# Patient Record
Sex: Female | Born: 1980 | Race: White | Hispanic: No | Marital: Single | Smoking: Never smoker
Health system: Southern US, Community
[De-identification: ages and names within clinical notes are randomized; demographics above are authoritative.]

## PROBLEM LIST (undated history)

## (undated) DIAGNOSIS — R600 Localized edema: Secondary | ICD-10-CM

## (undated) DIAGNOSIS — E039 Hypothyroidism, unspecified: Secondary | ICD-10-CM

## (undated) DIAGNOSIS — D649 Anemia, unspecified: Secondary | ICD-10-CM

## (undated) DIAGNOSIS — M549 Dorsalgia, unspecified: Secondary | ICD-10-CM

## (undated) DIAGNOSIS — E538 Deficiency of other specified B group vitamins: Secondary | ICD-10-CM

## (undated) DIAGNOSIS — E559 Vitamin D deficiency, unspecified: Secondary | ICD-10-CM

## (undated) HISTORY — DX: Dorsalgia, unspecified: M54.9

## (undated) HISTORY — PX: OTHER SURGICAL HISTORY: SHX169

## (undated) HISTORY — DX: Deficiency of other specified B group vitamins: E53.8

## (undated) HISTORY — DX: Vitamin D deficiency, unspecified: E55.9

## (undated) HISTORY — DX: Anemia, unspecified: D64.9

## (undated) HISTORY — DX: Hypothyroidism, unspecified: E03.9

## (undated) HISTORY — DX: Localized edema: R60.0

---

## 2002-08-18 ENCOUNTER — Emergency Department (HOSPITAL_COMMUNITY): Admission: EM | Admit: 2002-08-18 | Discharge: 2002-08-18 | Payer: Self-pay

## 2002-08-30 ENCOUNTER — Encounter: Payer: Self-pay | Admitting: Family Medicine

## 2002-08-30 ENCOUNTER — Ambulatory Visit (HOSPITAL_COMMUNITY): Admission: RE | Admit: 2002-08-30 | Discharge: 2002-08-30 | Payer: Self-pay | Admitting: Family Medicine

## 2005-06-23 ENCOUNTER — Encounter (INDEPENDENT_AMBULATORY_CARE_PROVIDER_SITE_OTHER): Payer: Self-pay | Admitting: Specialist

## 2005-06-23 ENCOUNTER — Other Ambulatory Visit: Admission: RE | Admit: 2005-06-23 | Discharge: 2005-06-23 | Payer: Self-pay | Admitting: Interventional Radiology

## 2005-06-23 ENCOUNTER — Encounter: Admission: RE | Admit: 2005-06-23 | Discharge: 2005-06-23 | Payer: Self-pay | Admitting: General Surgery

## 2006-03-18 ENCOUNTER — Ambulatory Visit (HOSPITAL_COMMUNITY): Admission: RE | Admit: 2006-03-18 | Discharge: 2006-03-19 | Payer: Self-pay | Admitting: General Surgery

## 2006-03-18 ENCOUNTER — Encounter (INDEPENDENT_AMBULATORY_CARE_PROVIDER_SITE_OTHER): Payer: Self-pay | Admitting: *Deleted

## 2007-10-29 IMAGING — CR DG CHEST 2V
2 series · 2 of 2 positions shown · non-contrast
Comparison: None.

CLINICAL DATA: Pre-op for thyroid surgery. 
 CHEST - 2 VIEW:

[view not recorded (1 of 2)]
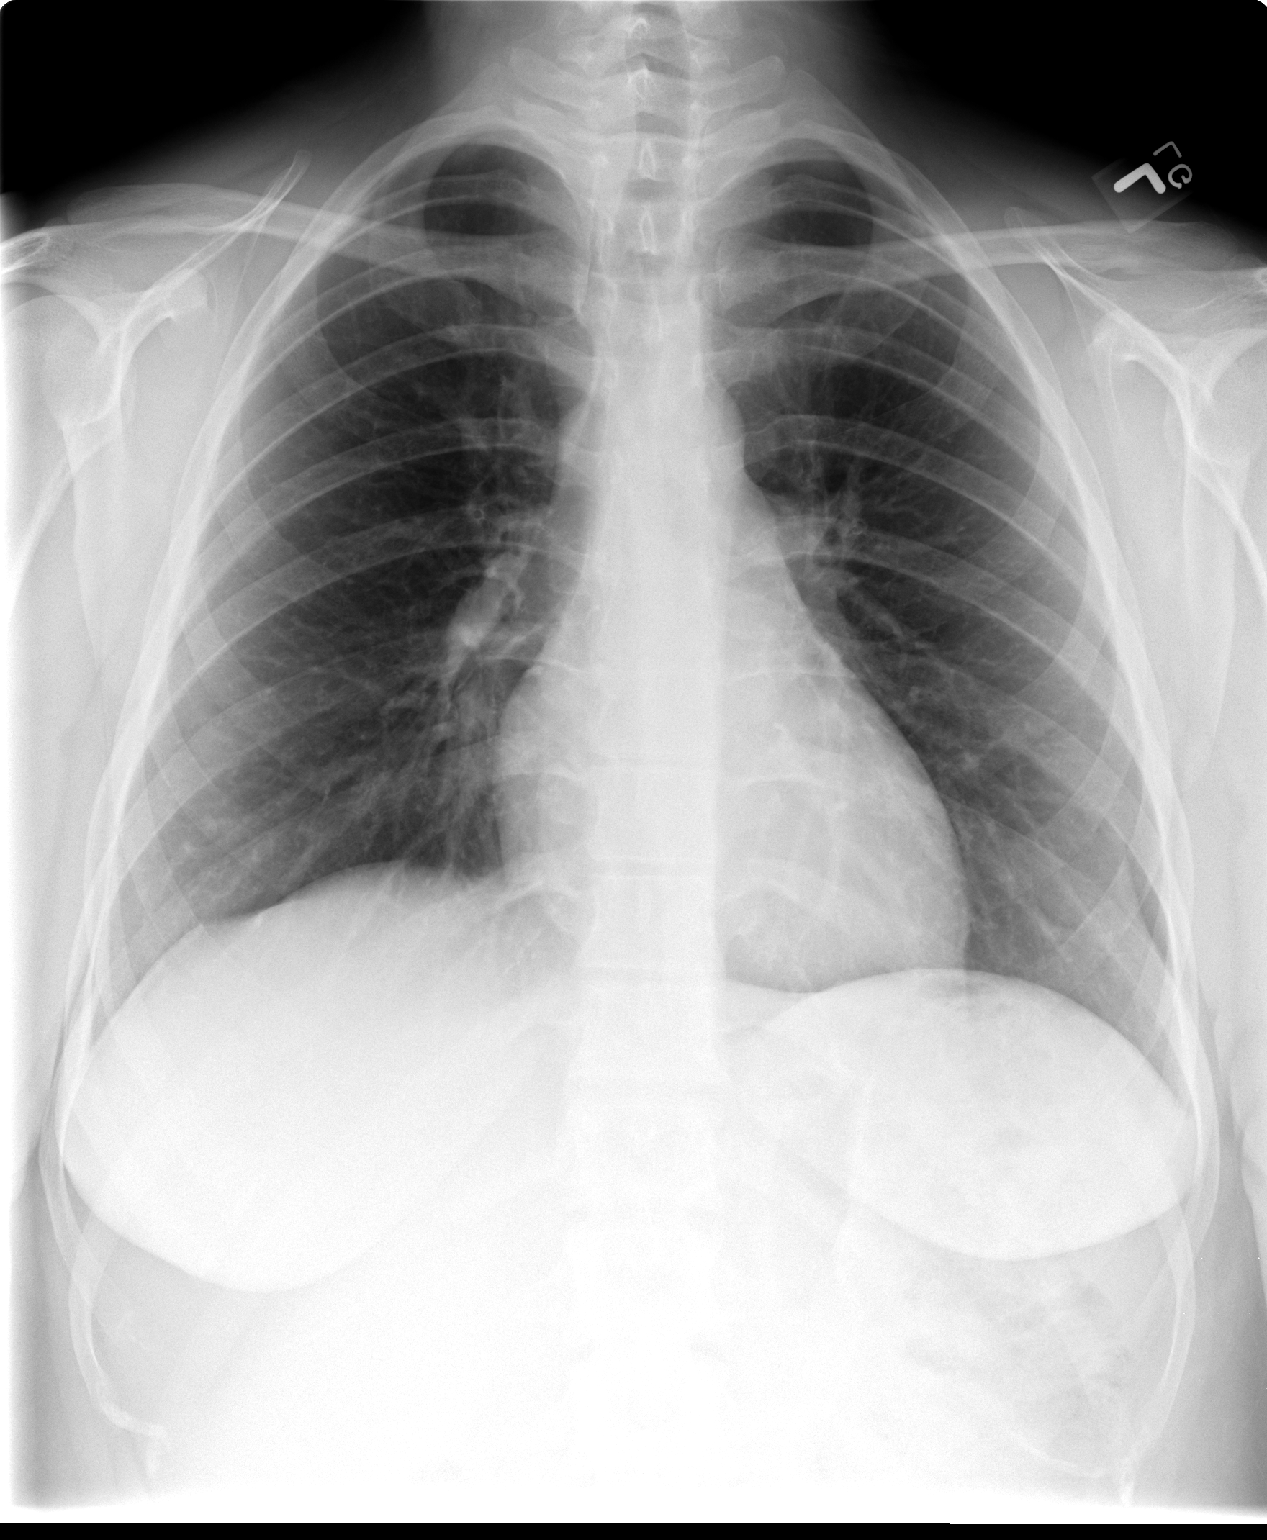

[view not recorded (2 of 2)]
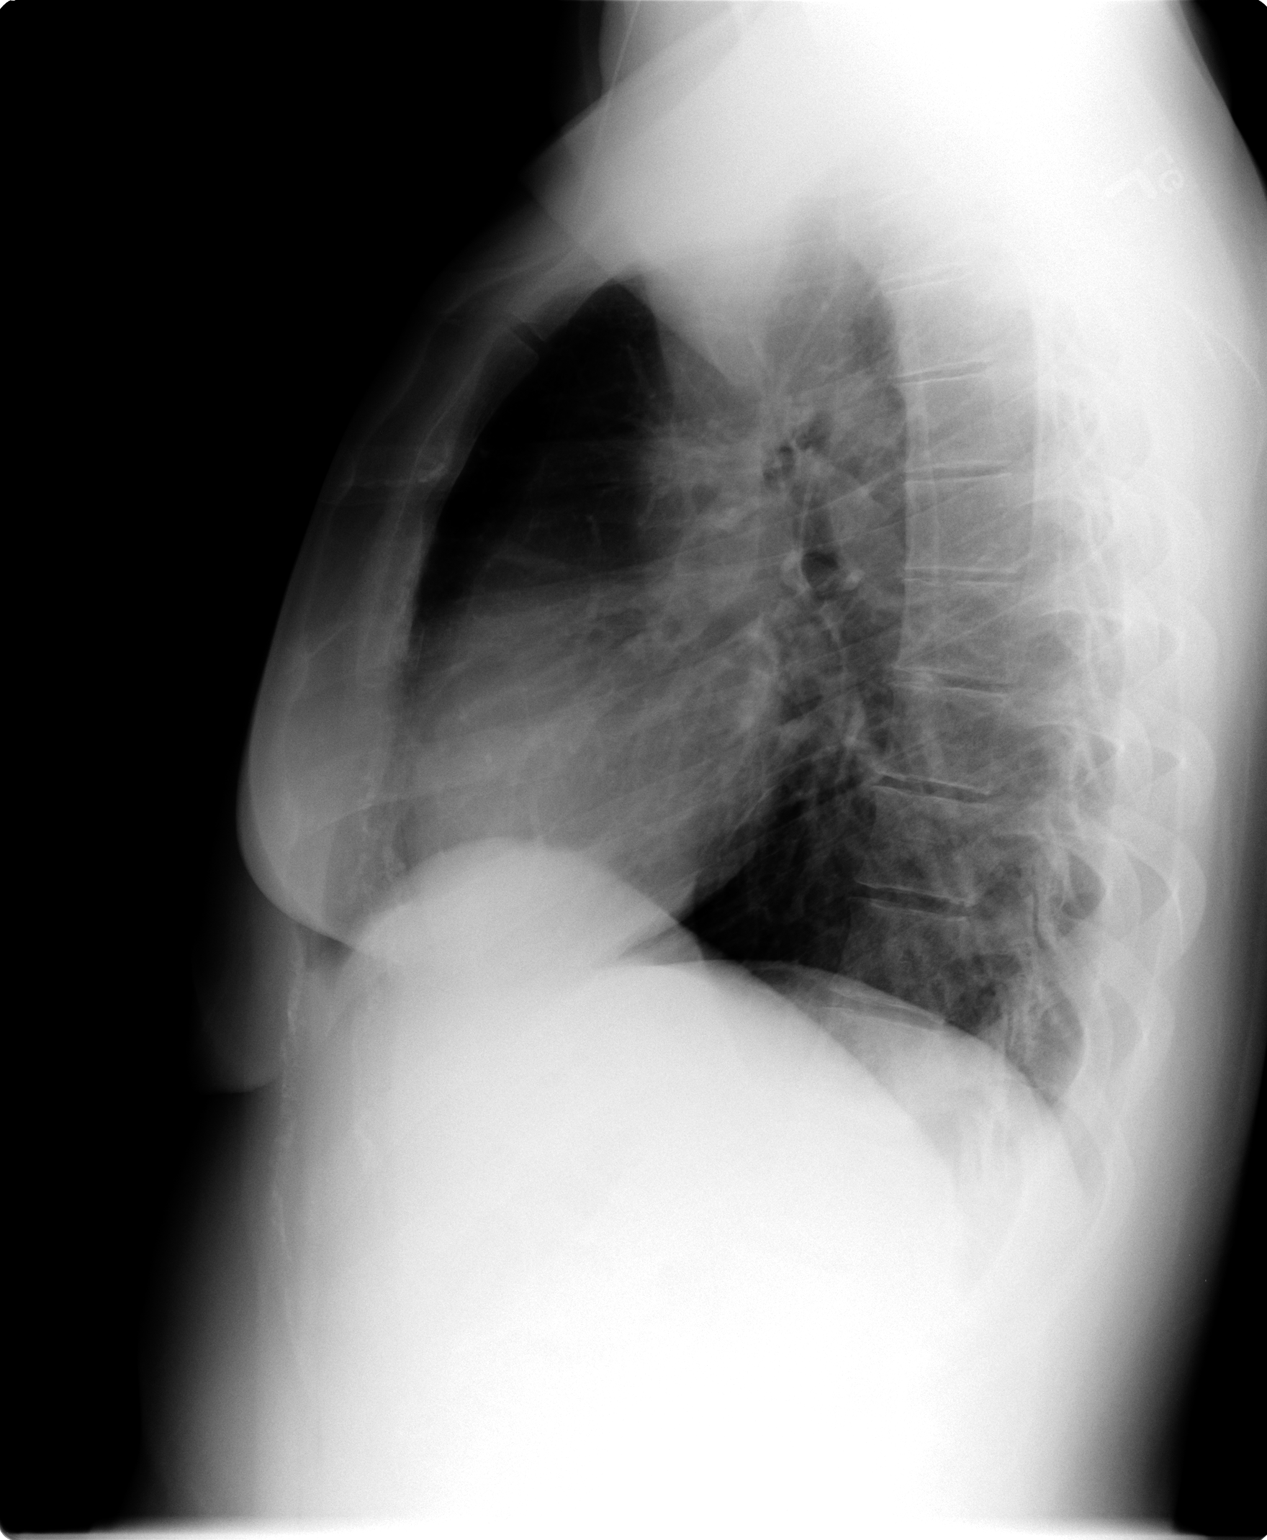

[2 of 2 positions shown; findings below may reference images not displayed]

FINDINGS: The heart size and mediastinal contours are within normal limits.  Both lungs are clear.  The visualized skeletal structures are unremarkable.
IMPRESSION: No active cardiopulmonary disease.

## 2008-03-21 ENCOUNTER — Encounter (HOSPITAL_COMMUNITY): Admission: RE | Admit: 2008-03-21 | Discharge: 2008-05-14 | Payer: Self-pay | Admitting: Family Medicine

## 2008-03-25 ENCOUNTER — Emergency Department (HOSPITAL_COMMUNITY): Admission: EM | Admit: 2008-03-25 | Discharge: 2008-03-25 | Payer: Self-pay | Admitting: Emergency Medicine

## 2010-12-31 ENCOUNTER — Encounter (HOSPITAL_COMMUNITY): Payer: BC Managed Care – PPO | Attending: Family Medicine

## 2010-12-31 DIAGNOSIS — D509 Iron deficiency anemia, unspecified: Secondary | ICD-10-CM | POA: Insufficient documentation

## 2011-02-07 NOTE — Op Note (Signed)
NAME:  Isabella Larson, KNISLEY NO.:  [id number]   MEDICAL RECORD NO.:  [id number]          PATIENT TYPE:  OIB   LOCATION:  1302                         FACILITY:  [facility]   PHYSICIAN:  Ollen Gross. Vernell Morgans, M.D. DATE OF BIRTH:  1981/05/03   DATE OF PROCEDURE:  03/18/2006  DATE OF DISCHARGE:  03/19/2006                                 OPERATIVE REPORT   PREOPERATIVE DIAGNOSIS:  Bilateral thyroid nodules.   POSTOPERATIVE DIAGNOSIS:  Bilateral thyroid nodules.   PROCEDURE:  Total thyroidectomy.   SURGEON:  Dr. Chevis Pretty.   ASSISTANT:  Dr. Harriette Bouillon.   ANESTHESIA:  General endotracheal.   PROCEDURE:  After informed consent was obtained, the patient was brought to  the operating room, placed in the supine position on the operating room  table.  After adequate induction of general anesthesia, a roll was placed  behind the patient's shoulders to extend the neck slightly, the patient's  neck and chest area were then prepped with Betadine and draped in the usual  sterile manner.  A low transverse incision was then made approximately two  fingerbreadth's above the sternal notch.  This incision was carried down  through the skin, subcutaneous tissue and platysma sharply with  electrocautery.  The platysma was then grasped with Allis clamps, both  superiorly and inferiorly and subplatysmal flaps were created.  Once this  was accomplished, the Mahorner retractor was deployed, dissection was then  carried down along the midline in between the strap muscles until the  trachea and thyroid gland were identified.  The patient had a very large  nodule on the right thyroid gland that was displacing the trachea to the  left. The strap muscles were then retracted laterally and attention was  first turned to the right side. As a strap muscles were retracted laterally,  blunt dissection on the anterior surface of the thyroid gland was done with  a Kitner. The edge of the thyroid gland was  then gradually rolled anteriorly  and medially. As this was done, the small vessels on the lateral aspect of  thyroid gland were controlled with small vascular clips and divided.  This  allowed the lateral edge of the thyroid gland be rolled up and over. Next  attention was turned to the superior pole of the thyroid gland. The superior  pole was dissected out bluntly with a small right-angle clamp. The superior  pole of the thyroid gland was controlled with 2-0 silk ties and a medium  clip on the stay side.  This was done and in approximately 2or 3 different  bites. Once this was accomplished, the superior pole was able to be divided  and brought down inferiorly. As the lateral edge of the thyroid gland was  then rolled up out over medially, at least one of the parathyroid glands on  the right side was identified and dissection was carried out to spare this  parathyroid gland. The recurrent laryngeal nerve on the right was also  identified and care was taken to keep this away from the dissection. Once  the section had rolled the thyroid  gland away from the recurrent laryngeal  nerve, the thyroid gland was then dissected off the trachea sharply with  electrocautery. At this point, the operative bed was packed with gauze and  attention was then turned to the left thyroid gland. Again the strap muscles  were retracted laterally with a [employer] retractor.  Blunt dissection was  carried out on the anterior surface of the thyroid gland.  At the edge of  the thyroid gland, several small vessels were identified and controlled with  small vascular clips.  This allowed the edge of thyroid gland to be rolled  anteriorly and medially. The dissection was carried out on the capsule of  the thyroid gland. The superior pole the left lobe was also dissected out  bluntly with a right-angle clamp and controlled with several passes of 2-0  silk ties and medium vascular clips on the stay side.  Once this was   accomplished, the superior pole was able to be divided and brought  inferiorly. As the gland was also rolled more medially, the recurrent  laryngeal nerve on the left was identified and care was taken to keep the  dissection away from this.  Once the thyroid gland was rolled medially off  and away from the recurrent laryngeal nerve, the thyroid gland was dissected  off the trachea sharply with electrocautery and this freed up the thyroid  gland and the thyroid gland was able to be removed. It was marked with a  stitch on the right superior pole and sent to pathology. The operative beds  were examined again and found to be hemostatic and the wound was irrigated  with copious amounts of saline. The area of the parathyroid glands was  identified but no parathyroid glands were seen since the dissection was  carried out on the capsule of the thyroid gland.  It was felt that these  parathyroid glands were spared. Two small pieces of Surgicel were placed on  each side in the operative bed. The strap muscles were then reapproximated  along the midline with interrupted 3-0 Vicryl stitches.  This platysma was  also reapproximated with interrupted 3-0 Vicryl stitches and the skin was  closed with a running 4-0 Monocryl subcuticular stitch.  Benzoin, Steri-  Strips and sterile dressings were applied.  The patient tolerated the  procedure well. At the end of the case, all needle, sponge and instrument  counts were correct.  The patient was then awakened and taken to recovery in  stable condition.      Ollen Gross. Vernell Morgans, M.D.  Electronically Signed     PST/MEDQ  D:  03/20/2006  T:  03/21/2006  Job:  [id number]

## 2013-07-22 ENCOUNTER — Other Ambulatory Visit: Payer: Self-pay | Admitting: Family Medicine

## 2013-07-28 ENCOUNTER — Ambulatory Visit (HOSPITAL_COMMUNITY)
Admission: RE | Admit: 2013-07-28 | Discharge: 2013-07-28 | Disposition: A | Discharge status: Home / Self Care | Payer: BC Managed Care – PPO | Source: Ambulatory Visit | Attending: Family Medicine | Admitting: Family Medicine

## 2013-07-28 DIAGNOSIS — D649 Anemia, unspecified: Secondary | ICD-10-CM | POA: Insufficient documentation

## 2013-07-28 MED ORDER — SODIUM CHLORIDE 0.9 % IV SOLN
25.0000 mg | Freq: Once | INTRAVENOUS | Status: AC
  Administered 2013-07-28: 25 mg via INTRAVENOUS
  Filled 2013-07-28: qty 0.5

## 2013-07-28 MED ORDER — SODIUM CHLORIDE 0.9 % IV SOLN
1000.0000 mg | Freq: Once | INTRAVENOUS | Status: AC
  Administered 2013-07-28: 1000 mg via INTRAVENOUS
  Filled 2013-07-28: qty 20

## 2013-08-31 ENCOUNTER — Telehealth: Payer: Self-pay | Admitting: Internal Medicine

## 2013-08-31 NOTE — Telephone Encounter (Signed)
LVOM FOR PT TO RETURN CALL IN RE TO REFERRAL.  °

## 2013-09-05 ENCOUNTER — Telehealth: Payer: Self-pay | Admitting: Oncology

## 2013-09-05 NOTE — Telephone Encounter (Signed)
S/W PT AND GVE NP APPT 12/23 @ 10:30 W/DR. LIVESAY REFERRING DR. Sharlot Gowda HARRIS DX- IDA WELCOME PACKET MAILED.

## 2013-09-05 NOTE — Telephone Encounter (Signed)
C/D 09/05/13 for appt. 09/13/13

## 2013-09-12 ENCOUNTER — Other Ambulatory Visit: Payer: Self-pay | Admitting: Oncology

## 2013-09-12 DIAGNOSIS — D509 Iron deficiency anemia, unspecified: Secondary | ICD-10-CM

## 2013-09-12 DIAGNOSIS — D649 Anemia, unspecified: Secondary | ICD-10-CM

## 2013-09-13 ENCOUNTER — Telehealth: Payer: Self-pay | Admitting: Oncology

## 2013-09-13 ENCOUNTER — Other Ambulatory Visit (HOSPITAL_BASED_OUTPATIENT_CLINIC_OR_DEPARTMENT_OTHER): Payer: BC Managed Care – PPO

## 2013-09-13 ENCOUNTER — Ambulatory Visit (HOSPITAL_BASED_OUTPATIENT_CLINIC_OR_DEPARTMENT_OTHER): Payer: BC Managed Care – PPO | Admitting: Oncology

## 2013-09-13 ENCOUNTER — Ambulatory Visit: Payer: BC Managed Care – PPO

## 2013-09-13 VITALS — BP 118/81 | HR 66 | Temp 97.9°F | Resp 19 | Ht 69.0 in | Wt 246.0 lb

## 2013-09-13 DIAGNOSIS — Z803 Family history of malignant neoplasm of breast: Secondary | ICD-10-CM

## 2013-09-13 DIAGNOSIS — Z9884 Bariatric surgery status: Secondary | ICD-10-CM

## 2013-09-13 DIAGNOSIS — D509 Iron deficiency anemia, unspecified: Secondary | ICD-10-CM

## 2013-09-13 DIAGNOSIS — D508 Other iron deficiency anemias: Secondary | ICD-10-CM | POA: Insufficient documentation

## 2013-09-13 DIAGNOSIS — D649 Anemia, unspecified: Secondary | ICD-10-CM

## 2013-09-13 DIAGNOSIS — E538 Deficiency of other specified B group vitamins: Secondary | ICD-10-CM | POA: Insufficient documentation

## 2013-09-13 LAB — CBC & DIFF AND RETIC
BASO%: 0.4 % (ref 0.0–2.0)
Basophils Absolute: 0 10*3/uL (ref 0.0–0.1)
EOS%: 1.2 % (ref 0.0–7.0)
HCT: 35.3 % (ref 34.8–46.6)
HGB: 11 g/dL — ABNORMAL LOW (ref 11.6–15.9)
LYMPH%: 33.2 % (ref 14.0–49.7)
MCH: 25.9 pg (ref 25.1–34.0)
MCHC: 31.2 g/dL — ABNORMAL LOW (ref 31.5–36.0)
MCV: 83.1 fL (ref 79.5–101.0)
NEUT%: 49 % (ref 38.4–76.8)
Platelets: 246 10*3/uL (ref 145–400)
RDW: 20.1 % — ABNORMAL HIGH (ref 11.2–14.5)
Retic Ct Abs: 36.13 10*3/uL (ref 33.70–90.70)
lymph#: 0.8 10*3/uL — ABNORMAL LOW (ref 0.9–3.3)

## 2013-09-13 LAB — MORPHOLOGY

## 2013-09-13 LAB — FOLATE: Folate: >20 ng/mL

## 2013-09-13 NOTE — Patient Instructions (Signed)
Call if questions or problems prior to next visit, including worse fatigue or fever.  [phone]   Hemocyte (ferrous fumarate) prescription will go to [facility]. Take one hour before or 2 hrs after eating, on empty stomach with OJ best

## 2013-09-13 NOTE — Progress Notes (Signed)
[facility] NEW PATIENT EVALUATION   Name: Isabella Larson Date: 09/13/2013 MRN: [id number] DOB: Oct 21, 1980  REFERRING PHYSICIAN: Johny Blamer, MD Cc Huel Cote PA (nutritionist at [facility])   REASON FOR REFERRAL: iron deficiency anemia   HISTORY OF PRESENT ILLNESS:Nicholette JAHLIYAH Larson is a 32 y.o. female who is seen in consultation, alone for visit today, at the request of  PCP Dr Holley Bouche, for recurrent iron deficiency anemia. History is from Dr Tiburcio Pea' office information, which I have reviewed and which will be scanned into this EMR, and from patient now.   She was first found to be iron deficient several years after gastric bypass, with IV iron given ~ 4-5 years ago, again ~ 2-3 years ago and most recently IV iron dextran (Infed) 1000 mg on 07-28-2013. Only known bleeding is regular menses, which do not seem excessively heavy per patient. PMH is significant for gastric bypass surgery for obesity in 2005; she has been on monthly B12 injections for past 5-6 years, which she understands that she will need to continue indefinitely. She has been unable to tolerate oral iron, probably as ferrous sulfate, in past due to GI upset.  Patient previously felt extremely fatigued when she had significant iron deficiency anemia, which improved after IV iron administrations. 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 She describes the fatigue as if she could fall asleep while standing and teaching; she does not notice SOB/ DOE or chest pain or extremity symptoms.  She saw Dr Tiburcio Pea in Oct 2014, with hemoglobin 9.5, MCV 72, ferritin 4 and %sat 12. She has felt minimally less fatigued since the IV iron last month, with repeat CBC by Dr Tiburcio Pea 08-24-13 showing Hgb up to 11.6, WBC 8.2, plt 220k, MCV 79.6 and ferritin 45.  Other labs from Dr Tiburcio Pea' records include  normal CMET 07-05-13 including electrolytes, LFTs, creatinine 0.56, Tprot 6.6, alb 4.1; TSH 1.12 on 07-05-13. In 12-2010 Hgb was 6.6 with WBC 4.7, plt 289 and MCV 62; in 02-2012 Hgb 12, WBC 6.9, MCV 89.9 and normal automated differential. No urinalysis or hemoccults found in referring information.  She denies any recent infectious illness; last severe illness "strep throat + pink eye" 2 years ago. She usually gets 6-7 hours sleep at night, tho prefers 8 hours.She does not exercise regularly now tho she had walked regularly earlier this year, has been working with dietician and has intentionally lost 48 lbs since Jan 2014.    REVIEW OF SYSTEMS as above, also: No HA or other neurologic symptoms. Good visual acuity with glasses. No recent severe sore throat or flu symptoms. No difficulty hearing, no environmental allergies, no active dental problems. No respiratory symptoms, no changes in breasts, no cardiac symptoms. Occasional GERD since gastric bypass. Bowels moving normally. After bypass surgery she initially lost from 350 lbs to 190 lbs, then had been gaining again until working with nutritionist now Isabella Larson [place]). Mild psoriasis on hands intermittently x years. Some arthritis symptoms in knees. No bladder symptoms or hematuria. No LE swelling. No pain.  Remainder of full 10 point review of systems negative.   ALLERGIES: Review of patient's allergies indicates no known allergies.  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 B12 deficiency related to gastric bypass, on monthly B12 injecions Psoriasis on hands intermittently Never transfused Never mammograms Never colonoscopy G0 Overdue gyn exam, is to see Dr Huel Cote upcoming (that MD sees family members, may not  have seen [name] previously) Never transfused Declined flu vaccine Has not been blood donor  CURRENT MEDICATIONS: reviewed as listed now in EMR. We have discussed alternative oral iron preparations (ferrous fumarate and ferrous gluconate), which often are easier to tolerate than usual ferrous sulfate.   Pharmacy  [facility]  SOCIAL HISTORY:  Single, from [place], parents live locally. 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 No tobacco, no ETOH Her winter school vacation began today, back to school Jan 5. Her insurance does not cover the IV iron, and she had just finished paying off the previous infusion when she had to have this done again in Nov 2014.  FAMILY HISTORY:  Mother with breast cancer ~ age 23, treated at [facility], doing well Father healthy No siblings (?or a brother?) No children No known hematologic problems in family         PHYSICAL EXAM:  height is 5\' 9"  (1.753 m) and weight is 246 lb (111.585 kg). Her oral temperature is 97.9 F (36.6 C). Her blood pressure is 118/81 and her pulse is 66. Her respiration is 19.  Alert, fully oriented and appropriate, very pleasant lady looks stated age, neatly groomed, obese. She does not appear in any discomfort.  HEENT: PERRL, not icteric. Oral mucosa moist and clear, mucous membranes not obviously pale, no lesions. Neck supple. Well healed scar from thyroidectomy.  RESPIRATORY: respirations not labored RA, clear to A and P  CARDIAC/ VASCULAR: RRR, no murmur or gallop, clear heart sounds.  ABDOMEN: obese, soft, not tender, no appreciable mass or HSM. Normal bowel sounds. Not tender in epigastrium.  LYMPH NODES: no cervical, supraclavicular, axillary or inguinal adenopathy  NEUROLOGIC: CN, motor, sensory, cerebellar nonfocal  SKIN: no rash, ecchymoses, petechiae  MUSCULOSKELETAL: LE without pitting edema, cords, tenderness. Back nontender    LABORATORY DATA:  Results for orders placed in visit  on 09/13/13 (from the past 48 hour(s))  CBC & DIFF AND RETIC     Status: Abnormal   Collection Time    09/13/13 10:33 AM      Result Value Range   WBC 2.5 (*) 3.9 - 10.3 10e3/uL   NEUT# 1.2 (*) 1.5 - 6.5 10e3/uL   HGB 11.0 (*) 11.6 - 15.9 g/dL   HCT 82.9  56.2 - 13.0 %   Platelets 246  145 - 400 10e3/uL   MCV 83.1  79.5 - 101.0 fL   MCH 25.9  25.1 - 34.0 pg   MCHC 31.2 (*) 31.5 - 36.0 g/dL   RBC 8.65  7.84 - 6.96 10e6/uL   RDW 20.1 (*) 11.2 - 14.5 %   lymph# 0.8 (*) 0.9 - 3.3 10e3/uL   MONO# 0.4  0.1 - 0.9 10e3/uL   Eosinophils Absolute 0.0  0.0 - 0.5 10e3/uL   Basophils Absolute 0.0  0.0 - 0.1 10e3/uL   NEUT% 49.0  38.4 - 76.8 %   LYMPH% 33.2  14.0 - 49.7 %   MONO% 16.2 (*) 0.0 - 14.0 %   EOS% 1.2  0.0 - 7.0 %   BASO% 0.4  0.0 - 2.0 %   nRBC 0  0 - 0 %   Retic % 0.85  0.70 - 2.10 %   Retic Ct Abs 36.13  33.70 - 90.70 10e3/uL   Immature Retic Fract 3.80  1.60 - 10.00 %  MORPHOLOGY     Status: None   Collection Time    09/13/13 10:33 AM      Result Value Range   Ovalocytes Moderate  Negative   White Cell Comments Variant Lymphs     PLT EST Adequate  Adequate   Platelet Morphology Large Platelets  Within Normal Limits      Peripheral smear reviewed by MD particularly due to WBC 2.5, ANC 1.2 and manual morphology as noted. Monocytes seen, variant lymphs seen, no immature cells, some platelets may be generous size but not giant.  B12 available after visit 1161 and folate 20  PATHOLOGY: none   RADIOGRAPHY: No results found.     DISCUSSION: we have discussed documented iron deficiency and mechanisms of this, in her case likely related to difficulty with absorption from the gastric bypass and chronic blood loss from regular menstrual periods. I do not know if she will have benefit from oral iron, but have suggested that she could try ferrous fumarate (as Hemocyte if insurance will cover that), with optimal absorption generally taking this an hour before or 2 hours after  meals, with OJ or vitamin C. I will check UA at next visit and would consider hemoccults especially if any suggestion of ulcers, as marginal ulcers can be complication of gastric bypass surgery. She should have CBC followed every 6-12 months (by PCP or gyn fine) so as not to get markedly iron deficient before this is noticed in future.  Other concern on CBC today is leukopenia with some variant lymphocytes and increased monocytes, tho she is not quite neutropenic. This is obviously different from recent labs by Dr Tiburcio Pea and would not be related to iron deficiency or the IV iron dextran, or her other medications. I wonder if she might be starting some viral illness, and have instructed her to call here or PCP if fever or other symptoms. I will see her back in ~ 10 days with repeat blood counts, timing of that visit to be just prior to her return to school.  We have also discussed fact that 6-7 hours sleep nightly may not be sufficient, and I have encouraged her to take advantage of present vacation to try extra sleep.     IMPRESSION / PLAN:  1. Iron deficiency anemia: likely related to inadequate absorption post gastric bypass and chronic blood loss with menstrual bleeding; may also need to check urine and hemoccults. Post IV iron dextran most recently in Nov 2014, with some improvement in hemoglobin since then and reasonable reticulocyte response noted today. Try oral ferrous fumarate if able to tolerate, as this may give some benefit/ extent time to next IV iron needed. 2.Leukopenia with changes on differential as noted: etiology not clear and will repeat counts/ see her in follow up 09-23-12 or sooner if needed. 3.post gastric bypass 2005 4.B12 deficiency related to gastric bypass, on regular IM supplement 5.morbid obesity: intentional weight loss now with assistance of nutritionist.  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 They can contact this office for questions or concerns at  any time prior to next scheduled visit.  Time spent  40 min , including >50% counseling and coordination of care.    Randall Colden P, MD 09/13/2013 12:50 PM

## 2013-09-18 ENCOUNTER — Encounter: Payer: Self-pay | Admitting: Oncology

## 2013-09-23 ENCOUNTER — Encounter: Payer: Self-pay | Admitting: Oncology

## 2013-09-23 ENCOUNTER — Ambulatory Visit (HOSPITAL_BASED_OUTPATIENT_CLINIC_OR_DEPARTMENT_OTHER): Payer: BC Managed Care – PPO | Admitting: Oncology

## 2013-09-23 ENCOUNTER — Other Ambulatory Visit (HOSPITAL_BASED_OUTPATIENT_CLINIC_OR_DEPARTMENT_OTHER): Payer: BC Managed Care – PPO

## 2013-09-23 VITALS — BP 138/84 | HR 76 | Temp 97.7°F | Resp 18 | Ht 69.0 in | Wt 245.3 lb

## 2013-09-23 DIAGNOSIS — D509 Iron deficiency anemia, unspecified: Secondary | ICD-10-CM

## 2013-09-23 DIAGNOSIS — Z9884 Bariatric surgery status: Secondary | ICD-10-CM

## 2013-09-23 LAB — COMPREHENSIVE METABOLIC PANEL (CC13)
ALBUMIN: 3.6 g/dL (ref 3.5–5.0)
ALT: 20 U/L (ref 0–55)
AST: 18 U/L (ref 5–34)
Alkaline Phosphatase: 87 U/L (ref 40–150)
Anion Gap: 11 mEq/L (ref 3–11)
BUN: 11.6 mg/dL (ref 7.0–26.0)
CALCIUM: 8.8 mg/dL (ref 8.4–10.4)
CHLORIDE: 108 meq/L (ref 98–109)
CO2: 23 meq/L (ref 22–29)
Creatinine: 0.7 mg/dL (ref 0.6–1.1)
GLUCOSE: 97 mg/dL (ref 70–140)
POTASSIUM: 3.7 meq/L (ref 3.5–5.1)
SODIUM: 141 meq/L (ref 136–145)
TOTAL PROTEIN: 6.8 g/dL (ref 6.4–8.3)
Total Bilirubin: 0.38 mg/dL (ref 0.20–1.20)

## 2013-09-23 LAB — MORPHOLOGY: PLT EST: ADEQUATE

## 2013-09-23 LAB — CBC & DIFF AND RETIC
BASO%: 0.2 % (ref 0.0–2.0)
Basophils Absolute: 0 10*3/uL (ref 0.0–0.1)
EOS ABS: 0.1 10*3/uL (ref 0.0–0.5)
EOS%: 0.9 % (ref 0.0–7.0)
HCT: 36.3 % (ref 34.8–46.6)
HGB: 11.5 g/dL — ABNORMAL LOW (ref 11.6–15.9)
IMMATURE RETIC FRACT: 6 % (ref 1.60–10.00)
LYMPH%: 39.6 % (ref 14.0–49.7)
MCH: 26 pg (ref 25.1–34.0)
MCHC: 31.7 g/dL (ref 31.5–36.0)
MCV: 82.1 fL (ref 79.5–101.0)
MONO#: 0.4 10*3/uL (ref 0.1–0.9)
MONO%: 7.3 % (ref 0.0–14.0)
NEUT%: 52 % (ref 38.4–76.8)
NEUTROS ABS: 2.8 10*3/uL (ref 1.5–6.5)
NRBC: 0 % (ref 0–0)
Platelets: 214 10*3/uL (ref 145–400)
RBC: 4.42 10*6/uL (ref 3.70–5.45)
RDW: 19 % — AB (ref 11.2–14.5)
RETIC %: 0.78 % (ref 0.70–2.10)
RETIC CT ABS: 34.48 10*3/uL (ref 33.70–90.70)
WBC: 5.3 10*3/uL (ref 3.9–10.3)
lymph#: 2.1 10*3/uL (ref 0.9–3.3)

## 2013-09-23 MED ORDER — FERROUS FUMARATE 325 (106 FE) MG PO TABS: 1.0000 | ORAL_TABLET | Freq: Every day | ORAL | Status: DC

## 2013-09-23 NOTE — Patient Instructions (Signed)
Continue Hemocyte (ferrous fumarate) one tablet on empty stomach with OJ daily or at least several times weekly if you tolerate that.  Have hemoglobin checked ~ every 6 months to be sure you are not drifting down -- you could do this at gyn visit in summer and with Dr Tiburcio PeaHarris in Dec, or possibly the nutritionist can [facility]  Dr Darrold SpanLivesay is glad to see you at any time if you or other health care providers feel that she can help

## 2013-09-23 NOTE — Progress Notes (Signed)
OFFICE PROGRESS NOTE   09/23/2013   Physicians: Deatra James  INTERVAL HISTORY:  Patient is seen, alone for visit, in fokllow up iron deficiency anemia, also with new leukopenia when seen in consultation on 09-13-13. At time of the consult, there was concern that she might be developing a viral illness to account for the new leukopenia, however she has not subsequently had any symptoms of infection. She has slept more than her usual, which seems to have improved the fatigue. She has been able to tolerate oral iron as Hemocyte (ferrous fumarate, which her insurance covers with just copay, which is less out of pocket than OTC ferrous fumarate), which she is taking on empty stomach with OJ daily; she had slight nausea with first dose but since then no problems with the oral iron. She understands that she may absorb very little if any of the oral iron, however if she does get some benefit, this may delay time to next IV iron needed.    HEMATOLOGY HISTORY She was first found to be iron deficient several years after gastric bypass, with IV iron given ~ 4-5 years ago, again ~ 2-3 years ago and most recently IV iron dextran (Infed) 1000 mg on 07-28-2013. Only known bleeding is regular menses, which do not seem excessively heavy per patient. She is overdue gyn exam, plans to do that on summer break. PMH is significant for gastric bypass surgery for obesity in 2005; she has been on monthly B12 injections for past 5-6 years, which she understands that she will need to continue indefinitely. She has been unable to tolerate oral iron, probably as ferrous sulfate, in past due to GI upset.  Patient previously felt extremely fatigued when she had significant iron deficiency anemia, which improved after IV iron administrations. 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 She describes  the fatigue as if she could fall asleep while standing and teaching; she does not notice SOB/ DOE or chest pain or extremity symptoms. She saw Dr Tiburcio Pea in Oct 2014, with hemoglobin 9.5, MCV 72, ferritin 4 and %sat 12. She has felt minimally less fatigued since the IV iron last month, with repeat CBC by Dr Tiburcio Pea 08-24-13 showing Hgb up to 11.6, WBC 8.2, plt 220k, MCV 79.6 and ferritin 45.  Other labs from Dr Tiburcio Pea' records include normal CMET 07-05-13 including electrolytes, LFTs, creatinine 0.56, Tprot 6.6, alb 4.1; TSH 1.12 on 07-05-13. In 12-2010 Hgb was 6.6 with WBC 4.7, plt 289 and MCV 62; in 02-2012 Hgb 12, WBC 6.9, MCV 89.9 and normal automated differential. No urinalysis or hemoccults found in referring information.     Review of systems as above, also: No fever, sweats, pain, bleeding Remainder of 10 point Review of Systems negative.  Objective:  Vital signs in last 24 hours:  BP 138/84  Pulse 76  Temp(Src) 97.7 F (36.5 C) (Oral)  Resp 18  Ht 5\' 9"  (1.753 m)  Wt 245 lb 5 oz (111.273 kg)  BMI 36.21 kg/m2 Weight is down 1 lb Alert, oriented and appropriate. Ambulatory without difficulty.    HEENT:PERRL, sclerae not icteric. Oral mucosa moist without lesions, posterior pharynx clear.  Neck supple. No JVD.  Lymphatics:no cervical,suraclavicular adenopathy Resp: clear to auscultation bilaterally and normal percussion bilaterally Cardio: regular rate and rhythm. No gallop. GI: obese, soft, nontender. Normally active bowel sounds.  Musculoskeletal/ Extremities:  without pitting edema, cords, tenderness Neuro: no peripheral neuropathy. Otherwise nonfocal Skin without rash, ecchymosis, petechiae   Lab Results:  Results for orders placed in visit on 09/23/13  CBC & DIFF AND RETIC      Result Value Range   WBC 5.3  3.9 - 10.3 10e3/uL   NEUT# 2.8  1.5 - 6.5 10e3/uL   HGB 11.5 (*) 11.6 - 15.9 g/dL   HCT 16.136.3  09.634.8 - 04.546.6 %   Platelets 214  145 - 400 10e3/uL   MCV 82.1  79.5  - 101.0 fL   MCH 26.0  25.1 - 34.0 pg   MCHC 31.7  31.5 - 36.0 g/dL   RBC 4.094.42  8.113.70 - 9.145.45 10e6/uL   RDW 19.0 (*) 11.2 - 14.5 %   lymph# 2.1  0.9 - 3.3 10e3/uL   MONO# 0.4  0.1 - 0.9 10e3/uL   Eosinophils Absolute 0.1  0.0 - 0.5 10e3/uL   Basophils Absolute 0.0  0.0 - 0.1 10e3/uL   NEUT% 52.0  38.4 - 76.8 %   LYMPH% 39.6  14.0 - 49.7 %   MONO% 7.3  0.0 - 14.0 %   EOS% 0.9  0.0 - 7.0 %   BASO% 0.2  0.0 - 2.0 %   nRBC 0  0 - 0 %   Retic % 0.78  0.70 - 2.10 %   Retic Ct Abs 34.48  33.70 - 90.70 10e3/uL   Immature Retic Fract 6.00  1.60 - 10.00 %  MORPHOLOGY      Result Value Range   Polychromasia Slight  Slight   Ovalocytes Few  Negative   Target Cells Few  Negative   RBC Comments Rare NRBCs  Within Normal Limits   White Cell Comments Variant Lymphs     PLT EST Adequate  Adequate   Platelet Morphology Large Platelets  Within Normal Limits  COMPREHENSIVE METABOLIC PANEL (CC13)      Result Value Range   Sodium 141  136 - 145 mEq/L   Potassium 3.7  3.5 - 5.1 mEq/L   Chloride 108  98 - 109 mEq/L   CO2 23  22 - 29 mEq/L   Glucose 97  70 - 140 mg/dl   BUN 78.211.6  7.0 - 95.626.0 mg/dL   Creatinine 0.7  0.6 - 1.1 mg/dL   Total Bilirubin 2.130.38  0.20 - 1.20 mg/dL   Alkaline Phosphatase 87  40 - 150 U/L   AST 18  5 - 34 U/L   ALT 20  0 - 55 U/L   Total Protein 6.8  6.4 - 8.3 g/dL   Albumin 3.6  3.5 - 5.0 g/dL   Calcium 8.8  8.4 - 08.610.4 mg/dL   Anion Gap 11  3 - 11 mEq/L    Peripheral smear reviewed by MD. No giant platelets and platelet morphology not obviously abnormal. Note WBC improved and hgb up 0.5 gm in past week.  Studies/Results:  No results found.  Medications: I have reviewed the patient's current medications. She will continue hemocyte daily or at least several times weekly as tolerated  DISCUSSION: Hemoglobin continues to increase as expected since recent IV iron; slight leukopenia last week resolved, etiology not clear but still may have been slight viral infection.  Tolerating oral ferrous fumarate as Hemocyte. I have recommended that she have hemoglobin checked by PCP or gyn or other every 6 months, as she still will likely need IV iron on some occasional basis due to the gastric bypass,  even with oral iron. I am glad to see her back at this office at any time if she or other physicians request.  Assessment/Plan:  1.iron deficiency anemia: due to poor absorption since gastric bypass and physiologic blood loss with menses. Plan as above.  2.mild leukopenia resolved 3.morbid obesity post gastric bypass 2005: working with nutritionist out of [facility] 4.on long term monthly B12 post gastric bypass 5.mild psoriasis stable   Patient had questions answered to her satisfaction and is in agreement with plan   Reece Packer, MD   09/23/2013, 1:26 PM

## 2013-09-24 ENCOUNTER — Encounter: Payer: Self-pay | Admitting: Oncology

## 2013-09-24 NOTE — Progress Notes (Unsigned)
Medical Oncology  [facility]    ElwinGreta O'Buch, [place].  My notes 09-13-13 and 09-23-13 routed to her. Ila McgillL.Janeal Abadi, MD

## 2016-12-18 ENCOUNTER — Encounter (INDEPENDENT_AMBULATORY_CARE_PROVIDER_SITE_OTHER): Payer: Self-pay

## 2016-12-18 ENCOUNTER — Ambulatory Visit (HOSPITAL_COMMUNITY)
Admission: RE | Admit: 2016-12-18 | Discharge: 2016-12-18 | Disposition: A | Discharge status: Home / Self Care | Payer: BLUE CROSS/BLUE SHIELD | Source: Ambulatory Visit | Attending: Family Medicine | Admitting: Family Medicine

## 2016-12-18 DIAGNOSIS — D508 Other iron deficiency anemias: Secondary | ICD-10-CM | POA: Insufficient documentation

## 2016-12-18 MED ORDER — SODIUM CHLORIDE 0.9 % IV SOLN
25.0000 mg | Freq: Once | INTRAVENOUS | Status: AC
  Administered 2016-12-18: 25 mg via INTRAVENOUS
  Filled 2016-12-18: qty 0.5

## 2016-12-18 MED ORDER — SODIUM CHLORIDE 0.9 % IV SOLN
2000.0000 mg | Freq: Once | INTRAVENOUS | Status: AC
  Administered 2016-12-18: 2000 mg via INTRAVENOUS
  Filled 2016-12-18: qty 40

## 2016-12-18 NOTE — Discharge Instructions (Signed)

## 2016-12-18 NOTE — Progress Notes (Signed)
Provider: Dossie ArbourHarris, W  Diagnosis: Iron Defiecny Anemia D50.8  Treatment: IV Iron Dextran via IVPB  Patient received IV Iron Dextran via IVPB over four hours Patienttolerated procedure well with no reaction. Patient given discharge instructions and states an understanding. Patient alert, oriented and ambulatory at time of discharge.

## 2017-09-28 ENCOUNTER — Encounter (INDEPENDENT_AMBULATORY_CARE_PROVIDER_SITE_OTHER): Payer: Self-pay | Admitting: Physician Assistant

## 2017-09-28 ENCOUNTER — Ambulatory Visit (INDEPENDENT_AMBULATORY_CARE_PROVIDER_SITE_OTHER): Payer: Self-pay

## 2017-09-28 ENCOUNTER — Ambulatory Visit (INDEPENDENT_AMBULATORY_CARE_PROVIDER_SITE_OTHER): Payer: BLUE CROSS/BLUE SHIELD | Admitting: Physician Assistant

## 2017-09-28 DIAGNOSIS — M7061 Trochanteric bursitis, right hip: Secondary | ICD-10-CM | POA: Diagnosis not present

## 2017-09-28 DIAGNOSIS — M7062 Trochanteric bursitis, left hip: Secondary | ICD-10-CM | POA: Diagnosis not present

## 2017-09-28 DIAGNOSIS — M545 Low back pain, unspecified: Secondary | ICD-10-CM

## 2017-09-28 MED ORDER — METHYLPREDNISOLONE 4 MG PO TABS: ORAL_TABLET | ORAL | 0 refills | Status: DC

## 2017-09-28 MED ORDER — CYCLOBENZAPRINE HCL 10 MG PO TABS: 10.0000 mg | ORAL_TABLET | Freq: Three times a day (TID) | ORAL | 0 refills | Status: DC | PRN

## 2017-09-28 NOTE — Progress Notes (Signed)
Office Visit Note   Patient: Isabella Larson           Date of Birth: 11/20/1980           MRN: [id number] Visit Date: 09/28/2017              Requested by: Johny Blamer, MD [phone] [address] [place], [place] [address] PCP: Johny Blamer, MD   Assessment & Plan: Visit Diagnoses:  1. Acute bilateral low back pain without sciatica   2. Trochanteric bursitis of both hips     Plan: We will send her to physical therapy for low back exercises, IT band stretching, hamstring stretching, modalities, and home exercise program.  She is given a prescription for Medrol Dosepak and Flexeril.  No NSAIDs while on the Medrol Dosepak.  We will see her back in a month check progress lack of  Follow-Up Instructions: Return in about 4 weeks (around 10/26/2017).   Orders:  Orders Placed This Encounter  Procedures  . XR Lumbar Spine 2-3 Views   Meds ordered this encounter  Medications  . cyclobenzaprine (FLEXERIL) 10 MG tablet    Sig: Take 1 tablet (10 mg total) by mouth 3 (three) times daily as needed for muscle spasms.    Dispense:  50 tablet    Refill:  0  . methylPREDNISolone (MEDROL) 4 MG tablet    Sig: Take as directed    Dispense:  21 tablet    Refill:  0      Procedures: No procedures performed   Clinical Data: No additional findings.   Subjective: Low back pain bilateral hip pain  HPI Isabella Larson is a 37 year old female comes in today with low back pain Ongoing for some time she states she has had adjustments of her back every 6 months for several years and regularly for the last 2-3 years.  However the adjustments are no longer helping with her low back pain.  She states her back pain is became progressively worse since October 2018.  She is having no real radicular symptoms down either leg.  She does have some pain lateral aspect of both hips.  She has had some occasions of awakening pain due to the back pain.  No bowel bladder dysfunction.  She does note an  increase in urinary frequency over the last year.  She is tried ibuprofen and Aleve with some relief.  No known injury to her lower back.  She has pain in both hips laterally when lying on them mostly in the right is greater than left. Review of Systems Please see HPI otherwise negative  Objective: Vital Signs: There were no vitals taken for this visit.  Physical Exam  Constitutional: She is oriented to person, place, and time. She appears well-developed and well-nourished. No distress.  Cardiovascular: Intact distal pulses.  Pulmonary/Chest: Effort normal.  Neurological: She is alert and oriented to person, place, and time.  Skin: She is not diaphoretic.  Psychiatric: She has a normal mood and affect.    Ortho Exam Lower extremity strength testing reveals 5 out of 5 strength throughout the lower extremities against resistance.  Negative straight leg raise bilaterally.  Tight hamstrings bilaterally.  She is able to touch her toes.  Has good extension of lumbar spine.  Minimal tenderness over the lower lumbar spinal column.  Tenderness over the trochanteric region bilateral hips good range of motion of both hips without significant pain.  Able to walk on her tiptoes and heels. Specialty Comments:  No specialty comments available.  Imaging: Xr Lumbar Spine 2-3 Views  Result Date: 09/28/2017 Lumbar spine 2 views: Slight scoliosis.  Disc spaces are well maintained except for L5-S1 which has significant disc space narrowing.  No spondylolisthesis.  No acute fractures.  No bony abnormalities.  On the AP view bilateral hips are pure well located and well preserved.    PMFS History: Patient Active Problem List   Diagnosis Date Noted  . Iron deficiency anemia, unspecified 09/13/2013  . B12 nutritional deficiency 09/13/2013  . H/O gastric bypass 09/13/2013   History reviewed. No pertinent past medical history.  History reviewed. No pertinent family history.  History reviewed. No pertinent  surgical history. Social History   Occupational History  . Not on file  Tobacco Use  . Smoking status: Never Smoker  Substance and Sexual Activity  . Alcohol use: Not on file  . Drug use: Not on file  . Sexual activity: Not on file

## 2017-10-28 ENCOUNTER — Ambulatory Visit (INDEPENDENT_AMBULATORY_CARE_PROVIDER_SITE_OTHER): Payer: BLUE CROSS/BLUE SHIELD | Admitting: Physician Assistant

## 2017-11-24 ENCOUNTER — Encounter (INDEPENDENT_AMBULATORY_CARE_PROVIDER_SITE_OTHER): Payer: Self-pay | Admitting: Orthopaedic Surgery

## 2017-11-24 ENCOUNTER — Ambulatory Visit (INDEPENDENT_AMBULATORY_CARE_PROVIDER_SITE_OTHER): Payer: BLUE CROSS/BLUE SHIELD | Admitting: Orthopaedic Surgery

## 2017-11-24 ENCOUNTER — Ambulatory Visit (INDEPENDENT_AMBULATORY_CARE_PROVIDER_SITE_OTHER): Payer: Self-pay

## 2017-11-24 DIAGNOSIS — M79672 Pain in left foot: Secondary | ICD-10-CM | POA: Diagnosis not present

## 2017-11-24 MED ORDER — DICLOFENAC SODIUM 75 MG PO TBEC: 75.0000 mg | DELAYED_RELEASE_TABLET | Freq: Two times a day (BID) | ORAL | 2 refills | Status: DC

## 2017-11-24 MED ORDER — DICLOFENAC SODIUM 1 % TD GEL: 2.0000 g | Freq: Four times a day (QID) | TRANSDERMAL | 5 refills | Status: DC

## 2017-11-24 NOTE — Progress Notes (Signed)
Office Visit Note   Patient: Isabella Larson           Date of Birth: November 02, 1980           MRN: [id number] Visit Date: 11/24/2017              Requested by: Johny BlamerHarris, William, MD [phone] [place], [place] [id number] PCP: Johny BlamerHarris, William, MD   Assessment & Plan: Visit Diagnoses:  1. Pain in left foot     Plan: Impression is lateral: Left foot pain.  X-rays obtained today are also negative for stress fracture.  She did have  x-rays initially 3 weeks ago which were also negative.  I think she is overloading her lateral column versus a stress reaction.  I recommend rest and immobilization with a Cam walker for 2-4 weeks as symptoms allow.  Prescription for oral and topical diclofenac.  Questions encouraged and answered.  Follow-up as needed.  Follow-Up Instructions: Return if symptoms worsen or fail to improve.   Orders:  Orders Placed This Encounter  Procedures  . XR Foot Complete Left   Meds ordered this encounter  Medications  . diclofenac (VOLTAREN) 75 MG EC tablet    Sig: Take 1 tablet (75 mg total) by mouth 2 (two) times daily.    Dispense:  30 tablet    Refill:  2  . diclofenac sodium (VOLTAREN) 1 % GEL    Sig: Apply 2 g topically 4 (four) times daily.    Dispense:  1 Tube    Refill:  5      Procedures: No procedures performed   Clinical Data: No additional findings.   Subjective: Chief Complaint  Patient presents with  . Left Foot - Pain     Patient is a healthy 37 year old female comes in with lateral foot pain for 3 weeks.  She denies any injuries.  [place] [employer] [place] the foot pain worse.  Denies any swelling or redness or constitutional symptoms.  Denies any numbness and tingling.    Review of Systems  Constitutional: Negative.   HENT: Negative.   Eyes: Negative.   Respiratory: Negative.   Cardiovascular: Negative.   Endocrine: Negative.   Musculoskeletal: Negative.   Neurological: Negative.     Hematological: Negative.   Psychiatric/Behavioral: Negative.   All other systems reviewed and are negative.    Objective: Vital Signs: There were no vitals taken for this visit.  Physical Exam  Constitutional: She is oriented to person, place, and time. She appears well-developed and well-nourished.  Pulmonary/Chest: Effort normal.  Neurological: She is alert and oriented to person, place, and time.  Skin: Skin is warm. Capillary refill takes less than 2 seconds.  Psychiatric: She has a normal mood and affect. Her behavior is normal. Judgment and thought content normal.  Nursing note and vitals reviewed.   Ortho Exam Left foot exam shows no significant swelling or skin changes.  She has tenderness along the fourth and fifth metatarsals. Specialty Comments:  No specialty comments available.  Imaging: Xr Foot Complete Left  Result Date: 11/24/2017 No acute or structural abnormalities    PMFS History: Patient Active Problem List   Diagnosis Date Noted  . Iron deficiency anemia, unspecified 09/13/2013  . B12 nutritional deficiency 09/13/2013  . H/O gastric bypass 09/13/2013   History reviewed. No pertinent past medical history.  History reviewed. No pertinent family history.  History reviewed. No pertinent surgical history. Social History   Occupational History  .  Not on file  Tobacco Use  . Smoking status: Never Smoker  . Smokeless tobacco: Never Used  Substance and Sexual Activity  . Alcohol use: Not on file  . Drug use: Not on file  . Sexual activity: Not on file

## 2017-12-03 ENCOUNTER — Telehealth (INDEPENDENT_AMBULATORY_CARE_PROVIDER_SITE_OTHER): Payer: Self-pay

## 2017-12-03 NOTE — Telephone Encounter (Signed)
General Authorization form [facility]- it was filled out and [facility]

## 2018-04-16 ENCOUNTER — Ambulatory Visit (INDEPENDENT_AMBULATORY_CARE_PROVIDER_SITE_OTHER): Payer: Self-pay

## 2018-04-16 ENCOUNTER — Encounter (INDEPENDENT_AMBULATORY_CARE_PROVIDER_SITE_OTHER): Payer: Self-pay | Admitting: Orthopaedic Surgery

## 2018-04-16 ENCOUNTER — Ambulatory Visit (INDEPENDENT_AMBULATORY_CARE_PROVIDER_SITE_OTHER): Payer: BLUE CROSS/BLUE SHIELD | Admitting: Orthopaedic Surgery

## 2018-04-16 VITALS — Ht 69.0 in

## 2018-04-16 DIAGNOSIS — M25561 Pain in right knee: Secondary | ICD-10-CM

## 2018-04-16 DIAGNOSIS — M1712 Unilateral primary osteoarthritis, left knee: Secondary | ICD-10-CM

## 2018-04-16 DIAGNOSIS — M25562 Pain in left knee: Secondary | ICD-10-CM

## 2018-04-16 MED ORDER — BUPIVACAINE HCL 0.5 % IJ SOLN
2.0000 mL | INTRAMUSCULAR | Status: AC | PRN
  Administered 2018-04-16: 2 mL via INTRA_ARTICULAR

## 2018-04-16 MED ORDER — LIDOCAINE HCL 1 % IJ SOLN
2.0000 mL | INTRAMUSCULAR | Status: AC | PRN
  Administered 2018-04-16: 2 mL

## 2018-04-16 MED ORDER — METHYLPREDNISOLONE ACETATE 40 MG/ML IJ SUSP
40.0000 mg | INTRAMUSCULAR | Status: AC | PRN
  Administered 2018-04-16: 40 mg via INTRA_ARTICULAR

## 2018-04-16 NOTE — Progress Notes (Signed)
Office Visit Note   Patient: Isabella Larson           Date of Birth: [birth year]           MRN: [id number] Visit Date: 04/16/2018              Requested by: Johny Blamer, MD [phone] [address] [place], [place] [address] PCP: Johny Blamer, MD   Assessment & Plan: Visit Diagnoses:  1. Unilateral primary osteoarthritis, left knee   2. Acute bilateral knee pain     Plan: Impression is 37 year old female with slightly age advanced arthritis and effusion of the left knee.  Aspiration injection performed today.  I counseled the patient on the importance of weight loss.  Questions encouraged and answered.  Follow-up as needed.  Follow-Up Instructions: Return if symptoms worsen or fail to improve.   Orders:  Orders Placed This Encounter  Procedures  . XR Knee Complete 4 Views Left  . XR Knee Complete 4 Views Right   No orders of the defined types were placed in this encounter.     Procedures: Large Joint Inj: L knee on 04/16/2018 11:05 AM Details: 22 G needle Medications: 2 mL bupivacaine 0.5 %; 2 mL lidocaine 1 %; 40 mg methylPREDNISolone acetate 40 MG/ML Aspirate: 20 mL clear Outcome: tolerated well, no immediate complications Patient was prepped and draped in the usual sterile fashion.       Clinical Data: No additional findings.   Subjective: Chief Complaint  Patient presents with  . Left Knee - Pain  . Right Knee - Pain    Patient is a female who I have previously seen for an unrelated issue presents with left greater than right knee pain.  She has had left knee pain since June.  She has been traveling a lot and doing a lot of walking.  She does endorse swelling and discomfort with flexion.  She endorses constant pain with locking symptoms.  The pain is worse with prolonged sitting and standing.  NSAIDs do ease the pain.   Review of Systems  Constitutional: Negative.   HENT: Negative.   Eyes: Negative.   Respiratory: Negative.     Cardiovascular: Negative.   Endocrine: Negative.   Musculoskeletal: Negative.   Neurological: Negative.   Hematological: Negative.   Psychiatric/Behavioral: Negative.   All other systems reviewed and are negative.    Objective: Vital Signs: Ht 5\' 9"  (1.753 m)   BMI 36.23 kg/m   Physical Exam  Constitutional: She is oriented to person, place, and time. She appears well-developed and well-nourished.  Pulmonary/Chest: Effort normal.  Neurological: She is alert and oriented to person, place, and time.  Skin: Skin is warm. Capillary refill takes less than 2 seconds.  Psychiatric: She has a normal mood and affect. Her behavior is normal. Judgment and thought content normal.  Nursing note and vitals reviewed.   Ortho Exam Left knee exam shows a moderate joint effusion.  Collaterals and cruciates are stable.  Normal range of motion.  Right knee exam is fairly benign. Specialty Comments:  No specialty comments available.  Imaging: Xr Knee Complete 4 Views Left  Result Date: 04/16/2018 Periarticular spurring.  No significant joint space narrowing.  Xr Knee Complete 4 Views Right  Result Date: 04/16/2018 Periarticular spurring.  No significant joint space narrowing.    PMFS History: Patient Active Problem List   Diagnosis Date Noted  . Iron deficiency anemia, unspecified 09/13/2013  . B12 nutritional deficiency 09/13/2013  . H/O  gastric bypass 09/13/2013   No past medical history on file.  No family history on file.  No past surgical history on file. Social History   Occupational History  . Not on file  Tobacco Use  . Smoking status: Never Smoker  . Smokeless tobacco: Never Used  Substance and Sexual Activity  . Alcohol use: Not on file  . Drug use: Not on file  . Sexual activity: Not on file

## 2019-04-08 ENCOUNTER — Other Ambulatory Visit: Payer: Self-pay

## 2019-04-08 ENCOUNTER — Ambulatory Visit (HOSPITAL_COMMUNITY)
Admission: RE | Admit: 2019-04-08 | Discharge: 2019-04-08 | Disposition: A | Discharge status: Home / Self Care | Payer: BC Managed Care – PPO | Source: Ambulatory Visit | Attending: Family Medicine | Admitting: Family Medicine

## 2019-04-08 DIAGNOSIS — D509 Iron deficiency anemia, unspecified: Secondary | ICD-10-CM | POA: Diagnosis present

## 2019-04-08 MED ORDER — SODIUM CHLORIDE 0.9 % IV SOLN
510.0000 mg | Freq: Once | INTRAVENOUS | Status: AC
  Administered 2019-04-08: 510 mg via INTRAVENOUS
  Filled 2019-04-08: qty 17

## 2019-04-08 MED ORDER — SODIUM CHLORIDE 0.9 % IV SOLN
INTRAVENOUS | Status: DC | PRN
Start: 2019-04-08 — End: 2019-04-09
  Administered 2019-04-08: 250 mL via INTRAVENOUS

## 2019-04-08 NOTE — Discharge Instructions (Signed)

## 2019-04-08 NOTE — Progress Notes (Signed)
Patient received Feraheme via PIV. Observed for at least 30 minutes post infusion.Tolerated well, vitals stable, discharge instructions given, verbalized understanding. Patient alert, oriented and ambulatory at the time of discharge.  

## 2019-04-13 ENCOUNTER — Telehealth: Payer: Self-pay | Admitting: Oncology

## 2019-04-13 NOTE — Telephone Encounter (Signed)
Confirmed with patient new patient visit with Dr. Alen Blew 8/11 @ 2 pm.

## 2019-04-15 ENCOUNTER — Other Ambulatory Visit: Payer: Self-pay

## 2019-04-15 ENCOUNTER — Ambulatory Visit (HOSPITAL_COMMUNITY)
Admission: RE | Admit: 2019-04-15 | Discharge: 2019-04-15 | Disposition: A | Discharge status: Home / Self Care | Payer: BC Managed Care – PPO | Source: Ambulatory Visit | Attending: Family Medicine | Admitting: Family Medicine

## 2019-04-15 DIAGNOSIS — D509 Iron deficiency anemia, unspecified: Secondary | ICD-10-CM | POA: Diagnosis not present

## 2019-04-15 MED ORDER — SODIUM CHLORIDE 0.9 % IV SOLN
510.0000 mg | Freq: Once | INTRAVENOUS | Status: AC
  Administered 2019-04-15: 510 mg via INTRAVENOUS
  Filled 2019-04-15: qty 17

## 2019-04-15 MED ORDER — SODIUM CHLORIDE 0.9 % IV SOLN
INTRAVENOUS | Status: DC | PRN
  Administered 2019-04-15: 250 mL via INTRAVENOUS

## 2019-04-15 NOTE — Progress Notes (Signed)
PATIENT CARE CENTER NOTE  Diagnosis: Iron Deficiency Anemia    Provider: Marilynne Drivers, PA   Procedure: IV Feraheme   Note: Patient received second Feraheme infusion via PIV. Tolerated well with no adverse reaction. Vital signs stable. Observed patient for 30 minutes post-infusion. Discharge instructions given. Patient alert, oriented and ambulatory at discharge.

## 2019-04-15 NOTE — Discharge Instructions (Signed)

## 2019-04-18 ENCOUNTER — Other Ambulatory Visit: Payer: Self-pay

## 2019-04-18 ENCOUNTER — Ambulatory Visit (INDEPENDENT_AMBULATORY_CARE_PROVIDER_SITE_OTHER): Payer: BC Managed Care – PPO | Admitting: Bariatrics

## 2019-04-18 ENCOUNTER — Encounter (INDEPENDENT_AMBULATORY_CARE_PROVIDER_SITE_OTHER): Payer: Self-pay | Admitting: Bariatrics

## 2019-04-18 VITALS — BP 117/78 | HR 71 | Temp 98.7°F | Ht 68.0 in | Wt 305.0 lb

## 2019-04-18 DIAGNOSIS — R5383 Other fatigue: Secondary | ICD-10-CM | POA: Diagnosis not present

## 2019-04-18 DIAGNOSIS — F3289 Other specified depressive episodes: Secondary | ICD-10-CM | POA: Diagnosis not present

## 2019-04-18 DIAGNOSIS — Z9189 Other specified personal risk factors, not elsewhere classified: Secondary | ICD-10-CM

## 2019-04-18 DIAGNOSIS — Z0289 Encounter for other administrative examinations: Secondary | ICD-10-CM

## 2019-04-18 DIAGNOSIS — E538 Deficiency of other specified B group vitamins: Secondary | ICD-10-CM

## 2019-04-18 DIAGNOSIS — E038 Other specified hypothyroidism: Secondary | ICD-10-CM

## 2019-04-18 DIAGNOSIS — Z862 Personal history of diseases of the blood and blood-forming organs and certain disorders involving the immune mechanism: Secondary | ICD-10-CM | POA: Diagnosis not present

## 2019-04-18 DIAGNOSIS — R0602 Shortness of breath: Secondary | ICD-10-CM

## 2019-04-18 DIAGNOSIS — Z6841 Body Mass Index (BMI) 40.0 and over, adult: Secondary | ICD-10-CM

## 2019-04-18 DIAGNOSIS — E559 Vitamin D deficiency, unspecified: Secondary | ICD-10-CM

## 2019-04-18 NOTE — Progress Notes (Signed)
.  Office: [phone]  /  Fax: [phone]   HPI:   Chief Complaint: OBESITY  Isabella SchneidersCourtney M Larson (MR# [id number]) is a 38 y.o. female who presents on 04/18/2019 for obesity evaluation and treatment. Current BMI is Body mass index is 46.38 kg/m.[name]. Isabella AmendCourtney has struggled with obesity for years and has been unsuccessful in either losing weight or maintaining long term weight loss. Isabella AmendCourtney attended our information session and states she is currently in the action stage of change and ready to dedicate time achieving and maintaining a healthier weight.   Isabella Larson craves salty/carb foods mainly in the afternoon. She does snack at night and states that her problem is snacking.  Isabella AmendCourtney states her family eats meals together at Wednesday family dinner and when eating out her desired weight loss is 115 lbs she has been heavy most of her life she started gaining weight in the 5th or 6th grade her heaviest weight ever was 320 lbs. she is a picky eater and doesn't like to eat healthier foods  she has significant food cravings issues  she snacks frequently in the evenings she skips meals frequently (breakfast) she frequently makes poor food choices she has problems with excessive hunger  she frequently eats larger portions than normal  she has binge eating behaviors she struggles with emotional eating    Fatigue Isabella AmendCourtney feels her energy is lower than it should be. This has worsened with weight gain and has worsened recently. Isabella AmendCourtney admits to daytime somnolence and denies waking up still tired. Patient is at risk for obstructive sleep apnea. Patient generally gets 6.5-7.5 hours of sleep per night, and states they generally have restful sleep. Snoring is not present. Apneic episodes are not present. Epworth Sleepiness Score is 7.  Dyspnea on exertion Isabella Larson notes increasing shortness of breath with certain activities and seems to be worsening over time with weight gain. She notes getting out of  breath sooner with activity than she used to. This has gotten worse recently. Isabella AmendCourtney denies orthopnea.  History of Gastric Bypass Isabella AmendCourtney states she had gastric bypass surgery in August 2005 by Dr. Adolphus Birchwoodasher. She reports her highest weight was 312 lbs and her lower weight was around 200 lbs. She states she still has restrictions.  Vitamin B12 Deficiency Isabella AmendCourtney has a diagnosis of B12 insufficiency and is taking OTC B12. She had recent labs which showed a level of 569. She has a history of weight loss surgery in August 2005.   History of Iron Deficiency Anemia Isabella AmendCourtney is status post R/T surgery and is currently taking iron. She has had iron infusions, last infusion was last week.  Hypothyroidism Isabella AmendCourtney has a diagnosis of hypothyroidism, which is controlled on medications.   Vitamin D deficiency Isabella AmendCourtney has a diagnosis of Vitamin D deficiency. Her last level was reported to be 18.62 on 03/24/2019. She is currently taking prescription Vit D and denies nausea, vomiting or muscle weakness.  At risk for osteopenia and osteoporosis Isabella AmendCourtney is at higher risk of osteopenia and osteoporosis due to Vitamin D deficiency.   Depression with emotional eating behaviors Isabella AmendCourtney is struggling with emotional eating and using food for comfort to the extent that it is negatively impacting her health. She often snacks when she is not hungry. Isabella AmendCourtney sometimes feels she is out of control and then feels guilty that she made poor food choices. She has been working on behavior modification techniques to help reduce her emotional eating and has been somewhat successful. Isabella Larson reports snacking at night and has  some binge eating behaviors. She shows no sign of suicidal or homicidal ideations.  Depression Screen Isabella Larson's Food and Mood (modified PHQ-9) score was 11. Depression screen PHQ 2/9 04/18/2019  Decreased Interest 1  Down, Depressed, Hopeless 2  PHQ - 2 Score 3  Altered sleeping 1  Tired,  decreased energy 3  Change in appetite 2  Feeling bad or failure about yourself  2  Trouble concentrating 0  Moving slowly or fidgety/restless 0  Suicidal thoughts 0  PHQ-9 Score 11  Difficult doing work/chores Not difficult at all   ASSESSMENT AND PLAN:  Other fatigue - Plan: EKG 12-Lead, Hemoglobin A1c, Insulin, random  B12 nutritional deficiency - Plan: Comprehensive metabolic panel  History of iron deficiency anemia  Other specified hypothyroidism  Vitamin D deficiency  Shortness of breath on exertion - Plan: Lipid Panel With LDL/HDL Ratio  Other depression  At risk for osteoporosis  Class 3 severe obesity with serious comorbidity and body mass index (BMI) 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.9 in adult, unspecified obesity type (HCC)  PLAN:  Fatigue Isabella AmendCourtney was informed that her fatigue may be related to obesity, depression or many other causes. Labs will be ordered, and in the meanwhile Isabella AmendCourtney has agreed to work on diet, exercise and weight loss to help with fatigue. Proper sleep hygiene was discussed including the need for 7-8 hours of quality sleep each night. A sleep study was not ordered based on symptoms and Epworth score.  Dyspnea on exertion Isabella Larson's shortness of breath appears to be obesity related and exercise induced. She has agreed to work on weight loss and gradually increase exercise to treat her exercise induced shortness of breath. If Isabella AmendCourtney follows our instructions and loses weight without improvement of her shortness of breath, we will plan to refer to pulmonology. We will monitor this condition regularly. Isabella AmendCourtney agrees to this plan.  History of Gastric Bypass Isabella AmendCourtney will increase protein and decrease healthy snacking.  Vitamin B12 Deficiency Isabella AmendCourtney will work on increasing B12 rich foods in her diet. She will continue taking B12 and will have labs done today.  Iron Deficiency Anemia Isabella AmendCourtney will continue taking iron and will follow-up with us as directed to  monitor her progress.  Hypothyroidism Isabella AmendCourtney was informed of the importance of good thyroid control to help with weight loss efforts. She was also informed that supertheraputic thyroid levels are dangerous and will not improve weight loss results. Isabella AmendCourtney will continue her medications and will have labs checked today.  Vitamin D Deficiency Isabella AmendCourtney was informed that low Vitamin D levels contributes to fatigue and are associated with obesity, breast, and colon cancer. She will have routine testing of Vitamin D today. She was informed of the risk of over-replacement of Vitamin D and agrees to not increase her dose unless she discusses this with us first. Isabella AmendCourtney agrees to follow-up with our clinic in 2 weeks.  At risk for osteopenia and osteoporosis Isabella AmendCourtney was given extended  (15 minutes) osteoporosis prevention counseling today. Isabella AmendCourtney is at risk for osteopenia and osteoporsis due to her Vitamin D deficiency. She was encouraged to take her Vitamin D and follow her higher calcium diet and increase strengthening exercise to help strengthen her bones and decrease her risk of osteopenia and osteoporosis.  Depression with Emotional Eating Behaviors We discussed behavior modification techniques today to help Isabella AmendCourtney deal with her emotional eating and depression. Isabella AmendCourtney will be referred to Dr. Dewaine CongerBarker, our bariatric psychologist.  Depression Screen Isabella AmendCourtney had a moderately positive depression screening. Depression is commonly  associated with obesity and often results in emotional eating behaviors. We will monitor this closely and work on CBT to help improve the non-hunger eating patterns.   Obesity Isabella Larson is currently in the action stage of change and her goal is to continue with weight loss efforts. She has agreed to follow the Category 4 plan. Isabella Larson will work on meal planning and intentional eating. We discussed her goals for breakfast, lunch, and dinner. Isabella Larson has been instructed  to work up to a goal of 150 minutes of combined cardio and strengthening exercise per week for weight loss and overall health benefits. We discussed the following Behavioral Modification Strategies today: increasing lean protein intake, decreasing simple carbohydrates, increasing vegetables, increase H20 intake, decrease eating out, no skipping meals, work on meal planning and easy cooking plans, and keeping healthy foods in the home.  Isabella Larson has agreed to follow-up with our clinic in 2 weeks. She was informed of the importance of frequent follow-up visits to maximize her success with intensive lifestyle modifications for her multiple health conditions. She was informed we would discuss her lab results at her next visit unless there is a critical issue that needs to be addressed sooner. Isabella Larson agreed to keep her next visit at the agreed upon time to discuss these results.  ALLERGIES: No Known Allergies  MEDICATIONS: Current Outpatient Medications on File Prior to Visit  Medication Sig Dispense Refill  . cyanocobalamin (,VITAMIN B-12,) 1000 MCG/ML injection Inject 1,000 mcg into the muscle every 30 (thirty) days.    . cyclobenzaprine (FLEXERIL) 10 MG tablet Take 1 tablet (10 mg total) by mouth 3 (three) times daily as needed for muscle spasms. 50 tablet 0  . ELDERBERRY PO Take by mouth.    . ferrous fumarate (HEMOCYTE - 106 MG FE) 325 (106 FE) MG TABS tablet Take 1 tablet (106 mg of iron total) by mouth daily. Take on an empty stomach with OJ 30 each 5  . levothyroxine (SYNTHROID, LEVOTHROID) 150 MCG tablet Take 300 mcg by mouth daily before breakfast.    . Vitamin D, Ergocalciferol, (DRISDOL) 1.25 MG (50000 UT) CAPS capsule Take 50,000 Units by mouth every 7 (seven) days.    . diclofenac (VOLTAREN) 75 MG EC tablet Take 1 tablet (75 mg total) by mouth 2 (two) times daily. (Patient not taking: Reported on 04/16/2018) 30 tablet 2  . diclofenac sodium (VOLTAREN) 1 % GEL Apply 2 g topically 4 (four)  times daily. (Patient not taking: Reported on 04/16/2018) 1 Tube 5  . methylPREDNISolone (MEDROL) 4 MG tablet Take as directed (Patient not taking: Reported on 04/16/2018) 21 tablet 0   No current facility-administered medications on file prior to visit.     PAST MEDICAL HISTORY: Past Medical History:  Diagnosis Date  . Anemia   . Back pain   . Edema of both feet   . Hypothyroidism   . Vitamin B 12 deficiency   . Vitamin D deficiency     PAST SURGICAL HISTORY: Past Surgical History:  Procedure Laterality Date  . mini gastric bypass      SOCIAL HISTORY: Social History   Tobacco Use  . Smoking status: Never Smoker  . Smokeless tobacco: Never Used  Substance Use Topics  . Alcohol use: Not on file  . Drug use: Not on file    FAMILY HISTORY: Family History  Problem Relation Age of Onset  . High Cholesterol Mother   . Cancer Mother   . Sleep apnea Mother   . Obesity Mother   .  Obesity Father   . Sleep apnea Father   . High Cholesterol Father    ROS: Review of Systems  Constitutional: Positive for malaise/fatigue.  HENT:       Positive for dry mouth.  Eyes:       Positive for wearing glasses or contacts.  Respiratory: Positive for shortness of breath (with activity).   Gastrointestinal: Positive for heartburn. Negative for nausea and vomiting.  Musculoskeletal: Positive for back pain.       Positive for muscle stiffness. Negative for muscle weakness.  Skin:       Positive for skin dryness.  Psychiatric/Behavioral: Positive for depression (emotional eating). Negative for suicidal ideas.       Negative for homicidal ideas.   PHYSICAL EXAM: Blood pressure 117/78, pulse 71, temperature 98.7 F (37.1 C), temperature source Oral, height 5\' 8"  (1.727 m), weight (!) 305 lb (138.3 kg), last menstrual period 04/02/2019, SpO2 98 %. Body mass index is 46.38 kg/m. Physical Exam Vitals signs reviewed.  Constitutional:      Appearance: Normal appearance. She is  well-developed. She is obese.  HENT:     Head: Normocephalic and atraumatic.     Nose: Nose normal.  Eyes:     General: No scleral icterus. Neck:     Musculoskeletal: Normal range of motion.  Cardiovascular:     Rate and Rhythm: Normal rate and regular rhythm.  Pulmonary:     Effort: Pulmonary effort is normal. No respiratory distress.  Abdominal:     Palpations: Abdomen is soft.     Tenderness: There is no abdominal tenderness.  Musculoskeletal: Normal range of motion.     Comments: Range of motion normal in all four extremities.  Skin:    General: Skin is warm and dry.  Neurological:     Mental Status: She is alert and oriented to person, place, and time.     Coordination: Coordination normal.  Psychiatric:        Mood and Affect: Mood and affect normal.        Behavior: Behavior normal.   RECENT LABS AND TESTS: BMET    Component Value Date/Time   NA 141 09/23/2013 0932   K 3.7 09/23/2013 0932   CO2 23 09/23/2013 0932   GLUCOSE 97 09/23/2013 0932   BUN 11.6 09/23/2013 0932   CREATININE 0.7 09/23/2013 0932   CALCIUM 8.8 09/23/2013 0932   No results found for: HGBA1C No results found for: INSULIN CBC    Component Value Date/Time   WBC 5.3 09/23/2013 0932   RBC 4.42 09/23/2013 0932   HGB 11.5 (L) 09/23/2013 0932   HCT 36.3 09/23/2013 0932   PLT 214 09/23/2013 0932   MCV 82.1 09/23/2013 0932   MCH 26.0 09/23/2013 0932   MCHC 31.7 09/23/2013 0932   RDW 19.0 (H) 09/23/2013 0932   LYMPHSABS 2.1 09/23/2013 0932   MONOABS 0.4 09/23/2013 0932   EOSABS 0.1 09/23/2013 0932   BASOSABS 0.0 09/23/2013 0932   Iron/TIBC/Ferritin/ %Sat No results found for: IRON, TIBC, FERRITIN, IRONPCTSAT Lipid Panel  No results found for: CHOL, TRIG, HDL, CHOLHDL, VLDL, LDLCALC, LDLDIRECT Hepatic Function Panel     Component Value Date/Time   PROT 6.8 09/23/2013 0932   ALBUMIN 3.6 09/23/2013 0932   AST 18 09/23/2013 0932   ALT 20 09/23/2013 0932   ALKPHOS 87 09/23/2013 0932    BILITOT 0.38 09/23/2013 0932   No results found for: TSH   No results found for: Vitamin D  ECG shows sinus  rhythm with a rate of 68 BPM, within normal limits.  INDIRECT CALORIMETER done today shows a VO2 of 376 and a REE of 2621.  Her calculated basal metabolic rate is 5409 thus her basal metabolic rate is better than expected.  OBESITY BEHAVIORAL INTERVENTION VISIT  Today's visit was #1   Starting weight: 305 lbs Starting date: 04/18/2019 Today's weight: 305 lbs  Today's date: 04/18/2019 Total lbs lost to date: 0    04/18/2019  Height  (1.727 m)  Weight 305 lb (138.3 kg) (A)  BMI (Calculated) 46.39  BLOOD PRESSURE - SYSTOLIC 117  BLOOD PRESSURE - DIASTOLIC 78  Waist Measurement  53 inches   Body Fat % 53.7 %  Total Body Water (lbs) 109.2 lbs  RMR 2621   ASK: We discussed the diagnosis of obesity with Isabella Larson today and Isabella Larson agreed to give [place] permission to discuss obesity behavioral modification therapy today.  ASSESS: Lenny has the diagnosis of obesity and her BMI today is 46.4. Naiah is in the action stage of change.   ADVISE: Amani was educated on the multiple health risks of obesity as well as the benefit of weight loss to improve her health. She was advised of the need for long term treatment and the importance of lifestyle modifications to improve her current health and to decrease her risk of future health problems.  AGREE: Multiple dietary modification options and treatment options were discussed and  Ashani agreed to follow the recommendations documented in the above note.  ARRANGE: Latisa was educated on the importance of frequent visits to treat obesity as outlined per CMS and USPSTF guidelines and agreed to schedule her next follow up appointment today.  Fernanda Drum, am acting as Energy manager for [employer], DO  I have reviewed the above documentation for accuracy and completeness, and I agree with the above. -Corinna Capra, DO

## 2019-04-19 ENCOUNTER — Encounter (INDEPENDENT_AMBULATORY_CARE_PROVIDER_SITE_OTHER): Payer: Self-pay | Admitting: Bariatrics

## 2019-04-19 DIAGNOSIS — R7303 Prediabetes: Secondary | ICD-10-CM | POA: Insufficient documentation

## 2019-04-19 LAB — LIPID PANEL WITH LDL/HDL RATIO
Cholesterol, Total: 183 mg/dL (ref 100–199)
HDL: 60 mg/dL (ref >39)
LDL Calculated: 102 mg/dL — ABNORMAL HIGH (ref 0–99)
LDl/HDL Ratio: 1.7 ratio (ref 0.0–3.2)
Triglycerides: 103 mg/dL (ref 0–149)
VLDL Cholesterol Cal: 21 mg/dL (ref 5–40)

## 2019-04-19 LAB — COMPREHENSIVE METABOLIC PANEL
ALT: 20 IU/L (ref 0–32)
AST: 20 IU/L (ref 0–40)
Albumin/Globulin Ratio: 1.5 (ref 1.2–2.2)
Albumin: 4 g/dL (ref 3.8–4.8)
Alkaline Phosphatase: 107 IU/L (ref 39–117)
BUN/Creatinine Ratio: 16 (ref 9–23)
BUN: 10 mg/dL (ref 6–20)
Bilirubin Total: 0.2 mg/dL (ref 0.0–1.2)
CO2: 23 mmol/L (ref 20–29)
Calcium: 9 mg/dL (ref 8.7–10.2)
Chloride: 99 mmol/L (ref 96–106)
Creatinine, Ser: 0.63 mg/dL (ref 0.57–1.00)
GFR calc Af Amer: 133 mL/min/{1.73_m2} (ref >59)
GFR calc non Af Amer: 116 mL/min/{1.73_m2} (ref >59)
Globulin, Total: 2.6 g/dL (ref 1.5–4.5)
Glucose: 100 mg/dL — ABNORMAL HIGH (ref 65–99)
Potassium: 4.7 mmol/L (ref 3.5–5.2)
Sodium: 140 mmol/L (ref 134–144)
Total Protein: 6.6 g/dL (ref 6.0–8.5)

## 2019-04-19 LAB — HEMOGLOBIN A1C
Est. average glucose Bld gHb Est-mCnc: 120 mg/dL
Hgb A1c MFr Bld: 5.8 % — ABNORMAL HIGH (ref 4.8–5.6)

## 2019-04-19 LAB — INSULIN, RANDOM: INSULIN: 15.7 u[IU]/mL (ref 2.6–24.9)

## 2019-04-28 NOTE — Progress Notes (Signed)
Office: [phone]  /  Fax: [phone]    Date: May 02, 2019   Appointment Start Time: 11:02am Duration: 32 minutes Provider: Glennie Isle, Psy.D. Type of Session: Intake for Individual Therapy  Location of Patient: Work Location of Provider: [facility] Type of Contact: Telepsychological Visit via [employer]  Informed Consent: Prior to proceeding with today's appointment, two pieces of identifying information were obtained from [place] to verify identity. In addition, Angala's physical location at the time of this appointment was obtained. Kaiyla reported she was at work and provided the address. In the event of technical difficulties, Cornella shared a phone number she could be reached at. Rhiann and this provider participated in today's telepsychological service. Also, Candus denied anyone else being present in the room or on the WebEx appointment.   The provider's role was explained to Gillie Manners. The provider reviewed and discussed issues of confidentiality, privacy, and limits therein (e.g., reporting obligations). In addition to verbal informed consent, written informed consent for psychological services was obtained from Bridger prior to the initial intake interview. Written consent included information concerning the practice, financial arrangements, and confidentiality and patients' rights. Since the clinic is not a 24/7 crisis center, mental health emergency resources were shared, and the provider explained MyChart, e-mail, voicemail, and/or other messaging systems should be utilized only for non-emergency reasons. This provider also explained that information obtained during appointments will be placed in Nessa's medical record in a confidential manner and relevant information will be shared with other providers at [facility] that she meets with for coordination of care. Hydia verbally acknowledged understanding of the  aforementioned, and agreed to use mental health emergency resources discussed if needed. Moreover, Ketzia agreed information may be shared with other [facility] providers as needed for coordination of care. By signing the service agreement document, Syesha provided written consent for coordination of care.   Prior to initiating telepsychological services, Briah was provided with an informed consent document, which included the development of a safety plan (i.e., an emergency contact and emergency resources) in the event of an emergency/crisis. Cinderella expressed understanding of the rationale of the safety plan and provided consent for this provider to reach out to her emergency contact in the event of an emergency/crisis. Emerlyn returned the completed consent form prior to today's appointment. This provider verbally reviewed the consent form during today's appointment prior to proceeding with the appointment. Danniell verbally acknowledged understanding that she is ultimately responsible for understanding her insurance benefits as it relates to reimbursement of telepsychological and in-person services. This provider also reviewed confidentiality, as it relates to telepsychological services, as well as the rationale for telepsychological services. More specifically, this provider's clinic is limiting in-person visits due to COVID-19. Therapeutic services will resume to in-person appointments once deemed appropriate. Avalynne expressed understanding regarding the rationale for telepsychological services. In addition, this provider explained the telepsychological services informed consent document would be considered an addendum to the initial consent document/service agreement. Beata verbally consented to proceed.   Chief Complaint/HPI: Brietta was referred by Dr. Jearld Lesch due to depression with emotional eating behaviors. Per the note for the initial visit with Dr. Jearld Lesch on  April 18, 2019, "Serrena is struggling with emotional eating and using food for comfort to the extent that it is negatively impacting her health. She often snacks when she is not hungry. Johnnetta sometimes feels she is out of control and then feels guilty that she made poor  food choices. She has been working on behavior modification techniques to help reduce her emotional eating and has been somewhat successful. Cornie reports snacking at night and has some binge eating behaviors. She shows no sign of suicidal or homicidal ideations." [place] also reported experiencing the following: significant food cravings issues , snacking frequently in the evenings, frequently making poor food choices, frequently eating larger portions than normal , binge eating behaviors, struggling with emotional eating, having problems with excessive hunger and skipping breakfast frequently . 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 Evorn Gong. She reports her highest weight was 312 lbs and her lower weight was around 200 lbs. She states she still has restrictions."  During today's appointment, Logann was verbally administered a questionnaire assessing various behaviors related to emotional eating. Vasilisa endorsed the following: overeat when you are celebrating, experience food cravings on a regular basis, eat certain foods when you are anxious, stressed, depressed, or your feelings are hurt, use food to help you cope with emotional situations, find food is comforting to you, overeat when you are angry or upset, overeat when you are worried about something, overeat frequently when you are bored or lonely, overeat when you are angry at someone just to show them they cannot control you, overeat when you are alone, but eat much less when you are with other people and eat as a reward. She shared she craves "usually salty things," such as popcorn and Cheez Its.  Migdalia believes the onset of emotional eating was likely in college. Prior to starting with the clinic, she described the frequency of emotional eating as "three or five times a week." In addition, Daniel believes she has engaged in binge eating. This was explored and she shared an example where she sat down with a bag of White Cheddar popcorn and consumed the bag mindlessly. She described the aforementioned typically occurs "2 or 3 times a week." She denied feeling out of control while eating. Mry believes the onset of binge eating was likely in college as she was not surrounded by others while in her dorm. Virgil denied a history of restricting food intake, purging and engagement in other compensatory strategies, and has never been diagnosed with an eating disorder. She also denied a history of treatment for emotional eating. Moreover, Almina indicated boredom and loneliness triggers emotional eating, whereas staying busy and having something else on her mind to do makes emotional eating better. Furthermore, Samyra denied other problems of concern.    Mental Status Examination:  Appearance: neat Behavior: cooperative Mood: euthymic Affect: mood congruent Speech: normal in rate, volume, and tone Eye Contact: appropriate Psychomotor Activity: appropriate Thought Process: linear, logical, and goal directed  Content/Perceptual Disturbances: denies suicidal and homicidal ideation, plan, and intent and no hallucinations, delusions, bizarre thinking or behavior reported or observed Orientation: time, person, place and purpose of appointment Cognition/Sensorium: memory, attention, language, and fund of knowledge intact  Insight: good Judgment: good  Family & Psychosocial History: Safaa reported she is single and does not have any children. 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 Additionally, Brigida shared her highest level of education  obtained is a master's degree. Currently, Yaneli's social support system consists of her parents, siblings, and friend. Moreover, Wynnie stated she resides alone.   Medical History:  Past Medical History:  Diagnosis Date   Anemia    Back pain    Edema of  both feet    Hypothyroidism    Vitamin B 12 deficiency    Vitamin D deficiency    Past Surgical History:  Procedure Laterality Date   mini gastric bypass     Current Outpatient Medications on File Prior to Visit  Medication Sig Dispense Refill   cyanocobalamin (,VITAMIN B-12,) 1000 MCG/ML injection Inject 1,000 mcg into the muscle every 30 (thirty) days.     cyclobenzaprine (FLEXERIL) 10 MG tablet Take 1 tablet (10 mg total) by mouth 3 (three) times daily as needed for muscle spasms. 50 tablet 0   diclofenac (VOLTAREN) 75 MG EC tablet Take 1 tablet (75 mg total) by mouth 2 (two) times daily. (Patient not taking: Reported on 04/16/2018) 30 tablet 2   diclofenac sodium (VOLTAREN) 1 % GEL Apply 2 g topically 4 (four) times daily. (Patient not taking: Reported on 04/16/2018) 1 Tube 5   ELDERBERRY PO Take by mouth.     ferrous fumarate (HEMOCYTE - 106 MG FE) 325 (106 FE) MG TABS tablet Take 1 tablet (106 mg of iron total) by mouth daily. Take on an empty stomach with OJ 30 each 5   levothyroxine (SYNTHROID, LEVOTHROID) 150 MCG tablet Take 300 mcg by mouth daily before breakfast.     methylPREDNISolone (MEDROL) 4 MG tablet Take as directed (Patient not taking: Reported on 04/16/2018) 21 tablet 0   Vitamin D, Ergocalciferol, (DRISDOL) 1.25 MG (50000 UT) CAPS capsule Take 50,000 Units by mouth every 7 (seven) days.     No current facility-administered medications on file prior to visit.   Cariah denied a history of head injuries and loss of consciousness.    Mental Health History: Gerldine denied a history of therapeutic services. Caroleena denied a history of hospitalizations for psychiatric concerns, and has never met with  a psychiatrist. Lorrena denied ever being prescribed psychotropic medications. Naw denied a family history of mental health related concerns. Elsa denied a trauma history, including psychological, physical  and sexual abuse, as well as neglect.   Delesia described her typical mood as "pretty easy going." Aside from concerns noted above and endorsed on the PHQ-9, Lynden reported experiencing decreased self-esteem due to weight. She indicated her weight impacts her mood (e.g., decreased mood) at times. Tara denied current alcohol use. She denied tobacco use. She denied illicit/recreational substance use. Regarding caffeine intake, Kehaulani reported she consumes a [employer] soda daily. Furthermore, Danahi denied experiencing the following: hopelessness, hallucinations and delusions, paranoia, mania and decreased motivation. She also denied history of and current suicidal ideation, plan, and intent; history of and current homicidal ideation, plan, and intent; and history of and current engagement in self-harm.  The following strengths were reported by [facility]: helpful, useful to others, reader, compassionate, and smart. The following strengths were observed by this provider: ability to express thoughts and feelings during the therapeutic session, ability to establish and benefit from a therapeutic relationship, ability to learn and practice coping skills, willingness to work toward established goal(s) with the clinic and ability to engage in reciprocal conversation.  Legal History: Kyriaki denied a history of legal involvement.   Structured Assessment Results: The Patient Health Questionnaire-9 (PHQ-9) is a self-report measure that assesses symptoms and severity of depression over the course of the last two weeks. Raschelle obtained a score of 2 suggesting minimal depression. Monaca finds the endorsed symptoms to be not difficult at all. Little interest or pleasure in doing things 0    Feeling down, depressed, or hopeless 0  Trouble falling  or staying asleep, or sleeping too much 0  Feeling tired or having little energy 0  Poor appetite or overeating 1  Feeling bad about yourself --- or that you are a failure or have let yourself or your family down 1  Trouble concentrating on things, such as reading the newspaper or watching television 0  Moving or speaking so slowly that other people could have noticed? Or the opposite --- being so fidgety or restless that you have been moving around a lot more than usual 0  Thoughts that you would be better off dead or hurting yourself in some way 0  PHQ-9 Score 2    The Generalized Anxiety Disorder-7 (GAD-7) is a brief self-report measure that assesses symptoms of anxiety over the course of the last two weeks. Pernell obtained a score of 0. Feeling nervous, anxious, on edge 0  Not being able to stop or control worrying 0  Worrying too much about different things 0  Trouble relaxing 0  Being so restless that it's hard to sit still 0  Becoming easily annoyed or irritable 0  Feeling afraid as if something awful might happen 0  GAD-7 Score 0   Interventions: A chart review was conducted prior to the clinical intake interview. The PHQ-9, and GAD-7 were verbally administered as well as a Mood and Food questionnaire to assess various behaviors related to emotional eating. Throughout session, empathic reflections and validation was provided. Continuing treatment with this provider was discussed and a treatment goal was established. Psychoeducation regarding emotional versus physical hunger was provided. Kemoni was sent a handout via e-mail to utilize between now and the next appointment to increase awareness of hunger patterns and subsequent eating. Arcola provided verbal consent during today's appointment for this provider to send the handout via e-mail.   Provisional DSM-5 Diagnosis: 311 (F32.8) Other Specified Depressive Disorder,  Emotional Eating Behaviors  Plan: Jacquel appears able and willing to participate as evidenced by collaboration on a treatment goal, engagement in reciprocal conversation, and asking questions as needed for clarification. The next appointment will be scheduled in two weeks, which will be via [employer]. The following treatment goal was established: decrease emotional eating. Once this provider's office resumes in-person appointments and it is deemed appropriate, Shaleigh will be notified. For the aforementioned goal, Nabeeha can benefit from biweekly individual therapy sessions that are brief in duration for approximately four to six sessions. The treatment modality will be individual therapeutic services, including an eclectic therapeutic approach utilizing techniques from Cognitive Behavioral Therapy, Patient Centered Therapy, Dialectical Behavior Therapy, Acceptance and Commitment Therapy, Interpersonal Therapy, and Cognitive Restructuring. Therapeutic approach will include various interventions as appropriate, such as validation, support, mindfulness, thought defusion, reframing, psychoeducation, values assessment, and role playing. This provider will regularly review the treatment plan and medical chart to keep informed of status changes. Kaziah expressed understanding and agreement with the initial treatment plan of care.

## 2019-05-02 ENCOUNTER — Ambulatory Visit (INDEPENDENT_AMBULATORY_CARE_PROVIDER_SITE_OTHER): Payer: BC Managed Care – PPO | Admitting: Psychology

## 2019-05-02 ENCOUNTER — Encounter (INDEPENDENT_AMBULATORY_CARE_PROVIDER_SITE_OTHER): Payer: Self-pay | Admitting: Bariatrics

## 2019-05-02 ENCOUNTER — Ambulatory Visit (INDEPENDENT_AMBULATORY_CARE_PROVIDER_SITE_OTHER): Payer: BC Managed Care – PPO | Admitting: Bariatrics

## 2019-05-02 ENCOUNTER — Other Ambulatory Visit: Payer: Self-pay

## 2019-05-02 VITALS — BP 111/73 | HR 65 | Temp 98.5°F | Ht 68.0 in | Wt 299.0 lb

## 2019-05-02 DIAGNOSIS — R7303 Prediabetes: Secondary | ICD-10-CM | POA: Diagnosis not present

## 2019-05-02 DIAGNOSIS — F3289 Other specified depressive episodes: Secondary | ICD-10-CM

## 2019-05-02 DIAGNOSIS — Z9884 Bariatric surgery status: Secondary | ICD-10-CM

## 2019-05-02 DIAGNOSIS — E559 Vitamin D deficiency, unspecified: Secondary | ICD-10-CM | POA: Diagnosis not present

## 2019-05-02 DIAGNOSIS — Z6841 Body Mass Index (BMI) 40.0 and over, adult: Secondary | ICD-10-CM

## 2019-05-02 DIAGNOSIS — E661 Drug-induced obesity: Secondary | ICD-10-CM

## 2019-05-03 ENCOUNTER — Inpatient Hospital Stay: Payer: BC Managed Care – PPO | Attending: Oncology | Admitting: Oncology

## 2019-05-03 ENCOUNTER — Other Ambulatory Visit: Payer: Self-pay

## 2019-05-03 VITALS — BP 132/70 | HR 75 | Temp 98.2°F | Resp 17 | Ht 68.0 in | Wt 302.4 lb

## 2019-05-03 DIAGNOSIS — D508 Other iron deficiency anemias: Secondary | ICD-10-CM

## 2019-05-03 DIAGNOSIS — Z9884 Bariatric surgery status: Secondary | ICD-10-CM

## 2019-05-03 NOTE — Progress Notes (Signed)
Office: [phone]  /  Fax: [phone]   HPI:   Chief Complaint: OBESITY Isabella Larson is here to discuss her progress with her obesity treatment plan. She is on the Category 4 (modified) plan and is following her eating plan approximately 100% of the time. She states she is exercising 0 minutes 0 times per week. Isabella Larson is down 6 lbs from her last visit and is doing well. She liked that she did not feel hungry. She reports struggling for vegetables and struggles with snacking somewhat. Her weight is 299 lb (135.6 kg) today and has had a weight loss of 6 pounds over a period of 2 weeks since her last visit. She has lost 6 lbs since starting treatment with us.  Vitamin D deficiency Isabella Larson has a diagnosis of Vitamin D deficiency. Her last Vitamin D level was reported to be 18.62. She is currently taking high dose Vit D and denies nausea, vomiting or muscle weakness.  Pre-Diabetes (New) Isabella Larson has a diagnosis of prediabetes based on her elevated Hgb A1c and was informed this puts her at greater risk of developing diabetes. Last A1c was 5.8 on 04/18/2019 and insulin was 15.7. She is not taking metformin currently and continues to work on diet and exercise to decrease risk of diabetes. She reports appetite is normal and she has increased energy.  History of Gastric Bypass Isabella Larson reports no abdominal pain or complications.  Depression with emotional eating behaviors Isabella Larson is struggling with emotional eating and using food for comfort to the extent that it is negatively impacting her health. She often snacks when she is not hungry. Isabella Larson sometimes feels she is out of control and then feels guilty that she made poor food choices. She has been working on behavior modification techniques to help reduce her emotional eating and has been somewhat successful. Isabella Larson reports "head hunger." She shows no sign of suicidal or homicidal ideations.  Depression screen PHQ 2/9 04/18/2019  Decreased  Interest 1  Down, Depressed, Hopeless 2  PHQ - 2 Score 3  Altered sleeping 1  Tired, decreased energy 3  Change in appetite 2  Feeling bad or failure about yourself  2  Trouble concentrating 0  Moving slowly or fidgety/restless 0  Suicidal thoughts 0  PHQ-9 Score 11  Difficult doing work/chores Not difficult at all   ASSESSMENT AND PLAN:  Vitamin D deficiency  Prediabetes  Other depression - with emotional eating  History of gastric bypass  Class 3 severe obesity with serious comorbidity and body mass index (BMI) 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.9 in adult, unspecified obesity type (HCC)  PLAN:  Vitamin D Deficiency Isabella Larson was informed that low Vitamin D levels contributes to fatigue and are associated with obesity, breast, and colon cancer. She agrees to continue to take prescription Vit D @ 50,000 IU every week and will follow-up for routine testing of Vitamin D, at least 2-3 times per year. She was informed of the risk of over-replacement of Vitamin D and agrees to not increase her dose unless she discusses this with us first. Isabella Larson agrees to follow-up with our clinic in 2 weeks.  Pre-Diabetes (New) Isabella Larson will continue to work on weight loss, exercise, and decreasing simple carbohydrates in her diet to help decrease the risk of diabetes. We dicussed metformin including benefits and risks. She was informed that eating too many simple carbohydrates or too many calories at one sitting increases the likelihood of GI side effects. Isabella Larson was instructed to decrease carbohydrates and increase protein. She will  follow-up with us as directed to monitor her progress.  History of Gastric Bypass Isabella Larson was instructed to increase her protein intake and to eat smaller, frequent meals.  Depression with Emotional Eating Behaviors We discussed behavior modification techniques today to help Isabella Larson deal with her emotional eating and depression. Isabella Larson is working with Dr. Dewaine CongerBarker. We discussed  medications for stress/emotional eating and she is not interested at this time.  I spent > than 50% of the 15 minute visit on counseling as documented in the note.  Obesity Isabella Larson is currently in the action stage of change. As such, her goal is to continue with weight loss efforts. She has agreed to follow the Category 4 plan. Isabella Larson is saving her snack calories and fruit for the evening. Handout given on Store Bought Seasonings and Homemade Seasonings. Isabella Larson has been instructed to work up to a goal of 150 minutes of combined cardio and strengthening exercise per week for weight loss and overall health benefits. We discussed the following Behavioral Modification Strategies today: increasing lean protein intake, decreasing simple carbohydrates, increasing vegetables, increase H20 intake, decrease eating out, no skipping meals, work on meal planning and easy cooking plans, and keeping healthy foods in the home.  Isabella Larson has agreed to follow-up with our clinic in 2 weeks. She was informed of the importance of frequent follow-up visits to maximize her success with intensive lifestyle modifications for her multiple health conditions.  ALLERGIES: No Known Allergies  MEDICATIONS: Current Outpatient Medications on File Prior to Visit  Medication Sig Dispense Refill  . cyanocobalamin (,VITAMIN B-12,) 1000 MCG/ML injection Inject 1,000 mcg into the muscle every 30 (thirty) days.    . cyclobenzaprine (FLEXERIL) 10 MG tablet Take 1 tablet (10 mg total) by mouth 3 (three) times daily as needed for muscle spasms. 50 tablet 0  . diclofenac (VOLTAREN) 75 MG EC tablet Take 1 tablet (75 mg total) by mouth 2 (two) times daily. 30 tablet 2  . diclofenac sodium (VOLTAREN) 1 % GEL Apply 2 g topically 4 (four) times daily. 1 Tube 5  . ELDERBERRY PO Take by mouth.    . ferrous fumarate (HEMOCYTE - 106 MG FE) 325 (106 FE) MG TABS tablet Take 1 tablet (106 mg of iron total) by mouth daily. Take on an empty  stomach with OJ 30 each 5  . levothyroxine (SYNTHROID, LEVOTHROID) 150 MCG tablet Take 300 mcg by mouth daily before breakfast.    . methylPREDNISolone (MEDROL) 4 MG tablet Take as directed 21 tablet 0  . Vitamin D, Ergocalciferol, (DRISDOL) 1.25 MG (50000 UT) CAPS capsule Take 50,000 Units by mouth every 7 (seven) days.     No current facility-administered medications on file prior to visit.     PAST MEDICAL HISTORY: Past Medical History:  Diagnosis Date  . Anemia   . Back pain   . Edema of both feet   . Hypothyroidism   . Vitamin B 12 deficiency   . Vitamin D deficiency     PAST SURGICAL HISTORY: Past Surgical History:  Procedure Laterality Date  . mini gastric bypass      SOCIAL HISTORY: Social History   Tobacco Use  . Smoking status: Never Smoker  . Smokeless tobacco: Never Used  Substance Use Topics  . Alcohol use: Not on file  . Drug use: Not on file    FAMILY HISTORY: Family History  Problem Relation Age of Onset  . High Cholesterol Mother   . Cancer Mother   . Sleep apnea Mother   .  Obesity Mother   . Obesity Father   . Sleep apnea Father   . High Cholesterol Father    ROS: Review of Systems  Gastrointestinal: Negative for nausea and vomiting.  Musculoskeletal:       Negative for muscle weakness.  Psychiatric/Behavioral: Positive for depression (emotional eating). Negative for suicidal ideas.       Negative for homicidal ideas.   PHYSICAL EXAM: Blood pressure 111/73, pulse 65, temperature 98.5 F (36.9 C), temperature source Oral, height 5\' 8"  (1.727 m), weight 299 lb (135.6 kg), last menstrual period 04/02/2019, SpO2 98 %. Body mass index is 45.46 kg/m. Physical Exam Vitals signs reviewed.  Constitutional:      Appearance: Normal appearance. She is obese.  Cardiovascular:     Rate and Rhythm: Normal rate.     Pulses: Normal pulses.  Pulmonary:     Effort: Pulmonary effort is normal.     Breath sounds: Normal breath sounds.   Musculoskeletal: Normal range of motion.  Skin:    General: Skin is warm and dry.  Neurological:     Mental Status: She is alert and oriented to person, place, and time.  Psychiatric:        Behavior: Behavior normal.   RECENT LABS AND TESTS: BMET    Component Value Date/Time   NA 140 04/18/2019 1220   NA 141 09/23/2013 0932   K 4.7 04/18/2019 1220   K 3.7 09/23/2013 0932   CL 99 04/18/2019 1220   CO2 23 04/18/2019 1220   CO2 23 09/23/2013 0932   GLUCOSE 100 (H) 04/18/2019 1220   GLUCOSE 97 09/23/2013 0932   BUN 10 04/18/2019 1220   BUN 11.6 09/23/2013 0932   CREATININE 0.63 04/18/2019 1220   CREATININE 0.7 09/23/2013 0932   CALCIUM 9.0 04/18/2019 1220   CALCIUM 8.8 09/23/2013 0932   GFRNONAA 116 04/18/2019 1220   GFRAA 133 04/18/2019 1220   Lab Results  Component Value Date   HGBA1C 5.8 (H) 04/18/2019   Lab Results  Component Value Date   INSULIN 15.7 04/18/2019   CBC    Component Value Date/Time   WBC 5.3 09/23/2013 0932   RBC 4.42 09/23/2013 0932   HGB 11.5 (L) 09/23/2013 0932   HCT 36.3 09/23/2013 0932   PLT 214 09/23/2013 0932   MCV 82.1 09/23/2013 0932   MCH 26.0 09/23/2013 0932   MCHC 31.7 09/23/2013 0932   RDW 19.0 (H) 09/23/2013 0932   LYMPHSABS 2.1 09/23/2013 0932   MONOABS 0.4 09/23/2013 0932   EOSABS 0.1 09/23/2013 0932   BASOSABS 0.0 09/23/2013 0932   Iron/TIBC/Ferritin/ %Sat No results found for: IRON, TIBC, FERRITIN, IRONPCTSAT Lipid Panel     Component Value Date/Time   CHOL 183 04/18/2019 1220   TRIG 103 04/18/2019 1220   HDL 60 04/18/2019 1220   LDLCALC 102 (H) 04/18/2019 1220   Hepatic Function Panel     Component Value Date/Time   PROT 6.6 04/18/2019 1220   PROT 6.8 09/23/2013 0932   ALBUMIN 4.0 04/18/2019 1220   ALBUMIN 3.6 09/23/2013 0932   AST 20 04/18/2019 1220   AST 18 09/23/2013 0932   ALT 20 04/18/2019 1220   ALT 20 09/23/2013 0932   ALKPHOS 107 04/18/2019 1220   ALKPHOS 87 09/23/2013 0932   BILITOT 0.2  04/18/2019 1220   BILITOT 0.38 09/23/2013 0932   No results found for: TSH   No results found for: Vitamin D, 25-Hydroxy  OBESITY BEHAVIORAL INTERVENTION VISIT  Today's visit was #2  Starting weight: 305 lbs Starting date: 04/18/2019 Today's weight: 299 lbs  Today's date: 05/02/2019 Total lbs lost to date: 6   05/02/2019  Height 5\' 8"  (1.727 m)  Weight 299 lb (135.6 kg)  BMI (Calculated) 45.47  BLOOD PRESSURE - SYSTOLIC 854  BLOOD PRESSURE - DIASTOLIC 73   Body Fat % 62.7 %   ASK: We discussed the diagnosis of obesity with Gillie Manners today and Loma Sousa agreed to give [place] permission to discuss obesity behavioral modification therapy today.  ASSESS: Emeli has the diagnosis of obesity and her BMI today is 45.5. Haniah is in the action stage of change.   ADVISE: Ranee was educated on the multiple health risks of obesity as well as the benefit of weight loss to improve her health. She was advised of the need for long term treatment and the importance of lifestyle modifications to improve her current health and to decrease her risk of future health problems.  AGREE: Multiple dietary modification options and treatment options were discussed and  Annalysa agreed to follow the recommendations documented in the above note.  ARRANGE: Bethanny was educated on the importance of frequent visits to treat obesity as outlined per CMS and USPSTF guidelines and agreed to schedule her next follow up appointment today.  Migdalia Dk, am acting as Location manager for [employer], DO  I have reviewed the above documentation for accuracy and completeness, and I agree with the above. -Jearld Lesch, DO

## 2019-05-03 NOTE — Progress Notes (Signed)
Reason for the request:    Anemia  HPI: I was asked by Dr. Tiburcio PeaHarris   to evaluate Isabella Larson for iron deficiency anemia.  She is a 38 year old woman with history of gastric bypass surgery with chronic recurrent iron deficiency anemia dating back to at least 2014.  At that time she was evaluated by Dr. Darrold SpanLivesay and received iron and dextran for a total of 1000 mg back in 2014.  She has required intermittent intravenous iron every 2 to 3 years since that time.  She had a laboratory testing done in July 2020 by her primary care provider which showed hemoglobin of 9.1 with an MCV 71.9.  RDW was elevated at 16.0 with normal white cell count and platelets.  Iron level was low at 14 with saturation of 3%.  Iron binding capacity was elevated at 514.  Vitamin B12 level was normal.  Based on these findings she received intravenous iron in the form of Feraheme at a total of 1020 mg total dose and to split infusions given on July 17 and April 15, 2019.  She had been on oral iron with escalating doses at different formulation without any benefits.  She also had poor tolerance to it with dyspepsia, abdominal pain and constipation.  Clinically, she reports feeling well after the IV iron infusion.  She had reported some fatigue and tiredness and skin changes which also all improved after intravenous iron.  She denies any heavy menstrual cycles or menorrhagia.  She does not report any headaches, blurry vision, syncope or seizures. Does not report any fevers, chills or sweats.  Does not report any cough, wheezing or hemoptysis.  Does not report any chest pain, palpitation, orthopnea or leg edema.  Does not report any nausea, vomiting or abdominal pain.  Does not report any constipation or diarrhea.  Does not report any skeletal complaints.    Does not report frequency, urgency or hematuria.  Does not report any skin rashes or lesions. Does not report any heat or cold intolerance.  Does not report any lymphadenopathy or petechiae.   Does not report any anxiety or depression.  Remaining review of systems is negative.    Past Medical History:  Diagnosis Date  . Anemia   . Back pain   . Edema of both feet   . Hypothyroidism   . Vitamin B 12 deficiency   . Vitamin D deficiency   :  Past Surgical History:  Procedure Laterality Date  . mini gastric bypass    :   Current Outpatient Medications:  .  cyanocobalamin (,VITAMIN B-12,) 1000 MCG/ML injection, Inject 1,000 mcg into the muscle every 30 (thirty) days., Disp: , Rfl:  .  cyclobenzaprine (FLEXERIL) 10 MG tablet, Take 1 tablet (10 mg total) by mouth 3 (three) times daily as needed for muscle spasms., Disp: 50 tablet, Rfl: 0 .  diclofenac (VOLTAREN) 75 MG EC tablet, Take 1 tablet (75 mg total) by mouth 2 (two) times daily., Disp: 30 tablet, Rfl: 2 .  diclofenac sodium (VOLTAREN) 1 % GEL, Apply 2 g topically 4 (four) times daily., Disp: 1 Tube, Rfl: 5 .  ELDERBERRY PO, Take by mouth., Disp: , Rfl:  .  ferrous fumarate (HEMOCYTE - 106 MG FE) 325 (106 FE) MG TABS tablet, Take 1 tablet (106 mg of iron total) by mouth daily. Take on an empty stomach with OJ, Disp: 30 each, Rfl: 5 .  levothyroxine (SYNTHROID, LEVOTHROID) 150 MCG tablet, Take 300 mcg by mouth daily before breakfast., Disp: ,  Rfl:  .  methylPREDNISolone (MEDROL) 4 MG tablet, Take as directed, Disp: 21 tablet, Rfl: 0 .  Vitamin D, Ergocalciferol, (DRISDOL) 1.25 MG (50000 UT) CAPS capsule, Take 50,000 Units by mouth every 7 (seven) days., Disp: , Rfl: :  No Known Allergies:  Family History  Problem Relation Age of Onset  . High Cholesterol Mother   . Cancer Mother   . Sleep apnea Mother   . Obesity Mother   . Obesity Father   . Sleep apnea Father   . High Cholesterol Father   :  Social History   Socioeconomic History  . Marital status: Single    Spouse name: Not on file  . Number of children: Not on file  . Years of education: Not on file  . Highest education level: Not on file  Occupational  History  . Occupation: Sports coach  Social Needs  . Financial resource strain: Not on file  . Food insecurity    Worry: Not on file    Inability: Not on file  . Transportation needs    Medical: Not on file    Non-medical: Not on file  Tobacco Use  . Smoking status: Never Smoker  . Smokeless tobacco: Never Used  Substance and Sexual Activity  . Alcohol use: Not on file  . Drug use: Not on file  . Sexual activity: Not on file  Lifestyle  . Physical activity    Days per week: Not on file    Minutes per session: Not on file  . Stress: Not on file  Relationships  . Social Herbalist on phone: Not on file    Gets together: Not on file    Attends religious service: Not on file    Active member of club or organization: Not on file    Attends meetings of clubs or organizations: Not on file    Relationship status: Not on file  . Intimate partner violence    Fear of current or ex partner: Not on file    Emotionally abused: Not on file    Physically abused: Not on file    Forced sexual activity: Not on file  Other Topics Concern  . Not on file  Social History Narrative  . Not on file  :  Pertinent items are noted in HPI.  Exam: Blood pressure 132/70, pulse 75, temperature 98.2 F (36.8 C), temperature source Oral, resp. rate 17, height 5\' 8"  (1.727 m), weight (!) 302 lb 6.4 oz (137.2 kg), SpO2 100 %.  ECOG 0  General appearance: alert and cooperative appeared without distress. Head: atraumatic without any abnormalities. Eyes: conjunctivae/corneas clear. PERRL.  Sclera anicteric. Throat: lips, mucosa, and tongue normal; without oral thrush or ulcers. Resp: clear to auscultation bilaterally without rhonchi, wheezes or dullness to percussion. Cardio: regular rate and rhythm, S1, S2 normal, no murmur, click, rub or gallop GI: soft, non-tender; bowel sounds normal; no masses,  no organomegaly Skin: Skin color, texture, turgor normal. No rashes or  lesions Lymph nodes: Cervical, supraclavicular, and axillary nodes normal. Neurologic: Grossly normal without any motor, sensory or deep tendon reflexes. Musculoskeletal: No joint deformity or effusion.  CBC    Component Value Date/Time   WBC 5.3 09/23/2013 0932   RBC 4.42 09/23/2013 0932   HGB 11.5 (L) 09/23/2013 0932   HCT 36.3 09/23/2013 0932   PLT 214 09/23/2013 0932   MCV 82.1 09/23/2013 0932   MCH 26.0 09/23/2013 0932   MCHC 31.7 09/23/2013 0932  RDW 19.0 (H) 09/23/2013 0932   LYMPHSABS 2.1 09/23/2013 0932   MONOABS 0.4 09/23/2013 0932   EOSABS 0.1 09/23/2013 0932   BASOSABS 0.0 09/23/2013 0932     Assessment and Plan:    38 year old woman with:  1.  Iron deficiency anemia dating back to at least 2014 related to chronic menstrual blood losses in addition to poor absorption after gastric bypass operation.  She is status post intravenous iron in the form of INFeD as well as recently Feraheme for a total of 1000 mg.  The natural course of this disease and treatment options were reiterated.  Given the inefficient oral iron supplementation and her as well as her poor tolerance I do not recommend attempting to replace her iron orally in the future.  She developed poor tolerance of poor deficiency related to absorption issues in the past.  I recommended intermittent monitoring of her iron and repeat intravenous iron as needed.  Different formulations were also discussed today including INFeD, Feraheme as well as Venofer.  I have suggested continuing Feraheme if it is possible immune deficiency of infusion and better success.  Risks and benefits of all these approaches as well as complications related to INFeD including anaphylaxis at length of infusion was discussed.  She is currently getting her blood checked periodically by her primary care provider and I am happy to assist in her care as needed in the future.  2.  Follow-up: As needed in the future.  40  minutes was spent  with the patient face-to-face today.  More than 50% of time was spent on reviewing her disease status, reviewing laboratory data, treatment options complications related therapy.    Thank you for the referral.  A copy of this consult has been forwarded to the requesting physician.

## 2019-05-04 DIAGNOSIS — E661 Drug-induced obesity: Secondary | ICD-10-CM | POA: Insufficient documentation

## 2019-05-04 DIAGNOSIS — Z6841 Body Mass Index (BMI) 40.0 and over, adult: Secondary | ICD-10-CM | POA: Insufficient documentation

## 2019-05-11 NOTE — Progress Notes (Signed)
Office: [phone]  /  Fax: [phone]    Date: May 16, 2019    Appointment Start Time: 2:32pm Duration: 28 minutes Provider: Lawerance CruelGaytri Barker, Psy.D. Type of Session: Individual Therapy  Location of Patient: Home Location of Provider: [facility] Type of Contact: Telepsychological Visit via [employer]   Session Content: Isabella Larson is a 38 y.o. female presenting via [employer] Web[employer] for a follow-up appointment to address the previously established treatment goal of decreasing emotional eating. Today's appointment was a telepsychological visit, as this provider's clinic is seeing a limited number of patients for in-person visits due to COVID-19. Therapeutic services will resume to in-person appointments once deemed appropriate. Phylisha expressed understanding regarding the rationale for telepsychological services, and provided verbal consent for today's appointment. Prior to proceeding with today's appointment, [place]'s physical location at the time of this appointment was obtained. Isabella Larson reported she was at home and provided the address. In the event of technical difficulties, Isabella Larson shared a phone number she could be reached at. Isabella Larson and this provider participated in today's telepsychological service. Also, Isabella Larson denied anyone else being present in the room or on the WebEx appointment.  This provider conducted a brief check-in and verbally administered the PHQ-9 and GAD-7. Isabella Larson reported "struggling" due to her menstrual cycle and recalled experiencing cravings. She discussed worry about judgment from her father. It was identified she often judges herself; therefore, this provider explored what she would tell a close friend if they were going through something similar. She described telling them it would be okay. In addition, psychoeducation regarding all or nothing thinking was provided. Regarding emotional eating, Isabella Larson described engaging in it outside of  her menstrual cycle. As such, psychoeducation regarding triggers for emotional eating was provided. Isabella Larson was provided a handout, and encouraged to utilize the handout between now and the next appointment to increase awareness of triggers and frequency. Natasja agreed. This provider also discussed behavioral strategies for specific triggers, such as placing the utensil down when conversing to avoid mindless eating. Makhayla provided verbal consent during today's appointment for this provider to send the handout on triggers via e-mail. Overall, Isabella Larson was receptive to today's session as evidenced by openness to sharing, responsiveness to feedback, and willingness to explore triggers for emotional eating.  Mental Status Examination:  Appearance: neat Behavior: cooperative Mood: euthymic Affect: mood congruent Speech: normal in rate, volume, and tone Eye Contact: appropriate Psychomotor Activity: appropriate Thought Process: linear, logical, and goal directed  Content/Perceptual Disturbances: no hallucinations, delusions, bizarre thinking or behavior reported or observed and no evidence of suicidal and homicidal ideation, plan, and intent Orientation: time, person, place and purpose of appointment Cognition/Sensorium: memory, attention, language, and fund of knowledge intact  Insight: good Judgment: good  Structured Assessment Results: The Patient Health Questionnaire-9 (PHQ-9) is a self-report measure that assesses symptoms and severity of depression over the course of the last two weeks. Angeligue obtained a score of 3 suggesting minimal depression. Isabella Larson finds the endorsed symptoms to be not difficult at all. Little interest or pleasure in doing things 0  Feeling down, depressed, or hopeless 0  Trouble falling or staying asleep, or sleeping too much 0  Feeling tired or having little energy 1  Poor appetite or overeating 1  Feeling bad about yourself --- or that you are a failure or  have let yourself or your family down 1  Trouble concentrating on things, such as reading the newspaper or watching television 0  Moving or speaking so slowly that  other people could have noticed? Or the opposite --- being so fidgety or restless that you have been moving around a lot more than usual 0  Thoughts that you would be better off dead or hurting yourself in some way 0  PHQ-9 Score 3    The Generalized Anxiety Disorder-7 (GAD-7) is a brief self-report measure that assesses symptoms of anxiety over the course of the last two weeks. Kambry obtained a score of 0. Feeling nervous, anxious, on edge 0  Not being able to stop or control worrying 0  Worrying too much about different things 0  Trouble relaxing 0  Being so restless that it's hard to sit still 0  Becoming easily annoyed or irritable 0  Feeling afraid as if something awful might happen 0  GAD-7 Score 0   Interventions:  Conducted a brief chart review Verbal administration of PHQ-9 and GAD-7 for symptom monitoring Provided empathic reflections and validation Reviewed content from the previous session Psychoeducation provided regarding triggers for emotional eating Focused on rapport building Psychoeducation provided regarding all-or-nothing thinking Employed supportive psychotherapy interventions to facilitate reduced distress, and to improve coping skills with identified stressors  DSM-5 Diagnosis: 311 (F32.8) Other Specified Depressive Disorder, Emotional Eating Behaviors  Treatment Goal & Progress: During the initial appointment with this provider, the following treatment goal was established: decrease emotional eating. Progress is limited, as Hagen has just begun treatment with this provider; however, she is receptive to the interaction and interventions and rapport is being established.   Plan: Fredrica continues to appear able and willing to participate as evidenced by engagement in reciprocal conversation, and  asking questions for clarification as appropriate. The next appointment will be scheduled in three weeks, which will be via [employer]. The next session will focus on the introduction of mindfulness.

## 2019-05-16 ENCOUNTER — Other Ambulatory Visit: Payer: Self-pay

## 2019-05-16 ENCOUNTER — Ambulatory Visit (INDEPENDENT_AMBULATORY_CARE_PROVIDER_SITE_OTHER): Payer: BC Managed Care – PPO | Admitting: Psychology

## 2019-05-16 DIAGNOSIS — F3289 Other specified depressive episodes: Secondary | ICD-10-CM

## 2019-05-18 ENCOUNTER — Other Ambulatory Visit: Payer: Self-pay

## 2019-05-18 ENCOUNTER — Ambulatory Visit (INDEPENDENT_AMBULATORY_CARE_PROVIDER_SITE_OTHER): Payer: BC Managed Care – PPO | Admitting: Bariatrics

## 2019-05-18 ENCOUNTER — Encounter (INDEPENDENT_AMBULATORY_CARE_PROVIDER_SITE_OTHER): Payer: Self-pay | Admitting: Bariatrics

## 2019-05-18 VITALS — BP 113/77 | HR 61 | Temp 98.0°F | Ht 68.0 in | Wt 297.0 lb

## 2019-05-18 DIAGNOSIS — Z9189 Other specified personal risk factors, not elsewhere classified: Secondary | ICD-10-CM | POA: Diagnosis not present

## 2019-05-18 DIAGNOSIS — R7303 Prediabetes: Secondary | ICD-10-CM

## 2019-05-18 DIAGNOSIS — Z6841 Body Mass Index (BMI) 40.0 and over, adult: Secondary | ICD-10-CM

## 2019-05-18 DIAGNOSIS — E559 Vitamin D deficiency, unspecified: Secondary | ICD-10-CM | POA: Diagnosis not present

## 2019-05-18 DIAGNOSIS — Z9884 Bariatric surgery status: Secondary | ICD-10-CM | POA: Diagnosis not present

## 2019-05-19 ENCOUNTER — Encounter (INDEPENDENT_AMBULATORY_CARE_PROVIDER_SITE_OTHER): Payer: Self-pay | Admitting: Bariatrics

## 2019-05-19 NOTE — Progress Notes (Signed)
Office: [phone]  /  Fax: [phone]   HPI:   Chief Complaint: OBESITY Isabella Larson is here to discuss her progress with her obesity treatment plan. She is on the Category 4 plan and is following her eating plan approximately 50-60 % of the time. She states she is exercising 0 minutes 0 times per week. Isabella Larson is down 2 lbs, but she is under more stress.  Her weight is 297 lb (134.7 kg) today and has had a weight loss of 2 pounds over a period of 2 weeks since her last visit. She has lost 8 lbs since starting treatment with us.  Vitamin D Deficiency Isabella Larson has a diagnosis of vitamin D deficiency. She is currently taking high dose prescription Vit D. Last Vit D level was 18.62. She denies nausea, vomiting or muscle weakness.  At risk for osteopenia and osteoporosis Isabella Larson is at higher risk of osteopenia and osteoporosis due to vitamin D deficiency.   Pre-Diabetes Isabella Larson has a new diagnosis of pre-diabetes based on her elevated Hgb A1c and was informed this puts her at greater risk of developing diabetes. Last A1c was 5.8 and insulin of 32.5. She is not taking metformin currently and she is taking breakfast with her, but notes she is doing more snacking. She continues to work on diet and exercise to decrease risk of diabetes. She denies polyphagia or hypoglycemia.  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  ASSESSMENT AND PLAN:  Vitamin D deficiency  Prediabetes  History of gastric bypass  At risk for osteoporosis  Class 3 severe obesity with serious comorbidity and body mass index (BMI) 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.9 in adult, unspecified obesity type (HCC)  PLAN:  Vitamin D Deficiency Isabella Larson was informed that low vitamin D levels contributes to fatigue and are associated with obesity, breast, and colon cancer. Isabella Larson agrees to continue taking prescription Vit D 50,000 IU every week and will follow up for routine testing of  vitamin D, at least 2-3 times per year. She was informed of the risk of over-replacement of vitamin D and agrees to not increase her dose unless she discusses this with us first. Isabella Larson agrees to follow up with our clinic in 3 to 4 weeks.  At risk for osteopenia and osteoporosis Isabella Larson was given extended (15 minutes) osteoporosis prevention counseling today. Isabella Larson is at risk for osteopenia and osteoporsis due to her vitamin D deficiency. She was encouraged to take her vitamin D and follow her higher calcium diet and increase strengthening exercise to help strengthen her bones and decrease her risk of osteopenia and osteoporosis.  Pre-Diabetes Isabella Larson will continue to work on weight loss, exercise, and decreasing simple carbohydrates in her diet to help decrease the risk of diabetes. She is to take breakfast foods and protein sources. We dicussed metformin including benefits and risks. She was informed that eating too many simple carbohydrates or too many calories at one sitting increases the likelihood of GI side effects. Alanda declined metformin for now and a prescription was not written today. Isabella Larson agrees to follow up with our clinic in 3 to 4 weeks as directed to monitor her progress.  History of Gastric Bypass Isabella Larson is to increase protein, add more fruit with meals, and increase fiber. She is to add fiber with 8 oz of water.  Obesity Isabella Larson is currently in the action stage of change. As such, her goal is to continue with weight loss efforts She has agreed to  follow the Category 4 plan, and increase protein to 30 grams Isabella Larson has been instructed to work up to a goal of 150 minutes of combined cardio and strengthening exercise per week for weight loss and overall health benefits. We discussed the following Behavioral Modification Strategies today: increasing lean protein intake, decreasing simple carbohydrates , increasing vegetables, decrease eating out and work on meal  planning and easy cooking plans, increase H20 intake, no skipping meals, keeping healthy foods in the home, and planning for success   Isabella Larson has agreed to follow up with our clinic in 3 to 4 weeks. She was informed of the importance of frequent follow up visits to maximize her success with intensive lifestyle modifications for her multiple health conditions.  ALLERGIES: No Known Allergies  MEDICATIONS: Current Outpatient Medications on File Prior to Visit  Medication Sig Dispense Refill  . cyanocobalamin (,VITAMIN B-12,) 1000 MCG/ML injection Inject 1,000 mcg into the muscle every 30 (thirty) days.    . cyclobenzaprine (FLEXERIL) 10 MG tablet Take 1 tablet (10 mg total) by mouth 3 (three) times daily as needed for muscle spasms. 50 tablet 0  . diclofenac (VOLTAREN) 75 MG EC tablet Take 1 tablet (75 mg total) by mouth 2 (two) times daily. 30 tablet 2  . diclofenac sodium (VOLTAREN) 1 % GEL Apply 2 g topically 4 (four) times daily. 1 Tube 5  . ELDERBERRY PO Take by mouth.    . ferrous fumarate (HEMOCYTE - 106 MG FE) 325 (106 FE) MG TABS tablet Take 1 tablet (106 mg of iron total) by mouth daily. Take on an empty stomach with OJ 30 each 5  . levothyroxine (SYNTHROID, LEVOTHROID) 150 MCG tablet Take 300 mcg by mouth daily before breakfast.    . methylPREDNISolone (MEDROL) 4 MG tablet Take as directed 21 tablet 0  . Vitamin D, Ergocalciferol, (DRISDOL) 1.25 MG (50000 UT) CAPS capsule Take 50,000 Units by mouth every 7 (seven) days.     No current facility-administered medications on file prior to visit.     PAST MEDICAL HISTORY: Past Medical History:  Diagnosis Date  . Anemia   . Back pain   . Edema of both feet   . Hypothyroidism   . Vitamin B 12 deficiency   . Vitamin D deficiency     PAST SURGICAL HISTORY: Past Surgical History:  Procedure Laterality Date  . mini gastric bypass      SOCIAL HISTORY: Social History   Tobacco Use  . Smoking status: Never Smoker  .  Smokeless tobacco: Never Used  Substance Use Topics  . Alcohol use: Not on file  . Drug use: Not on file    FAMILY HISTORY: Family History  Problem Relation Age of Onset  . High Cholesterol Mother   . Cancer Mother   . Sleep apnea Mother   . Obesity Mother   . Obesity Father   . Sleep apnea Father   . High Cholesterol Father     ROS: Review of Systems  Constitutional: Positive for weight loss.  Gastrointestinal: Negative for nausea and vomiting.  Musculoskeletal:       Negative muscle weakness  Endo/Heme/Allergies:       Negative hypoglycemia Negative polyphagia    PHYSICAL EXAM: Blood pressure 113/77, pulse 61, temperature 98 F (36.7 C), temperature source Oral, height 5\' 8"  (1.727 m), weight 297 lb (134.7 kg), last menstrual period 05/02/2019, SpO2 97 %. Body mass index is 45.16 kg/m. Physical Exam Vitals signs reviewed.  Constitutional:  Appearance: Normal appearance. She is obese.  Cardiovascular:     Rate and Rhythm: Normal rate.     Pulses: Normal pulses.  Pulmonary:     Effort: Pulmonary effort is normal.     Breath sounds: Normal breath sounds.  Musculoskeletal: Normal range of motion.  Skin:    General: Skin is warm and dry.  Neurological:     Mental Status: She is alert and oriented to person, place, and time.  Psychiatric:        Mood and Affect: Mood normal.        Behavior: Behavior normal.     RECENT LABS AND TESTS: BMET    Component Value Date/Time   NA 140 04/18/2019 1220   NA 141 09/23/2013 0932   K 4.7 04/18/2019 1220   K 3.7 09/23/2013 0932   CL 99 04/18/2019 1220   CO2 23 04/18/2019 1220   CO2 23 09/23/2013 0932   GLUCOSE 100 (H) 04/18/2019 1220   GLUCOSE 97 09/23/2013 0932   BUN 10 04/18/2019 1220   BUN 11.6 09/23/2013 0932   CREATININE 0.63 04/18/2019 1220   CREATININE 0.7 09/23/2013 0932   CALCIUM 9.0 04/18/2019 1220   CALCIUM 8.8 09/23/2013 0932   GFRNONAA 116 04/18/2019 1220   GFRAA 133 04/18/2019 1220   Lab  Results  Component Value Date   HGBA1C 5.8 (H) 04/18/2019   Lab Results  Component Value Date   INSULIN 15.7 04/18/2019   CBC    Component Value Date/Time   WBC 5.3 09/23/2013 0932   RBC 4.42 09/23/2013 0932   HGB 11.5 (L) 09/23/2013 0932   HCT 36.3 09/23/2013 0932   PLT 214 09/23/2013 0932   MCV 82.1 09/23/2013 0932   MCH 26.0 09/23/2013 0932   MCHC 31.7 09/23/2013 0932   RDW 19.0 (H) 09/23/2013 0932   LYMPHSABS 2.1 09/23/2013 0932   MONOABS 0.4 09/23/2013 0932   EOSABS 0.1 09/23/2013 0932   BASOSABS 0.0 09/23/2013 0932   Iron/TIBC/Ferritin/ %Sat No results found for: IRON, TIBC, FERRITIN, IRONPCTSAT Lipid Panel     Component Value Date/Time   CHOL 183 04/18/2019 1220   TRIG 103 04/18/2019 1220   HDL 60 04/18/2019 1220   LDLCALC 102 (H) 04/18/2019 1220   Hepatic Function Panel     Component Value Date/Time   PROT 6.6 04/18/2019 1220   PROT 6.8 09/23/2013 0932   ALBUMIN 4.0 04/18/2019 1220   ALBUMIN 3.6 09/23/2013 0932   AST 20 04/18/2019 1220   AST 18 09/23/2013 0932   ALT 20 04/18/2019 1220   ALT 20 09/23/2013 0932   ALKPHOS 107 04/18/2019 1220   ALKPHOS 87 09/23/2013 0932   BILITOT 0.2 04/18/2019 1220   BILITOT 0.38 09/23/2013 0932   No results found for: TSH    OBESITY BEHAVIORAL INTERVENTION VISIT  Today's visit was # 3   Starting weight: 305 lbs Starting date: 04/18/2019 Today's weight : 297 lbs Today's date: 05/18/2019 Total lbs lost to date: 8    ASK: We discussed the diagnosis of obesity with Gillie Manners today and Loma Sousa agreed to give [place] permission to discuss obesity behavioral modification therapy today.  ASSESS: Kseniya has the diagnosis of obesity and her BMI today is 45.17 Reginia is in the action stage of change   ADVISE: Khali was educated on the multiple health risks of obesity as well as the benefit of weight loss to improve her health. She was advised of the need for long term treatment and the importance  of  lifestyle modifications to improve her current health and to decrease her risk of future health problems.  AGREE: Multiple dietary modification options and treatment options were discussed and  Emmani agreed to follow the recommendations documented in the above note.  ARRANGE: Jedidah was educated on the importance of frequent visits to treat obesity as outlined per CMS and USPSTF guidelines and agreed to schedule her next follow up appointment today.  Trude Mcburney, am acting as transcriptionist for [employer], DO  I have reviewed the above documentation for accuracy and completeness, and I agree with the above. -Corinna Capra, DO

## 2019-05-31 NOTE — Progress Notes (Signed)
Office: [phone]  /  Fax: [phone]    Date: June 08, 2019   Appointment Start Time: 4:31pm Duration: 27 minutes Provider: Glennie Isle, Psy.D. Type of Session: Individual Therapy  Location of Patient: Home Location of Provider: Provider's Home Type of Contact: Telepsychological Visit via [employer] Web[employer]   Session Content: Roslyn is a 38 y.o. female presenting via [place] for a follow-up appointment to address the previously established treatment goal of decreasing emotional eating. Today's appointment was a telepsychological visit, as this provider's clinic is seeing a limited number of patients for in-person visits due to COVID-19. Therapeutic services will resume to in-person appointments once deemed appropriate. Sharnika expressed understanding regarding the rationale for telepsychological services, and provided verbal consent for today's appointment. Prior to proceeding with today's appointment, Yaritzel's physical location at the time of this appointment was obtained. Chamille reported she was at home and provided the address. In the event of technical difficulties, Shareka shared a phone number she could be reached at. Kenzlie and this provider participated in today's telepsychological service. Also, Garnett denied anyone else being present in the room or on the WebEx appointment.  This provider conducted a brief check-in and verbally administered the PHQ-9 and GAD-7. Zellie shared she incorporated walking in her routine and noted, "I feel better." She added she is "working on all or nothing thinking." Positive reinforcement was provided. She shared she continues to struggle with all or nothing thinking, but noted having success. Minha acknowledged emotional eating at the end of the day and explained she chooses foods from her meal plan. Despite episodes of emotional eating, she noted an overall reduction since the onset of treatment with this provider. To further  assist with all or nothing thinking, psychoeducation regarding thought defusion, including its impact on emotional eating and overall well-being was provided. Chellie was led through a thought defusion exercise, and a handout with various exercises was provided. Zeta was encouraged to engage in the thought defusion exercises between now and the next appointment with this provider. Laporchia agreed. The following thought was used for today's exercise: "I am out of control." Her experience after was processed. Raysha noted, "I can tell the difference." She described her stomach clenching up and noted she started experiencing anxiety initially. However, as the exercise progressed, she shared, "Not as daunting." Perry provided verbal consent during today's appointment for this provider to send a handout with thought defusion exercises via e-mail.  Victorious was receptive to today's session as evidenced by openness to sharing, responsiveness to feedback, and willingness to engage in thought defusion exercises.  Mental Status Examination:  Appearance: neat Behavior: cooperative Mood: euthymic Affect: mood congruent Speech: normal in rate, volume, and tone Eye Contact: appropriate Psychomotor Activity: appropriate Thought Process: linear, logical, and goal directed  Content/Perceptual Disturbances: no hallucinations, delusions, bizarre thinking or behavior reported or observed and no evidence of suicidal and homicidal ideation, plan, and intent Orientation: time, person, place and purpose of appointment Cognition/Sensorium: memory, attention, language, and fund of knowledge intact  Insight: good Judgment: good  Structured Assessment Results: The Patient Health Questionnaire-9 (PHQ-9) is a self-report measure that assesses symptoms and severity of depression over the course of the last two weeks. Arieon obtained a score of 1 suggesting minimal depression. Stephanieann finds the endorsed symptoms to be  not difficult at all. Little interest or pleasure in doing things 0  Feeling down, depressed, or hopeless 0  Trouble falling or staying asleep, or sleeping too much 0  Feeling tired or  having little energy 0  Poor appetite or overeating 1  Feeling bad about yourself --- or that you are a failure or have let yourself or your family down 0  Trouble concentrating on things, such as reading the newspaper or watching television 0  Moving or speaking so slowly that other people could have noticed? Or the opposite --- being so fidgety or restless that you have been moving around a lot more than usual 0  Thoughts that you would be better off dead or hurting yourself in some way 0  PHQ-9 Score 1    The Generalized Anxiety Disorder-7 (GAD-7) is a brief self-report measure that assesses symptoms of anxiety over the course of the last two weeks. Zanaria obtained a score of 0. Feeling nervous, anxious, on edge 0  Not being able to stop or control worrying 0  Worrying too much about different things 0  Trouble relaxing 0  Being so restless that it's hard to sit still 0  Becoming easily annoyed or irritable 0  Feeling afraid as if something awful might happen 0  GAD-7 Score 0   Interventions:  Conducted a brief chart review Verbal administration of PHQ-9 and GAD-7 for symptom monitoring Provided empathic reflections and validation Reviewed content from the previous session Psychoeducation provided regarding thought defusion Engaged patient in a thought defusion exercise Provided positive reinforcement Employed supportive psychotherapy interventions to facilitate reduced distress, and to improve coping skills with identified stressors Employed acceptance and commitment interventions to emphasize mindfulness and acceptance without struggle  DSM-5 Diagnosis: 311 (F32.8) Other Specified Depressive Disorder, Emotional Eating Behaviors  Treatment Goal & Progress: During the initial appointment with  this provider, the following treatment goal was established: decrease emotional eating. [name] has demonstrated progress in her goal as evidenced by increased awareness of hunger patterns and triggers for emotional eating. [name] also reported a reduction in emotional eating and demonstrates willingness to engage in thought defusion exercises.  Plan: [name] continues to appear able and willing to participate as evidenced by engagement in reciprocal conversation, and asking questions for clarification as appropriate. The next appointment will be scheduled in two weeks, which will be via [employer] Web[employer]. The next session will focus further on thought defusion.

## 2019-06-08 ENCOUNTER — Ambulatory Visit (INDEPENDENT_AMBULATORY_CARE_PROVIDER_SITE_OTHER): Payer: BC Managed Care – PPO | Admitting: Psychology

## 2019-06-08 ENCOUNTER — Other Ambulatory Visit: Payer: Self-pay

## 2019-06-08 DIAGNOSIS — F3289 Other specified depressive episodes: Secondary | ICD-10-CM | POA: Diagnosis not present

## 2019-06-08 NOTE — Progress Notes (Signed)
Office: [phone]  /  Fax: [phone]    Date: June 22, 2019   Appointment Start Time: 9:00am Duration: 27 minutes Provider: Glennie Isle, Psy.D. Type of Session: Individual Therapy  Location of Patient: Home Location of Provider: Provider's Home Type of Contact: Telepsychological Visit via [employer] Web[employer]   Session Content: Isabella Larson is a 38 y.o. female presenting via [place] for a follow-up appointment to address the previously established treatment goal of decreasing emotional eating. Today's appointment was a telepsychological visit, as it is an option for appointments to reduce exposure to COVID-19. Isabella Larson expressed understanding regarding the rationale for telepsychological services, and provided verbal consent for today's appointment. Prior to proceeding with today's appointment, Isabella Larson's physical location at the time of this appointment was obtained. Isabella Larson reported she was at home and provided the address. In the event of technical difficulties, Isabella Larson shared a phone number she could be reached at. Isabella Larson and this provider participated in today's telepsychological service. Also, Isabella Larson denied anyone else being present in the room or on the WebEx appointment.  This provider conducted a brief check-in and verbally administered the PHQ-9 and GAD-7. Isabella Larson shared she was prescribed Wellbutrin and she had an "allergic reaction" to it. She informed [name], CMA and she was told to discontinue it. Notably, she disclosed increased appetite after discontinuing Wellbutrin and described "feeling out of control." The aforementioned has ceased. 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 She reported an overall reduction in emotional eating. Regarding thought defusion, Isabella Larson discussed engaging in thought defusion to help cope with emotional hunger and conflict at work. She was led through an exercise, "Leaves on a  Stream," during today's appointment. Her experience was processed. Isabella Larson noted, "It was relaxing. It was different." She reported it was difficult to "let thoughts go" and she described being hooked on the thoughts at times. Isabella Larson provided verbal consent during today's appointment for this provider to send the handout for today's exercise via e-mail. She was agreeable to engaging in the exercise once a day between now and the next appointment with this provider to assist with coping. Based on recent progress, frequency of appointments will be reduced; Isabella Larson was agreeable to this plan. Overall, Isabella Larson was receptive to today's session as evidenced by openness to sharing, responsiveness to feedback, and willingness to continue engaging in thought defusion exercises.  Mental Status Examination:  Appearance: neat Behavior: cooperative Mood: euthymic Affect: mood congruent Speech: normal in rate, volume, and tone Eye Contact: appropriate Psychomotor Activity: appropriate Thought Process: linear, logical, and goal directed  Content/Perceptual Disturbances: no hallucinations, delusions, bizarre thinking or behavior reported or observed and no evidence of suicidal and homicidal ideation, plan, and intent Orientation: time, person, place and purpose of appointment Cognition/Sensorium: memory, attention, language, and fund of knowledge intact  Insight: good Judgment: good  Structured Assessment Results: The Patient Health Questionnaire-9 (PHQ-9) is a self-report measure that assesses symptoms and severity of depression over the course of the last two weeks. Tamecia obtained a score of 1 suggesting minimal depression. Larcenia finds the endorsed symptoms to be not difficult at all. Little interest or pleasure in doing things 0  Feeling down, depressed, or hopeless 0  Trouble falling or staying asleep, or sleeping too much 1  Feeling tired or having little energy 0  Poor appetite or overeating 0   Feeling bad about yourself --- or that you are a failure or have let yourself or your family down  0  Trouble concentrating on things, such as reading the newspaper or watching television 0  Moving or speaking so slowly that other people could have noticed? Or the opposite --- being so fidgety or restless that you have been moving around a lot more than usual 0  Thoughts that you would be better off dead or hurting yourself in some way 0  PHQ-9 Score 1    The Generalized Anxiety Disorder-7 (GAD-7) is a brief self-report measure that assesses symptoms of anxiety over the course of the last two weeks. Sion obtained a score of 0. Feeling nervous, anxious, on edge 0  Not being able to stop or control worrying 0  Worrying too much about different things 0  Trouble relaxing 0  Being so restless that it's hard to sit still 0  Becoming easily annoyed or irritable 0  Feeling afraid as if something awful might happen 0  GAD-7 Score 0   Interventions:  Conducted a brief chart review Verbal administration of PHQ-9 and GAD-7 for symptom monitoring Provided empathic reflections and validation Reviewed content from the previous session Engaged patient in a thought defusion exercise Provided positive reinforcement Employed supportive psychotherapy interventions to facilitate reduced distress, and to improve coping skills with identified stressors Employed acceptance and commitment interventions to emphasize mindfulness and acceptance without struggle  DSM-5 Diagnosis: 311 (F32.8) Other Specified Depressive Disorder, Emotional Eating Behaviors  Treatment Goal & Progress: During the initial appointment with this provider, the following treatment goal was established: decrease emotional eating. Isabella Larson has demonstrated progress in her goal as evidenced by increased awareness of hunger patterns and triggers for emotional eating. Isabella Larson also reported a reduction in emotional eating and demonstrates  willingness to engage in thought defusion exercises.  Plan: Isabella Larson continues to appear able and willing to participate as evidenced by engagement in reciprocal conversation, and asking questions for clarification as appropriate. The next appointment will be scheduled in three weeks, which will be via [employer] Web[employer]. The next session will focus on the introduction of mindfulness.

## 2019-06-09 ENCOUNTER — Encounter (INDEPENDENT_AMBULATORY_CARE_PROVIDER_SITE_OTHER): Payer: Self-pay | Admitting: Bariatrics

## 2019-06-09 ENCOUNTER — Ambulatory Visit (INDEPENDENT_AMBULATORY_CARE_PROVIDER_SITE_OTHER): Payer: BC Managed Care – PPO | Admitting: Bariatrics

## 2019-06-09 ENCOUNTER — Other Ambulatory Visit: Payer: Self-pay

## 2019-06-09 VITALS — BP 125/89 | HR 58 | Temp 97.8°F | Ht 68.0 in | Wt 294.0 lb

## 2019-06-09 DIAGNOSIS — F3289 Other specified depressive episodes: Secondary | ICD-10-CM | POA: Diagnosis not present

## 2019-06-09 DIAGNOSIS — R7303 Prediabetes: Secondary | ICD-10-CM

## 2019-06-09 DIAGNOSIS — E559 Vitamin D deficiency, unspecified: Secondary | ICD-10-CM | POA: Diagnosis not present

## 2019-06-09 DIAGNOSIS — Z9189 Other specified personal risk factors, not elsewhere classified: Secondary | ICD-10-CM

## 2019-06-09 DIAGNOSIS — Z6841 Body Mass Index (BMI) 40.0 and over, adult: Secondary | ICD-10-CM

## 2019-06-09 MED ORDER — BUPROPION HCL ER (SR) 150 MG PO TB12: 150.0000 mg | ORAL_TABLET | Freq: Every day | ORAL | 0 refills | Status: DC

## 2019-06-13 NOTE — Progress Notes (Signed)
Office: [phone]  /  Fax: [phone]   HPI:   Chief Complaint: OBESITY Isabella Larson is here to discuss her progress with her obesity treatment plan. She is on the Category 4 plan with an increase of protein to 30 grams and is following her eating plan approximately 75% of the time. She states she is walking 2.5 miles 6-7 times per week. Isabella Larson is down 3 lbs and doing well overall. She is doing well with water and protein intake. She still reports having some hunger in the afternoon.  Her weight is 294 lb (133.4 kg) today and has had a weight loss of 3 pounds over a period of 3 weeks since her last visit. She has lost 11 lbs since starting treatment with us.  Vitamin D deficiency Isabella Larson has a diagnosis of Vitamin D deficiency. She is currently taking Vit D and denies nausea, vomiting or muscle weakness.  Pre-Diabetes Isabella Larson has a diagnosis of prediabetes based on her elevated Hgb A1c and was informed this puts her at greater risk of developing diabetes. Last A1c 5.8 on 04/18/2019 with an insulin of 15.7. We discussed metformin and she declines at this time. She continues to work on diet and exercise to decrease risk of diabetes. She denies nausea or hypoglycemia.  At risk for diabetes Isabella Larson is at higher than averagerisk for developing diabetes due to her obesity. She currently denies polyuria or polydipsia.  Depression with emotional eating behaviors Isabella Larson is struggling with emotional eating and using food for comfort to the extent that it is negatively impacting her health. She often snacks when she is not hungry. Isabella Larson sometimes feels she is out of control and then feels guilty that she made poor food choices. She has been working on behavior modification techniques to help reduce her emotional eating and has been somewhat successful. She shows no sign of suicidal or homicidal ideations.  Depression screen PHQ 2/9 04/18/2019  Decreased Interest 1  Down, Depressed,  Hopeless 2  PHQ - 2 Score 3  Altered sleeping 1  Tired, decreased energy 3  Change in appetite 2  Feeling bad or failure about yourself  2  Trouble concentrating 0  Moving slowly or fidgety/restless 0  Suicidal thoughts 0  PHQ-9 Score 11  Difficult doing work/chores Not difficult at all   ASSESSMENT AND PLAN:  Vitamin D deficiency  Prediabetes  Other depression - With emotional eating  - Plan: buPROPion (WELLBUTRIN SR) 150 MG 12 hr tablet  At risk for diabetes mellitus  Class 3 severe obesity with serious comorbidity and body mass index (BMI) of 40.0 to 44.9 in adult, unspecified obesity type (HCC)  PLAN:  Vitamin D Deficiency Isabella Larson was informed that low Vitamin D levels contributes to fatigue and are associated with obesity, breast, and colon cancer. She agrees to continue taking Vit D and will follow-up for routine testing of Vitamin D, at least 2-3 times per year. She was informed of the risk of over-replacement of Vitamin D and agrees to not increase her dose unless she discusses this with us first. Isabella Larson agrees to follow-up with our clinic in 2 weeks.  Pre-Diabetes Isabella Larson will continue to work on weight loss, exercise, and decreasing simple carbohydrates in her diet to help decrease the risk of diabetes. We dicussed metformin including benefits and risks. She was informed that eating too many simple carbohydrates or too many calories at one sitting increases the likelihood of GI side effects. Isabella Larson was instructed to decrease carbohydrates, increase protein, and continue  exercising. She will follow-up with [place] as directed to monitor her progress.  Diabetes risk counseling Merrillyn was given extended (15 minutes) diabetes prevention counseling today. She is 39 y.o. female and has risk factors for diabetes including obesity. We discussed intensive lifestyle modifications today with an emphasis on weight loss as well as increasing exercise and decreasing simple  carbohydrates in her diet.  Depression with Emotional Eating Behaviors We discussed behavior modification techniques today to help Durenda deal with her emotional eating and depression. Krysia was given a prescription for Wellbutrin 150 mg 1 daily #30 with 0 refills and agrees to follow-up with our clinic in 2 weeks. She will continue seeing Dr. Dewaine Conger, our bariatric psychologist.  Obesity Danielle is currently in the action stage of change. As such, her goal is to continue with weight loss efforts. She has agreed to follow the Category 4 plan with an increase of 30 grams of protein. Natalie will work on meal planning and intentional eating. Vona has been instructed to continue walking for weight loss and overall health benefits. We discussed the following Behavioral Modification Strategies today: increasing lean protein intake, decreasing simple carbohydrates, increasing vegetables, increase H20 intake, decrease eating out, no skipping meals, work on meal planning and easy cooking plans, keeping healthy foods in the home, and planning for success.  Nicoya has agreed to follow-up with our clinic in 2 weeks. She was informed of the importance of frequent follow-up visits to maximize her success with intensive lifestyle modifications for her multiple health conditions.  ALLERGIES: No Known Allergies  MEDICATIONS: Current Outpatient Medications on File Prior to Visit  Medication Sig Dispense Refill   cyanocobalamin (,VITAMIN B-12,) 1000 MCG/ML injection Inject 1,000 mcg into the muscle every 30 (thirty) days.     cyclobenzaprine (FLEXERIL) 10 MG tablet Take 1 tablet (10 mg total) by mouth 3 (three) times daily as needed for muscle spasms. 50 tablet 0   diclofenac (VOLTAREN) 75 MG EC tablet Take 1 tablet (75 mg total) by mouth 2 (two) times daily. 30 tablet 2   diclofenac sodium (VOLTAREN) 1 % GEL Apply 2 g topically 4 (four) times daily. 1 Tube 5   ELDERBERRY PO Take by mouth.       ferrous fumarate (HEMOCYTE - 106 MG FE) 325 (106 FE) MG TABS tablet Take 1 tablet (106 mg of iron total) by mouth daily. Take on an empty stomach with OJ 30 each 5   levothyroxine (SYNTHROID, LEVOTHROID) 150 MCG tablet Take 300 mcg by mouth daily before breakfast.     methylPREDNISolone (MEDROL) 4 MG tablet Take as directed 21 tablet 0   Vitamin D, Ergocalciferol, (DRISDOL) 1.25 MG (50000 UT) CAPS capsule Take 50,000 Units by mouth every 7 (seven) days.     No current facility-administered medications on file prior to visit.     PAST MEDICAL HISTORY: Past Medical History:  Diagnosis Date   Anemia    Back pain    Edema of both feet    Hypothyroidism    Vitamin B 12 deficiency    Vitamin D deficiency     PAST SURGICAL HISTORY: Past Surgical History:  Procedure Laterality Date   mini gastric bypass      SOCIAL HISTORY: Social History   Tobacco Use   Smoking status: Never Smoker   Smokeless tobacco: Never Used  Substance Use Topics   Alcohol use: Not on file   Drug use: Not on file    FAMILY HISTORY: Family History  Problem Relation Age  of Onset   High Cholesterol Mother    Cancer Mother    Sleep apnea Mother    Obesity Mother    Obesity Father    Sleep apnea Father    High Cholesterol Father    ROS: Review of Systems  Gastrointestinal: Negative for nausea and vomiting.  Musculoskeletal:       Negative for muscle weakness.  Endo/Heme/Allergies:       Negative for hypoglycemia.  Psychiatric/Behavioral: Positive for depression (emotional eating). Negative for suicidal ideas.       Negative for homicidal ideas.   PHYSICAL EXAM: Blood pressure 125/89, pulse (!) 58, temperature 97.8 F (36.6 C), temperature source Oral, height 5\' 8"  (1.727 m), weight 294 lb (133.4 kg), last menstrual period 06/06/2019, SpO2 96 %. Body mass index is 44.7 kg/m. Physical Exam Vitals signs reviewed.  Constitutional:      Appearance: Normal appearance. She  is obese.  Cardiovascular:     Rate and Rhythm: Normal rate.     Pulses: Normal pulses.  Pulmonary:     Effort: Pulmonary effort is normal.     Breath sounds: Normal breath sounds.  Musculoskeletal: Normal range of motion.  Skin:    General: Skin is warm and dry.  Neurological:     Mental Status: She is alert and oriented to person, place, and time.  Psychiatric:        Behavior: Behavior normal.   RECENT LABS AND TESTS: BMET    Component Value Date/Time   NA 140 04/18/2019 1220   NA 141 09/23/2013 0932   K 4.7 04/18/2019 1220   K 3.7 09/23/2013 0932   CL 99 04/18/2019 1220   CO2 23 04/18/2019 1220   CO2 23 09/23/2013 0932   GLUCOSE 100 (H) 04/18/2019 1220   GLUCOSE 97 09/23/2013 0932   BUN 10 04/18/2019 1220   BUN 11.6 09/23/2013 0932   CREATININE 0.63 04/18/2019 1220   CREATININE 0.7 09/23/2013 0932   CALCIUM 9.0 04/18/2019 1220   CALCIUM 8.8 09/23/2013 0932   GFRNONAA 116 04/18/2019 1220   GFRAA 133 04/18/2019 1220   Lab Results  Component Value Date   HGBA1C 5.8 (H) 04/18/2019   Lab Results  Component Value Date   INSULIN 15.7 04/18/2019   CBC    Component Value Date/Time   WBC 5.3 09/23/2013 0932   RBC 4.42 09/23/2013 0932   HGB 11.5 (L) 09/23/2013 0932   HCT 36.3 09/23/2013 0932   PLT 214 09/23/2013 0932   MCV 82.1 09/23/2013 0932   MCH 26.0 09/23/2013 0932   MCHC 31.7 09/23/2013 0932   RDW 19.0 (H) 09/23/2013 0932   LYMPHSABS 2.1 09/23/2013 0932   MONOABS 0.4 09/23/2013 0932   EOSABS 0.1 09/23/2013 0932   BASOSABS 0.0 09/23/2013 0932   Iron/TIBC/Ferritin/ %Sat No results found for: IRON, TIBC, FERRITIN, IRONPCTSAT Lipid Panel     Component Value Date/Time   CHOL 183 04/18/2019 1220   TRIG 103 04/18/2019 1220   HDL 60 04/18/2019 1220   LDLCALC 102 (H) 04/18/2019 1220   Hepatic Function Panel     Component Value Date/Time   PROT 6.6 04/18/2019 1220   PROT 6.8 09/23/2013 0932   ALBUMIN 4.0 04/18/2019 1220   ALBUMIN 3.6 09/23/2013 0932    AST 20 04/18/2019 1220   AST 18 09/23/2013 0932   ALT 20 04/18/2019 1220   ALT 20 09/23/2013 0932   ALKPHOS 107 04/18/2019 1220   ALKPHOS 87 09/23/2013 0932   BILITOT 0.2 04/18/2019 1220  BILITOT 0.38 09/23/2013 0932   No results found for: TSH   No results found for: Vitamin D, 25-Hydroxy  OBESITY BEHAVIORAL INTERVENTION VISIT  Today's visit was #4  Starting weight: 305 lbs Starting date: 04/18/2019 Today's weight: 294 lbs  Today's date: 06/09/2019 Total lbs lost to date: 11    06/09/2019  Height 5\' 8"  (1.727 m)  Weight 294 lb (133.4 kg)  BMI (Calculated) 44.71  BLOOD PRESSURE - SYSTOLIC 125  BLOOD PRESSURE - DIASTOLIC 89   Body Fat % 52.4 %  Total Body Water (lbs) 109.2 lbs   ASK: We discussed the diagnosis of obesity with Johnanna Schneiders today and Isabella Amend agreed to give [place] permission to discuss obesity behavioral modification therapy today.  ASSESS: Lauraine has the diagnosis of obesity and her BMI today is 44.7. Markelle is in the action stage of change.   ADVISE: Signe was educated on the multiple health risks of obesity as well as the benefit of weight loss to improve her health. She was advised of the need for long term treatment and the importance of lifestyle modifications to improve her current health and to decrease her risk of future health problems.  AGREE: Multiple dietary modification options and treatment options were discussed and  Denille agreed to follow the recommendations documented in the above note.  ARRANGE: Maddux was educated on the importance of frequent visits to treat obesity as outlined per CMS and USPSTF guidelines and agreed to schedule her next follow up appointment today.  Fernanda Drum, am acting as Energy manager for [employer], DO  I have reviewed the above documentation for accuracy and completeness, and I agree with the above. -Corinna Capra, DO

## 2019-06-14 ENCOUNTER — Encounter (INDEPENDENT_AMBULATORY_CARE_PROVIDER_SITE_OTHER): Payer: Self-pay | Admitting: Bariatrics

## 2019-06-15 ENCOUNTER — Telehealth (INDEPENDENT_AMBULATORY_CARE_PROVIDER_SITE_OTHER): Payer: Self-pay

## 2019-06-15 NOTE — Telephone Encounter (Signed)
Patient called into the office stating since starting on Wellbutrin she has been experiencing small raised bumps on her arm and abdomen that itch. She denies any SOB or chest pain. Patient was instructed per Dr Owens Shark to start Claritin in the morning and benadryl at night until the symptoms resolve. She was also instructed to go to the ED if her symptoms worsen. Patient verbalized understanding. Dr Owens Shark aware.   April, [place]

## 2019-06-15 NOTE — Telephone Encounter (Signed)
Addendum to telephone note dated 9/23. Patient was told to diuscontinue the Wellbutrin and offered an earlier appt but stated she wanted to keep her 10/6 appt at this time.   Isabella Larson, [place]

## 2019-06-22 ENCOUNTER — Other Ambulatory Visit: Payer: Self-pay

## 2019-06-22 ENCOUNTER — Ambulatory Visit (INDEPENDENT_AMBULATORY_CARE_PROVIDER_SITE_OTHER): Payer: BC Managed Care – PPO | Admitting: Psychology

## 2019-06-22 DIAGNOSIS — F3289 Other specified depressive episodes: Secondary | ICD-10-CM | POA: Diagnosis not present

## 2019-06-28 ENCOUNTER — Encounter (INDEPENDENT_AMBULATORY_CARE_PROVIDER_SITE_OTHER): Payer: Self-pay | Admitting: Bariatrics

## 2019-06-28 ENCOUNTER — Ambulatory Visit (INDEPENDENT_AMBULATORY_CARE_PROVIDER_SITE_OTHER): Payer: BC Managed Care – PPO | Admitting: Bariatrics

## 2019-06-28 ENCOUNTER — Other Ambulatory Visit: Payer: Self-pay

## 2019-06-28 VITALS — BP 133/82 | HR 58 | Temp 97.7°F | Ht 68.0 in | Wt 288.0 lb

## 2019-06-28 DIAGNOSIS — Z6841 Body Mass Index (BMI) 40.0 and over, adult: Secondary | ICD-10-CM

## 2019-06-28 DIAGNOSIS — F3289 Other specified depressive episodes: Secondary | ICD-10-CM

## 2019-06-28 DIAGNOSIS — R7303 Prediabetes: Secondary | ICD-10-CM

## 2019-06-28 NOTE — Progress Notes (Signed)
Office: [phone]  /  Fax: [phone]   HPI:   Chief Complaint: OBESITY Isabella AmendCourtney is here to discuss her progress with her obesity treatment plan. She is on the Category 4 plan and greater than 30 grams of protein and is following her eating plan approximately 60% of the time. She states she is walking 2-2.5 miles 5-6 times per week. Isabella AmendCourtney is down 6 lbs and doing well. She is doing fairly well with her water intake. Her weight is 288 lb (130.6 kg) today and has had a weight loss of 6 pounds over a period of 3 weeks since her last visit. She has lost 17 lbs since starting treatment with Isabella Larson.  Depression, Other Isabella AmendCourtney is struggling with emotional eating and using food for comfort to the extent that it is negatively impacting her health. She often snacks when she is not hungry. Isabella AmendCourtney sometimes feels she is out of control and then feels guilty that she made poor food choices. She has been working on behavior modification techniques to help reduce her emotional eating and has been somewhat successful. Isabella AmendCourtney is working with Dr. Dewaine CongerBarker. She had been on Wellbutrin but had hives. She shows no sign of suicidal or homicidal ideations.  Depression screen PHQ 2/9 04/18/2019  Decreased Interest 1  Down, Depressed, Hopeless 2  PHQ - 2 Score 3  Altered sleeping 1  Tired, decreased energy 3  Change in appetite 2  Feeling bad or failure about yourself  2  Trouble concentrating 0  Moving slowly or fidgety/restless 0  Suicidal thoughts 0  PHQ-9 Score 11  Difficult doing work/chores Not difficult at all   Pre-Diabetes Isabella AmendCourtney has a diagnosis of prediabetes based on her elevated Hgb A1c and was informed this puts her at greater risk of developing diabetes. Last A1c 5.8 on 04/18/2019 with an insulin of 15.7. She is not taking metformin currently and continues to work on diet and exercise to decrease risk of diabetes. She denies nausea or hypoglycemia. No polyphagia.  ASSESSMENT AND PLAN:   Prediabetes  Other depression  Class 3 severe obesity with serious comorbidity and body mass index (BMI) of 40.0 to 44.9 in adult, unspecified obesity type (HCC)  PLAN:  Depression, Other We discussed behavior modification techniques today to help Isabella AmendCourtney deal with her emotional eating and depression. Isabella AmendCourtney will stop the Wellbutrin and follow-up as directed.  Pre-Diabetes Isabella AmendCourtney will continue to work on weight loss, exercise, and decreasing simple carbohydrates in her diet to help decrease the risk of diabetes. We dicussed metformin including benefits and risks. She was informed that eating too many simple carbohydrates or too many calories at one sitting increases the likelihood of GI side effects. Isabella AmendCourtney was instructed to decrease carbohydrates and increase protein. She will follow-up with Isabella Larson as directed to monitor her progress.  I spent > than 50% of the 15 minute visit on counseling as documented in the note.  Obesity Isabella AmendCourtney is currently in the action stage of change. As such, her goal is to continue with weight loss efforts. She has agreed to follow the Category 4 plan with greater than 30 grams of protein. Isabella AmendCourtney will work on meal planning, increasing protein, and intentional eating. Recipes and additional breakfast and lunch options were given. Isabella AmendCourtney has been walking more - 2 miles 5-6 times per week for weight loss and overall health benefits. We discussed the following Behavioral Modification Strategies today: increasing lean protein intake, decreasing simple carbohydrates, increasing vegetables, increase H20 intake, decrease eating out, no  skipping meals, work on meal planning and easy cooking plans, keeping healthy foods in the home, and planning for success.  Isabella Larson has agreed to follow-up with our clinic in 2 weeks. She was informed of the importance of frequent follow-up visits to maximize her success with intensive lifestyle modifications for her multiple  health conditions.  ALLERGIES: Allergies  Allergen Reactions  . Wellbutrin [Bupropion]     Rash    MEDICATIONS: Current Outpatient Medications on File Prior to Visit  Medication Sig Dispense Refill  . cyanocobalamin (,VITAMIN B-12,) 1000 MCG/ML injection Inject 1,000 mcg into the muscle every 30 (thirty) days.    . cyclobenzaprine (FLEXERIL) 10 MG tablet Take 1 tablet (10 mg total) by mouth 3 (three) times daily as needed for muscle spasms. 50 tablet 0  . diclofenac (VOLTAREN) 75 MG EC tablet Take 1 tablet (75 mg total) by mouth 2 (two) times daily. 30 tablet 2  . diclofenac sodium (VOLTAREN) 1 % GEL Apply 2 g topically 4 (four) times daily. 1 Tube 5  . ELDERBERRY PO Take by mouth.    . ferrous fumarate (HEMOCYTE - 106 MG FE) 325 (106 FE) MG TABS tablet Take 1 tablet (106 mg of iron total) by mouth daily. Take on an empty stomach with OJ 30 each 5  . levothyroxine (SYNTHROID, LEVOTHROID) 150 MCG tablet Take 300 mcg by mouth daily before breakfast.    . methylPREDNISolone (MEDROL) 4 MG tablet Take as directed 21 tablet 0  . Vitamin D, Ergocalciferol, (DRISDOL) 1.25 MG (50000 UT) CAPS capsule Take 50,000 Units by mouth every 7 (seven) days.     No current facility-administered medications on file prior to visit.     PAST MEDICAL HISTORY: Past Medical History:  Diagnosis Date  . Anemia   . Back pain   . Edema of both feet   . Hypothyroidism   . Vitamin B 12 deficiency   . Vitamin D deficiency     PAST SURGICAL HISTORY: Past Surgical History:  Procedure Laterality Date  . mini gastric bypass      SOCIAL HISTORY: Social History   Tobacco Use  . Smoking status: Never Smoker  . Smokeless tobacco: Never Used  Substance Use Topics  . Alcohol use: Not on file  . Drug use: Not on file    FAMILY HISTORY: Family History  Problem Relation Age of Onset  . High Cholesterol Mother   . Cancer Mother   . Sleep apnea Mother   . Obesity Mother   . Obesity Father   . Sleep  apnea Father   . High Cholesterol Father    ROS: Review of Systems  Gastrointestinal: Negative for nausea.  Endo/Heme/Allergies:       Negative for hypoglycemia. Negative for polyphagia.  Psychiatric/Behavioral: Positive for depression. Negative for suicidal ideas.       Negative for homicidal ideas.   PHYSICAL EXAM: Blood pressure 133/82, pulse (!) 58, temperature 97.7 F (36.5 C), temperature source Oral, height 5\' 8"  (1.727 m), weight 288 lb (130.6 kg), last menstrual period 06/06/2019, SpO2 99 %. Body mass index is 43.79 kg/m. Physical Exam Vitals signs reviewed.  Constitutional:      Appearance: Normal appearance. She is obese.  Cardiovascular:     Rate and Rhythm: Normal rate.     Pulses: Normal pulses.  Pulmonary:     Effort: Pulmonary effort is normal.     Breath sounds: Normal breath sounds.  Musculoskeletal: Normal range of motion.  Skin:    General:  Skin is warm and dry.  Neurological:     Mental Status: She is alert and oriented to person, place, and time.  Psychiatric:        Behavior: Behavior normal.   RECENT LABS AND TESTS: BMET    Component Value Date/Time   NA 140 04/18/2019 1220   NA 141 09/23/2013 0932   K 4.7 04/18/2019 1220   K 3.7 09/23/2013 0932   CL 99 04/18/2019 1220   CO2 23 04/18/2019 1220   CO2 23 09/23/2013 0932   GLUCOSE 100 (H) 04/18/2019 1220   GLUCOSE 97 09/23/2013 0932   BUN 10 04/18/2019 1220   BUN 11.6 09/23/2013 0932   CREATININE 0.63 04/18/2019 1220   CREATININE 0.7 09/23/2013 0932   CALCIUM 9.0 04/18/2019 1220   CALCIUM 8.8 09/23/2013 0932   GFRNONAA 116 04/18/2019 1220   GFRAA 133 04/18/2019 1220   Lab Results  Component Value Date   HGBA1C 5.8 (H) 04/18/2019   Lab Results  Component Value Date   INSULIN 15.7 04/18/2019   CBC    Component Value Date/Time   WBC 5.3 09/23/2013 0932   RBC 4.42 09/23/2013 0932   HGB 11.5 (L) 09/23/2013 0932   HCT 36.3 09/23/2013 0932   PLT 214 09/23/2013 0932   MCV 82.1  09/23/2013 0932   MCH 26.0 09/23/2013 0932   MCHC 31.7 09/23/2013 0932   RDW 19.0 (H) 09/23/2013 0932   LYMPHSABS 2.1 09/23/2013 0932   MONOABS 0.4 09/23/2013 0932   EOSABS 0.1 09/23/2013 0932   BASOSABS 0.0 09/23/2013 0932   Iron/TIBC/Ferritin/ %Sat No results found for: IRON, TIBC, FERRITIN, IRONPCTSAT Lipid Panel     Component Value Date/Time   CHOL 183 04/18/2019 1220   TRIG 103 04/18/2019 1220   HDL 60 04/18/2019 1220   LDLCALC 102 (H) 04/18/2019 1220   Hepatic Function Panel     Component Value Date/Time   PROT 6.6 04/18/2019 1220   PROT 6.8 09/23/2013 0932   ALBUMIN 4.0 04/18/2019 1220   ALBUMIN 3.6 09/23/2013 0932   AST 20 04/18/2019 1220   AST 18 09/23/2013 0932   ALT 20 04/18/2019 1220   ALT 20 09/23/2013 0932   ALKPHOS 107 04/18/2019 1220   ALKPHOS 87 09/23/2013 0932   BILITOT 0.2 04/18/2019 1220   BILITOT 0.38 09/23/2013 0932   No results found for: TSH   No results found for: Vitamin D, 25-Hydroxy  OBESITY BEHAVIORAL INTERVENTION VISIT  Today's visit was #5  Starting weight: 305 lbs Starting date: 04/18/2019 Today's weight: 288 lbs  Today's date: 06/28/2019 Total lbs lost to date: 17    06/28/2019  Height 5\' 8"  (1.727 m)  Weight 288 lb (130.6 kg)  BMI (Calculated) 43.8  BLOOD PRESSURE - SYSTOLIC 133  BLOOD PRESSURE - DIASTOLIC 82   Body Fat % 52.3 %  Total Body Water (lbs) 105.5 lbs   ASK: We discussed the diagnosis of obesity with today and Isabella Larson agreed to give Isabella Larson permission to discuss obesity behavioral modification therapy today.  ASSESS: Isabella Larson has the diagnosis of obesity and her BMI today is 43.8. Marvell is in the action stage of change.   ADVISE: Isabella Larson was educated on the multiple health risks of obesity as well as the benefit of weight loss to improve her health. She was advised of the need for long term treatment and the importance of lifestyle modifications to improve her current health and to decrease  her risk of future health problems.  AGREE:  Multiple dietary modification options and treatment options were discussed and  Isabella Larson agreed to follow the recommendations documented in the above note.  ARRANGE: Isabella Larson was educated on the importance of frequent visits to treat obesity as outlined per CMS and USPSTF guidelines and agreed to schedule her next follow up appointment today.  Isabella Larson, am acting as Energy manager for [employer], DO  I have reviewed the above documentation for accuracy and completeness, and I agree with the above. -Corinna Capra, DO

## 2019-06-29 ENCOUNTER — Encounter (INDEPENDENT_AMBULATORY_CARE_PROVIDER_SITE_OTHER): Payer: Self-pay | Admitting: Bariatrics

## 2019-07-04 NOTE — Progress Notes (Signed)
Office: [phone]  /  Fax: [phone]    Date: July 14, 2019   Appointment Start Time: 2:38pm Duration: 21 minutes Provider: Glennie Isle, Psy.D. Type of Session: Individual Therapy  Location of Patient: Home Location of Provider: [facility] Type of Contact: Telephone Call  Session Content:Of note, this provider called Isabella Larson at 2:32pm as she did not present for the [facility] appointment. A HIPAA compliant voicemail was left requesting a call back. She called the office back and explained to front desk staff she was trying to connect via Webex. This provider called her back and the options to re-schedule today's appointment or continue with a shorter appointment today were provided. Isabella Larson provided verbal consent to proceed with a shorter appointment. Due to technical difficulties, she was agreeable to continuing today's appointment via a regular telephone call. As such, today's appointment was initiated 8 minutes late. Isabella Larson is a 38 y.o. female presenting via a telephone call for a follow-up appointment to address the previously established treatment goal of decreasing emotional eating. Today's appointment was a telepsychological visit, as it is an option for appointments to reduce exposure to COVID-19. Isabella Larson expressed understanding regarding the rationale for telepsychological services, and provided verbal consent for today's appointment. Prior to proceeding with today's appointment, Isabella Larson's physical location at the time of this appointment was obtained. Isabella Larson reported she was at home and provided the address. Isabella Larson and this provider participated in today's telepsychological service. Also, Isabella Larson denied anyone else being present in the room or on the phone call.  This provider conducted a brief check-in. Isabella Larson shared about ongoing work stress, which has impacted her eating habits (e.g., skipping meals) and subsequent weight loss. She added, "I'm  back on track." Isabella Larson further reported a reduction in emotional eating at night. Psychoeducation regarding mindfulness was provided to assist with coping. A handout was provided to Isabella Larson with further information regarding mindfulness, including exercises. This provider also explained the benefit of mindfulness as it relates to emotional eating. Isabella Larson was encouraged to engage in the provided exercises between now and the next appointment with this provider. Isabella Larson agreed. During today's appointment, Isabella Larson was led through a mindfulness exercise involving her senses. Isabella Larson provided verbal consent during today's appointment for this provider to send a handout about mindfulness via e-mail.  Isabella Larson was receptive to today's session as evidenced by openness to sharing, responsiveness to feedback, and willingness to engage in mindfulness exercises.  Mental Status Examination:  Appearance: unable to assess  Behavior: cooperative Mood: euthymic Affect: unable to fully assess Speech: normal in rate, volume, and tone Eye Contact: unable to assess Psychomotor Activity: unable to assess Thought Process: linear, logical, and goal directed  Content/Perceptual Disturbances: no hallucinations, delusions, bizarre thinking or behavior reported or observed and no evidence of suicidal and homicidal ideation, plan, and intent Orientation: time, person, place and purpose of appointment Cognition/Sensorium: memory, attention, language, and fund of knowledge intact  Insight: good Judgment: good  Interventions:  Conducted a brief chart review Provided empathic reflections and validation Psychoeducation provided regarding mindfulness Engaged patient in a mindfulness exercise Employed supportive psychotherapy interventions to facilitate reduced distress, and to improve coping skills with identified stressors Employed acceptance and commitment interventions to emphasize mindfulness and acceptance without  struggle  DSM-5 Diagnosis: 311 (F32.8) Other Specified Depressive Disorder, Emotional Eating Behaviors  Treatment Goal & Progress: During the initial appointment with this provider, the following treatment goal was established: decrease emotional eating. Isabella Larson has demonstrated progress in her goal as evidenced  by increased awareness of hunger patterns and triggers for emotional eating. Isabella Larson also demonstrates willingness to engage in mindfulness exercises.  Plan: Isabella Larson continues to appear able and willing to participate as evidenced by engagement in reciprocal conversation, and asking questions for clarification as appropriate. The next appointment will be scheduled in three weeks, which will be via [employer]. The next session will focus further on mindfulness.

## 2019-07-13 ENCOUNTER — Other Ambulatory Visit: Payer: Self-pay

## 2019-07-13 ENCOUNTER — Ambulatory Visit (INDEPENDENT_AMBULATORY_CARE_PROVIDER_SITE_OTHER): Payer: BC Managed Care – PPO | Admitting: Bariatrics

## 2019-07-13 ENCOUNTER — Encounter (INDEPENDENT_AMBULATORY_CARE_PROVIDER_SITE_OTHER): Payer: Self-pay | Admitting: Bariatrics

## 2019-07-13 VITALS — BP 114/75 | HR 57 | Temp 98.1°F | Ht 68.0 in | Wt 292.0 lb

## 2019-07-13 DIAGNOSIS — Z6841 Body Mass Index (BMI) 40.0 and over, adult: Secondary | ICD-10-CM

## 2019-07-13 DIAGNOSIS — R7303 Prediabetes: Secondary | ICD-10-CM | POA: Diagnosis not present

## 2019-07-13 DIAGNOSIS — Z9884 Bariatric surgery status: Secondary | ICD-10-CM | POA: Diagnosis not present

## 2019-07-14 ENCOUNTER — Encounter (INDEPENDENT_AMBULATORY_CARE_PROVIDER_SITE_OTHER): Payer: Self-pay | Admitting: Bariatrics

## 2019-07-14 ENCOUNTER — Ambulatory Visit (INDEPENDENT_AMBULATORY_CARE_PROVIDER_SITE_OTHER): Payer: BC Managed Care – PPO | Admitting: Psychology

## 2019-07-14 DIAGNOSIS — F3289 Other specified depressive episodes: Secondary | ICD-10-CM | POA: Diagnosis not present

## 2019-07-14 NOTE — Progress Notes (Signed)
Office: [phone]  /  Fax: [phone]   HPI:   Chief Complaint: OBESITY Isabella Larson is here to discuss her progress with her obesity treatment plan. She is on the  follow the Category 4 plan and is following her eating plan approximately 50% of the time. She states she is walking 2 miles 2-3 times per week. Isabella Larson is up 4 lbs but she normally is here in the a.m. and thinks this could make a difference in her weight. She has been disciplined over the last few weeks.  Her weight is 292 lb (132.5 kg) today and has had a weight gain of 4 lbs since her last visit. She has lost 13 lbs since starting treatment with [place].  Pre-Diabetes Isabella Larson has a diagnosis of prediabetes based on her elevated Hgb A1c and was informed this puts her at greater risk of developing diabetes. Last A1c 5.8 on 04/18/2019 with an insulin of 15.7. She is not taking metformin currently and continues to work on diet and exercise to decrease risk of diabetes. She denies nausea or hypoglycemia. No polyphagia.  History of Gastric Bypass Isabella Larson has a history of gastric bypass surgery and reports minimal restriction.  ASSESSMENT AND PLAN:  Prediabetes  History of gastric bypass  Class 3 severe obesity with serious comorbidity and body mass index (BMI) of 40.0 to 44.9 in adult, unspecified obesity type [facility])  PLAN:  Pre-Diabetes Isabella Larson will continue to work on weight loss, exercise, and decreasing simple carbohydrates in her diet to help decrease the risk of diabetes. We dicussed metformin including benefits and risks. She was informed that eating too many simple carbohydrates or too many calories at one sitting increases the likelihood of GI side effects. Ovida was instructed to decrease carbohydrates, increase protein, and increase healthy fats. She will follow-up with [place] as directed to monitor her progress.  History of Gastric Bypass Isabella Larson was instructed to eat small frequent meals.  I spent > than 50%  of the 15 minute visit on counseling as documented in the note.  Obesity Isabella Larson is currently in the action stage of change. As such, her goal is to continue with weight loss efforts. She has agreed to follow the Category 4 plan. Isabella Larson will work on meal planning, intentional eating, will have microwave meals on hand, and will not skip meals. Isabella Larson has been instructed to increase her walking (time and frequency) for weight loss and overall health benefits. We discussed the following Behavioral Modification Strategies today: increasing lean protein intake, decreasing simple carbohydrates, increasing vegetables, increase H20 intake, decrease eating out, no skipping meals, work on meal planning and easy cooking plans, keeping healthy foods in the home, and planning for success.  Isabella Larson has agreed to follow-up with our clinic in 2 weeks. She was informed of the importance of frequent follow-up visits to maximize her success with intensive lifestyle modifications for her multiple health conditions.  ALLERGIES: Allergies  Allergen Reactions  . Wellbutrin [Bupropion]     Rash    MEDICATIONS: Current Outpatient Medications on File Prior to Visit  Medication Sig Dispense Refill  . cyanocobalamin (,VITAMIN B-12,) 1000 MCG/ML injection Inject 1,000 mcg into the muscle every 30 (thirty) days.    . diclofenac sodium (VOLTAREN) 1 % GEL Apply 2 g topically 4 (four) times daily. 1 Tube 5  . ELDERBERRY PO Take by mouth.    . levothyroxine (SYNTHROID, LEVOTHROID) 150 MCG tablet Take 300 mcg by mouth daily before breakfast.    . Vitamin D, Ergocalciferol, (DRISDOL)  1.25 MG (50000 UT) CAPS capsule Take 50,000 Units by mouth every 7 (seven) days.    . ferrous fumarate (HEMOCYTE - 106 MG FE) 325 (106 FE) MG TABS tablet Take 1 tablet (106 mg of iron total) by mouth daily. Take on an empty stomach with OJ (Patient not taking: Reported on 07/13/2019) 30 each 5   No current facility-administered  medications on file prior to visit.     PAST MEDICAL HISTORY: Past Medical History:  Diagnosis Date  . Anemia   . Back pain   . Edema of both feet   . Hypothyroidism   . Vitamin B 12 deficiency   . Vitamin D deficiency     PAST SURGICAL HISTORY: Past Surgical History:  Procedure Laterality Date  . mini gastric bypass      SOCIAL HISTORY: Social History   Tobacco Use  . Smoking status: Never Smoker  . Smokeless tobacco: Never Used  Substance Use Topics  . Alcohol use: Not on file  . Drug use: Not on file    FAMILY HISTORY: Family History  Problem Relation Age of Onset  . High Cholesterol Mother   . Cancer Mother   . Sleep apnea Mother   . Obesity Mother   . Obesity Father   . Sleep apnea Father   . High Cholesterol Father    ROS: Review of Systems  Gastrointestinal: Negative for nausea.  Endo/Heme/Allergies:       Negative for hypoglycemia. Negative for polyphagia.   PHYSICAL EXAM: Blood pressure 114/75, pulse (!) 57, temperature 98.1 F (36.7 C), temperature source Oral, height 5\' 8"  (1.727 m), weight 292 lb (132.5 kg), SpO2 98 %. Body mass index is 44.4 kg/m. Physical Exam Vitals signs reviewed.  Constitutional:      Appearance: Normal appearance. She is obese.  Cardiovascular:     Rate and Rhythm: Normal rate.     Pulses: Normal pulses.  Pulmonary:     Effort: Pulmonary effort is normal.     Breath sounds: Normal breath sounds.  Musculoskeletal: Normal range of motion.  Skin:    General: Skin is warm and dry.  Neurological:     Mental Status: She is alert and oriented to person, place, and time.  Psychiatric:        Behavior: Behavior normal.   RECENT LABS AND TESTS: BMET    Component Value Date/Time   NA 140 04/18/2019 1220   NA 141 09/23/2013 0932   K 4.7 04/18/2019 1220   K 3.7 09/23/2013 0932   CL 99 04/18/2019 1220   CO2 23 04/18/2019 1220   CO2 23 09/23/2013 0932   GLUCOSE 100 (H) 04/18/2019 1220   GLUCOSE 97 09/23/2013 0932    BUN 10 04/18/2019 1220   BUN 11.6 09/23/2013 0932   CREATININE 0.63 04/18/2019 1220   CREATININE 0.7 09/23/2013 0932   CALCIUM 9.0 04/18/2019 1220   CALCIUM 8.8 09/23/2013 0932   GFRNONAA 116 04/18/2019 1220   GFRAA 133 04/18/2019 1220   Lab Results  Component Value Date   HGBA1C 5.8 (H) 04/18/2019   Lab Results  Component Value Date   INSULIN 15.7 04/18/2019   CBC    Component Value Date/Time   WBC 5.3 09/23/2013 0932   RBC 4.42 09/23/2013 0932   HGB 11.5 (L) 09/23/2013 0932   HCT 36.3 09/23/2013 0932   PLT 214 09/23/2013 0932   MCV 82.1 09/23/2013 0932   MCH 26.0 09/23/2013 0932   MCHC 31.7 09/23/2013 0932   RDW  19.0 (H) 09/23/2013 0932   LYMPHSABS 2.1 09/23/2013 0932   MONOABS 0.4 09/23/2013 0932   EOSABS 0.1 09/23/2013 0932   BASOSABS 0.0 09/23/2013 0932   Iron/TIBC/Ferritin/ %Sat No results found for: IRON, TIBC, FERRITIN, IRONPCTSAT Lipid Panel     Component Value Date/Time   CHOL 183 04/18/2019 1220   TRIG 103 04/18/2019 1220   HDL 60 04/18/2019 1220   LDLCALC 102 (H) 04/18/2019 1220   Hepatic Function Panel     Component Value Date/Time   PROT 6.6 04/18/2019 1220   PROT 6.8 09/23/2013 0932   ALBUMIN 4.0 04/18/2019 1220   ALBUMIN 3.6 09/23/2013 0932   AST 20 04/18/2019 1220   AST 18 09/23/2013 0932   ALT 20 04/18/2019 1220   ALT 20 09/23/2013 0932   ALKPHOS 107 04/18/2019 1220   ALKPHOS 87 09/23/2013 0932   BILITOT 0.2 04/18/2019 1220   BILITOT 0.38 09/23/2013 0932   No results found for: TSH   No results found for: Vitamin D, 25-Hydroxy  OBESITY BEHAVIORAL INTERVENTION VISIT  Today's visit was #6  Starting weight: 305 lbs Starting date: 06/28/2019 Today's weight: 292 lbs  Today's date: 07/13/2019 Total lbs lost to date: 13    07/13/2019  Height 5\' 8"  (1.727 m)  Weight 292 lb (132.5 kg)  BMI (Calculated) 44.41  BLOOD PRESSURE - SYSTOLIC 114  BLOOD PRESSURE - DIASTOLIC 75   Body Fat % 53.2 %   ASK: We discussed the diagnosis  of obesity with today and Johnanna Schneiders agreed to give Toni Amend permission to discuss obesity behavioral modification therapy today.  ASSESS: Analissa has the diagnosis of obesity and her BMI today is 44.4. Christiann is in the action stage of change.   ADVISE: Ajanay was educated on the multiple health risks of obesity as well as the benefit of weight loss to improve her health. She was advised of the need for long term treatment and the importance of lifestyle modifications to improve her current health and to decrease her risk of future health problems.  AGREE: Multiple dietary modification options and treatment options were discussed and  Huxley agreed to follow the recommendations documented in the above note.  ARRANGE: Lynniah was educated on the importance of frequent visits to treat obesity as outlined per CMS and USPSTF guidelines and agreed to schedule her next follow up appointment today.  Toni Amend, am acting as Fernanda Drum for Energy manager, DO  I have reviewed the above documentation for accuracy and completeness, and I agree with the above. -[employer], DO

## 2019-07-25 ENCOUNTER — Encounter (INDEPENDENT_AMBULATORY_CARE_PROVIDER_SITE_OTHER): Payer: Self-pay | Admitting: Bariatrics

## 2019-07-25 ENCOUNTER — Other Ambulatory Visit: Payer: Self-pay

## 2019-07-25 ENCOUNTER — Telehealth (INDEPENDENT_AMBULATORY_CARE_PROVIDER_SITE_OTHER): Payer: BC Managed Care – PPO | Admitting: Bariatrics

## 2019-07-25 DIAGNOSIS — R7303 Prediabetes: Secondary | ICD-10-CM | POA: Diagnosis not present

## 2019-07-25 DIAGNOSIS — Z6841 Body Mass Index (BMI) 40.0 and over, adult: Secondary | ICD-10-CM | POA: Diagnosis not present

## 2019-07-25 DIAGNOSIS — E038 Other specified hypothyroidism: Secondary | ICD-10-CM | POA: Diagnosis not present

## 2019-07-26 NOTE — Progress Notes (Signed)
Office: [phone]  /  Fax: [phone] TeleHealth Visit:  Isabella Larson has verbally consented to this TeleHealth visit today. The patient is located at home, the provider is located at the [employer] [facility] office. The participants in this visit include the listed provider and patient. The visit was conducted today via face time.  HPI:   Chief Complaint: OBESITY Isabella Larson is here to discuss her progress with her obesity treatment plan. She is on the Category 4 plan and is following her eating plan approximately 60 % of the time. She states she is exercising 0 minutes 0 times per week. Isabella Larson states that her weight has remained the same. She also states that she is having stomach issues. 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  We were unable to weigh the patient today for this TeleHealth visit. She feels as if she has maintained her weight since her last visit. She has lost 13 lbs since starting treatment with [place].  Hypothyroidism Isabella Larson has a diagnosis of hypothyroidism. She is well controlled on Synthroid. She denies hot or cold intolerance or palpitations.  Pre-Diabetes Isabella Larson has a diagnosis of pre-diabetes based on her elevated Hgb A1c and was informed this puts her at greater risk of developing diabetes. Last A1c was 5.8 and insulin of 15.7. She is not taking metformin currently and continues to work on diet and exercise to decrease risk of diabetes. She denies hypoglycemia.  ASSESSMENT AND PLAN:  Other specified hypothyroidism  Prediabetes  Class 3 severe obesity with serious comorbidity and body mass index (BMI) of 40.0 to 44.9 in adult, unspecified obesity type ([place])  PLAN:  Hypothyroidism Isabella Larson was informed of the importance of good thyroid control to help with weight loss efforts. She was also informed that supertheraputic thyroid levels are dangerous and will not improve weight loss results. Isabella Larson agrees to continue her medications and we discussed  that keeping thyroid levels stable are best for weight loss and maintenance. Isabella Larson agrees to follow up with our clinic in 2 to 3 weeks.  Pre-Diabetes Isabella Larson will continue to work on weight loss, exercise, increase protein, and decreasing simple carbohydrates in her diet to help decrease the risk of diabetes. We dicussed metformin including benefits and risks. She was informed that eating too many simple carbohydrates or too many calories at one sitting increases the likelihood of GI side effects. Isabella Larson agrees to follow up with [place] as directed to monitor her progress.  Obesity Isabella Larson is currently in the action stage of change. As such, her goal is to continue with weight loss efforts She has agreed to follow the Category 4 plan Isabella Larson has been instructed to work up to a goal of 150 minutes of combined cardio and strengthening exercise per week or increase walking for weight loss and overall health benefits. We discussed the following Behavioral Modification Strategies today: increasing lean protein intake, decreasing simple carbohydrates, increasing vegetables, decrease eating out, increase H20 intake, no skipping meals, work on meal planning and easy cooking plans, and keeping healthy foods in the home Isabella Larson will weigh herself at home and will not skip meals.  Isabella Larson has agreed to follow up with our clinic in 2 to 3 weeks. She was informed of the importance of frequent follow up visits to maximize her success with intensive lifestyle modifications for her multiple health conditions.  ALLERGIES: Allergies  Allergen Reactions  . Wellbutrin [Bupropion]     Rash    MEDICATIONS: Current Outpatient Medications on File Prior to  Visit  Medication Sig Dispense Refill  . cyanocobalamin (,VITAMIN B-12,) 1000 MCG/ML injection Inject 1,000 mcg into the muscle every 30 (thirty) days.    . diclofenac sodium (VOLTAREN) 1 % GEL Apply 2 g topically 4 (four) times daily. 1 Tube 5  .  ELDERBERRY PO Take by mouth.    . ferrous fumarate (HEMOCYTE - 106 MG FE) 325 (106 FE) MG TABS tablet Take 1 tablet (106 mg of iron total) by mouth daily. Take on an empty stomach with OJ (Patient not taking: Reported on 07/13/2019) 30 each 5  . levothyroxine (SYNTHROID, LEVOTHROID) 150 MCG tablet Take 300 mcg by mouth daily before breakfast.    . Vitamin D, Ergocalciferol, (DRISDOL) 1.25 MG (50000 UT) CAPS capsule Take 50,000 Units by mouth every 7 (seven) days.     No current facility-administered medications on file prior to visit.     PAST MEDICAL HISTORY: Past Medical History:  Diagnosis Date  . Anemia   . Back pain   . Edema of both feet   . Hypothyroidism   . Vitamin B 12 deficiency   . Vitamin D deficiency     PAST SURGICAL HISTORY: Past Surgical History:  Procedure Laterality Date  . mini gastric bypass      SOCIAL HISTORY: Social History   Tobacco Use  . Smoking status: Never Smoker  . Smokeless tobacco: Never Used  Substance Use Topics  . Alcohol use: Not on file  . Drug use: Not on file    FAMILY HISTORY: Family History  Problem Relation Age of Onset  . High Cholesterol Mother   . Cancer Mother   . Sleep apnea Mother   . Obesity Mother   . Obesity Father   . Sleep apnea Father   . High Cholesterol Father     ROS: Review of Systems  Constitutional: Negative for weight loss.  Cardiovascular: Negative for palpitations.  Endo/Heme/Allergies:       Negative hypoglycemia Negative hot/cold intolerance    PHYSICAL EXAM: Pt in no acute distress  RECENT LABS AND TESTS: BMET    Component Value Date/Time   NA 140 04/18/2019 1220   NA 141 09/23/2013 0932   K 4.7 04/18/2019 1220   K 3.7 09/23/2013 0932   CL 99 04/18/2019 1220   CO2 23 04/18/2019 1220   CO2 23 09/23/2013 0932   GLUCOSE 100 (H) 04/18/2019 1220   GLUCOSE 97 09/23/2013 0932   BUN 10 04/18/2019 1220   BUN 11.6 09/23/2013 0932   CREATININE 0.63 04/18/2019 1220   CREATININE 0.7  09/23/2013 0932   CALCIUM 9.0 04/18/2019 1220   CALCIUM 8.8 09/23/2013 0932   GFRNONAA 116 04/18/2019 1220   GFRAA 133 04/18/2019 1220   Lab Results  Component Value Date   HGBA1C 5.8 (H) 04/18/2019   Lab Results  Component Value Date   INSULIN 15.7 04/18/2019   CBC    Component Value Date/Time   WBC 5.3 09/23/2013 0932   RBC 4.42 09/23/2013 0932   HGB 11.5 (L) 09/23/2013 0932   HCT 36.3 09/23/2013 0932   PLT 214 09/23/2013 0932   MCV 82.1 09/23/2013 0932   MCH 26.0 09/23/2013 0932   MCHC 31.7 09/23/2013 0932   RDW 19.0 (H) 09/23/2013 0932   LYMPHSABS 2.1 09/23/2013 0932   MONOABS 0.4 09/23/2013 0932   EOSABS 0.1 09/23/2013 0932   BASOSABS 0.0 09/23/2013 0932   Iron/TIBC/Ferritin/ %Sat No results found for: IRON, TIBC, FERRITIN, IRONPCTSAT Lipid Panel     Component  Value Date/Time   CHOL 183 04/18/2019 1220   TRIG 103 04/18/2019 1220   HDL 60 04/18/2019 1220   LDLCALC 102 (H) 04/18/2019 1220   Hepatic Function Panel     Component Value Date/Time   PROT 6.6 04/18/2019 1220   PROT 6.8 09/23/2013 0932   ALBUMIN 4.0 04/18/2019 1220   ALBUMIN 3.6 09/23/2013 0932   AST 20 04/18/2019 1220   AST 18 09/23/2013 0932   ALT 20 04/18/2019 1220   ALT 20 09/23/2013 0932   ALKPHOS 107 04/18/2019 1220   ALKPHOS 87 09/23/2013 0932   BILITOT 0.2 04/18/2019 1220   BILITOT 0.38 09/23/2013 0932   No results found for: TSH    I, Burt KnackSharon Martin, am acting as transcriptionist for [employer] Sahiti Joswick, DO  I have reviewed the above documentation for accuracy and completeness, and I agree with the above. Corinna Capra-Lexis Potenza, DO

## 2019-07-27 NOTE — Progress Notes (Signed)
Office: [phone]  /  Fax: [phone]    Date: August 03, 2019   Appointment Start Time: 4:36pm Duration: 25 minutes Provider: Lawerance Cruel, Psy.D. Type of Session: Individual Therapy  Location of Patient: Home Location of Provider: [facility] Type of Contact: Telepsychological Visit via [employer] Web[employer]   Session Content:Of note, this provider called Dazha at 4:32pm as she joined the [employer] appointment on time, but there were audio/visual issues. She attempted to re-join the appointment via her phone using the [employer]. As such, today's appointment was initiated 6 minutes late. Rosy is a 38 y.o. female presenting via [employer] WebEx for a follow-up appointment to address the previously established treatment goal of decreasing emotional eating. Today's appointment was a telepsychological visit, as it is an option for appointments to reduce exposure to COVID-19. Hooria expressed understanding regarding the rationale for telepsychological services, and provided verbal consent for today's appointment. Prior to proceeding with today's appointment, Kaydynce's physical location at the time of this appointment was obtained. In the event of technical difficulties, Yaqueline shared a phone number she could be reached at. Vernadette and this provider participated in today's telepsychological service. Also, Latitia denied anyone else being present in the room or on the WebEx appointment.  This provider conducted a brief check-in. Inda shared she had "a rough period this month," which impacted her overall well-being and eating habits. She shared, "I've just been really struggling staying on the meal plan." Thus, this provider explored her recent eating habits. It was reflected she is choosing food options that are "faster" in the evenings due to fatigue and not consuming enough protein. Arley was then engaged in problem solving to increase protein intake and eating  congruent to the meal plan. She was receptive to moving foods on her meal plan throughout the day to make it easier with her current schedule and to ensure she is eating everything on her plan In addition, psychoeducation regarding the hunger and satisfaction scale was provided. Daryn provided verbal consent during today's appointment for this provider to send the handout for the hunger and satisfaction scale via e-mail. Cassanda was receptive to today's session as evidenced by openness to sharing, responsiveness to feedback, and willingness to utilize the scale to improve eating congruent to her structured meal plan.  Mental Status Examination:  Appearance: neat Behavior: cooperative Mood: euthymic Affect: mood congruent Speech: normal in rate, volume, and tone Eye Contact: appropriate Psychomotor Activity: appropriate Thought Process: linear, logical, and goal directed  Content/Perceptual Disturbances: no hallucinations, delusions, bizarre thinking or behavior reported or observed and no evidence of suicidal and homicidal ideation, plan, and intent Orientation: time, person, place and purpose of appointment Cognition/Sensorium: memory, attention, language, and fund of knowledge intact  Insight: good Judgment: good  Interventions:  Conducted a brief chart review Provided empathic reflections and validation Engaged patient in problem solving Employed motivational interviewing skills to assess patient's willingness/desire to adhere to recommended medical treatments and assignments Psychoeducation provided regarding the hunger and satisfaction scale Employed supportive psychotherapy interventions to facilitate reduced distress, and to improve coping skills with identified stressors  DSM-5 Diagnosis: 311 (F32.8) Other Specified Depressive Disorder, Emotional Eating Behaviors  Treatment Goal & Progress: During the initial appointment with this provider, the following treatment goal was  established: decrease emotional eating. Steffany has demonstrated progress in her goal as evidenced by increased awareness of hunger patterns and triggers for emotional eating. Wanita also continues to demonstrate willingness to engage in learned skill(s).  Plan:  Assunta continues to appear able and willing to participate as evidenced by engagement in reciprocal conversation, and asking questions for clarification as appropriate. Due to the upcoming holiday and this provider being out of the office in the coming weeks, the next appointment will be scheduled in three weeks, which will be via [employer]. The next session will focus further on mindfulness further as it was not discussed today due to Emarie's presenting concerns.

## 2019-08-03 ENCOUNTER — Ambulatory Visit (INDEPENDENT_AMBULATORY_CARE_PROVIDER_SITE_OTHER): Payer: BC Managed Care – PPO | Admitting: Psychology

## 2019-08-03 ENCOUNTER — Other Ambulatory Visit: Payer: Self-pay

## 2019-08-03 DIAGNOSIS — F3289 Other specified depressive episodes: Secondary | ICD-10-CM | POA: Diagnosis not present

## 2019-08-15 NOTE — Progress Notes (Signed)
Office: [phone]  /  Fax: [phone]    Date: August 25, 2019   Appointment Start Time: 4:05pm Duration: 29 minutes Larson: Lawerance Cruel, Psy.D. Type of Session: Individual Therapy  Location of Patient: Home Location of Larson: [facility] Type of Contact: Telepsychological Visit via [employer]   Session Content: Of note, this Larson called Isabella Larson at 4:02pm as she did not present for the [facility] appointment. A HIPAA compliant voicemail was left requesting a call back. She was observed joining shortly and explained she was downloading the [employer] app on her phone. As such, today's appointment was initiated 5 minutes late.  Isabella Larson is a 38 y.o. female presenting via [employer] for a follow-up appointment to address the previously established treatment goal of decreasing emotional eating. Today's appointment was a telepsychological visit, as it is an option for appointments to reduce exposure to COVID-19. Isabella Larson expressed understanding regarding the rationale for telepsychological services, and provided verbal consent for today's appointment. Prior to proceeding with today's appointment, Isabella Larson physical location at the time of this appointment was obtained. In the event of technical difficulties, Isabella Larson shared a phone number she could be reached at. Isabella Larson participated in today's telepsychological service. Also, Isabella Larson denied anyone else being present in the room or on the WebEx appointment.  This Larson conducted a brief check-in. Isabella Larson shared about recent events, including Thanksgiving celebrations. She noted a reduction in emotional eating since the last appointment with this Larson. She further shared her success with Thanksgiving "makes [her] feel more confident for Christmas;" however, expressed concern about the upcoming holidays as there are more "snacky things." As such, psychoeducation regarding making better choices and  engaging in portion control during the holidays was provided. More specifically, this Larson discussed the following strategies: coming to holiday meals hungry, but not starving; avoid filling up on appetizers; managing portion sizes; not completely depriving yourself; making the plate colorful (e.g., vegetables); pacing yourself (e.g., waiting 10 minutes before going back for seconds); taking advantage of the nutritious foods; practicing mindfulness; staying hydrated; and avoid bringing home leftovers or share leftovers. She was receptive to the strategies shared and noted she is feeling "pretty good." Furthermore, termination planning was discussed. Isabella Larson was receptive to scheduling a follow-up appointment in four weeks and an additional follow-up/termination appointment in four weeks after that. Overall, Isabella Larson was receptive to today's session as evidenced by openness to sharing, responsiveness to feedback, and willingness to implement discussed strategies.  Mental Status Examination:  Appearance: neat Behavior: cooperative Mood: euthymic Affect: mood congruent Speech: normal in rate, volume, and tone Eye Contact: appropriate Psychomotor Activity: appropriate Thought Process: linear, logical, and goal directed  Content/Perceptual Disturbances: no hallucinations, delusions, bizarre thinking or behavior reported or observed and no evidence of suicidal and homicidal ideation, plan, and intent Orientation: time, person, place and purpose of appointment Cognition/Sensorium: memory, attention, language, and fund of knowledge intact  Insight: good Judgment: good  Interventions:  Conducted a brief chart review Provided empathic reflections and validation Employed motivational interviewing skills to assess patient's willingness/desire to adhere to recommended medical treatments and assignments Discussed termination planning Employed supportive psychotherapy interventions to facilitate reduced  distress, and to improve coping skills with identified stressors Discussed behavioral strategies for the upcoming holidays  DSM-5 Diagnosis: 311 (F32.8) Other Specified Depressive Disorder, Emotional Eating Behaviors  Treatment Goal & Progress: During the initial appointment with this Larson, the following treatment goal was established: decrease emotional eating. Isabella Larson has demonstrated progress in her  goal as evidenced by increased awareness of hunger patterns and triggers for emotional eating. Isabella Larson also reported a reduction in emotional eating and continues to demonstrate willingness to engage in learned skill(s).  Plan: Isabella Larson continues to appear able and willing to participate as evidenced by engagement in reciprocal conversation, and asking questions for clarification as appropriate. The next appointment will be scheduled in approximately one month, which will be via [employer]. The next session will focus on reviewing learned skills, and working towards the established treatment goal.

## 2019-08-22 ENCOUNTER — Ambulatory Visit (INDEPENDENT_AMBULATORY_CARE_PROVIDER_SITE_OTHER): Payer: BC Managed Care – PPO | Admitting: Bariatrics

## 2019-08-23 ENCOUNTER — Encounter (INDEPENDENT_AMBULATORY_CARE_PROVIDER_SITE_OTHER): Payer: Self-pay | Admitting: Bariatrics

## 2019-08-23 ENCOUNTER — Other Ambulatory Visit: Payer: Self-pay

## 2019-08-23 ENCOUNTER — Ambulatory Visit (INDEPENDENT_AMBULATORY_CARE_PROVIDER_SITE_OTHER): Payer: BC Managed Care – PPO | Admitting: Bariatrics

## 2019-08-23 VITALS — BP 126/81 | HR 62 | Temp 98.7°F | Ht 68.0 in | Wt 285.0 lb

## 2019-08-23 DIAGNOSIS — R7303 Prediabetes: Secondary | ICD-10-CM

## 2019-08-23 DIAGNOSIS — Z9189 Other specified personal risk factors, not elsewhere classified: Secondary | ICD-10-CM

## 2019-08-23 DIAGNOSIS — E538 Deficiency of other specified B group vitamins: Secondary | ICD-10-CM | POA: Diagnosis not present

## 2019-08-23 DIAGNOSIS — Z6841 Body Mass Index (BMI) 40.0 and over, adult: Secondary | ICD-10-CM

## 2019-08-23 DIAGNOSIS — D508 Other iron deficiency anemias: Secondary | ICD-10-CM

## 2019-08-23 NOTE — Progress Notes (Signed)
Office: [phone]  /  Fax: [phone]   HPI:   Chief Complaint: OBESITY Isabella Larson is here to discuss her progress with her obesity treatment plan. She is on the Category 4 plan and is following her eating plan approximately 50% of the time. She states she is exercising 0 minutes 0 times per week. [name] is down 7 lbs since her last visit. She is doing well overall. She states that she is not doing anything different.  Her weight is 285 lb (129.3 kg) today and has had a weight loss of 7 pounds over a period of 6 weeks since her last visit. She has lost 20 lbs since starting treatment with [place].  Vitamin B12 deficiency Isabella Larson has a diagnosis of B12 insufficiency and is taking B12 OTC. She has a history of gastric bypass surgery.   Iron Deficiency Anemia Isabella Larson has a diagnosis of iron deficiency anemia. She reports not taking iron at this time. She has a history of gastric bypass surgery.  Pre-Diabetes Isabella Larson has a diagnosis of prediabetes based on her elevated Hgb A1c and was informed this puts her at greater risk of developing diabetes. Last A1c 5.8 on 04/18/2019 with an insulin of 15.7. She is not taking metformin currently and continues to work on diet and exercise to decrease risk of diabetes. She denies nausea or hypoglycemia. She reports having a normal appetite.  At risk for diabetes Isabella Larson is at higher than average risk for developing diabetes due to her obesity. She currently denies polyuria or polydipsia.  ASSESSMENT AND PLAN:  B12 nutritional deficiency - Plan: Vitamin D (25 hydroxy), B12  Other iron deficiency anemia - Plan: CBC w/Diff/Platelet  Prediabetes - Plan: Comprehensive Metabolic Panel (CMET), HgB A1c, Insulin, random, Lipid Panel With LDL/HDL Ratio  At risk for diabetes mellitus  Class 3 severe obesity with serious comorbidity and body mass index (BMI) of 40.0 to 44.9 in adult, unspecified obesity type ([place])  PLAN:  Vitamin B12 deficiency  Tzippy will continue to take her B12 supplementation. B12 level will be checked and she will follow-up as directed for review of results.  Iron Deficiency Anemia [name] will have CBC checked. She will continue iron rich foods.  Pre-Diabetes Isabella Larson will continue to work on weight loss, exercise, and decreasing simple carbohydrates in her diet to help decrease the risk of diabetes. We dicussed metformin including benefits and risks. She was informed that eating too many simple carbohydrates or too many calories at one sitting increases the likelihood of GI side effects. [name] will decrease carbohydrates, increase protein and healthy fats. She will have Hgb A1c and insulin levels checked.  Diabetes risk counseling Isabella Larson was given extended (15 minutes) diabetes prevention counseling today. She is 38 y.o. female and has risk factors for diabetes including obesity. We discussed intensive lifestyle modifications today with an emphasis on weight loss as well as increasing exercise and decreasing simple carbohydrates in her diet.  Obesity Isabella Larson is currently in the action stage of change. As such, her goal is to continue with weight loss efforts. She has agreed to follow the Category 4 plan. Isabella Larson will work on meal planning, intentional eating, increasing her protein, and will adhere more closely to the plan. Isabella Larson has been instructed to increase activities for weight loss and overall health benefits. She will use the glider, walk, and U-tube. We discussed the following Behavioral Modification Strategies today: increasing lean protein intake, decreasing simple carbohydrates, increasing vegetables, increase H20 intake, better snacking choices, emotional eating strategies, holiday  eating strategies, and celebration eating strategies.  Isabella Larson has agreed to follow-up with our clinic in 2-3 weeks. She was informed of the importance of frequent follow-up visits to maximize her success with  intensive lifestyle modifications for her multiple health conditions.  ALLERGIES: Allergies  Allergen Reactions  . Wellbutrin [Bupropion]     Rash    MEDICATIONS: Current Outpatient Medications on File Prior to Visit  Medication Sig Dispense Refill  . cyanocobalamin (,VITAMIN B-12,) 1000 MCG/ML injection Inject 1,000 mcg into the muscle every 30 (thirty) days.    . diclofenac sodium (VOLTAREN) 1 % GEL Apply 2 g topically 4 (four) times daily. 1 Tube 5  . ELDERBERRY PO Take by mouth.    . ferrous fumarate (HEMOCYTE - 106 MG FE) 325 (106 FE) MG TABS tablet Take 1 tablet (106 mg of iron total) by mouth daily. Take on an empty stomach with OJ 30 each 5  . levothyroxine (SYNTHROID, LEVOTHROID) 150 MCG tablet Take 300 mcg by mouth daily before breakfast.    . Vitamin D, Ergocalciferol, (DRISDOL) 1.25 MG (50000 UT) CAPS capsule Take 50,000 Units by mouth every 7 (seven) days.     No current facility-administered medications on file prior to visit.     PAST MEDICAL HISTORY: Past Medical History:  Diagnosis Date  . Anemia   . Back pain   . Edema of both feet   . Hypothyroidism   . Vitamin B 12 deficiency   . Vitamin D deficiency     PAST SURGICAL HISTORY: Past Surgical History:  Procedure Laterality Date  . mini gastric bypass      SOCIAL HISTORY: Social History   Tobacco Use  . Smoking status: Never Smoker  . Smokeless tobacco: Never Used  Substance Use Topics  . Alcohol use: Not on file  . Drug use: Not on file    FAMILY HISTORY: Family History  Problem Relation Age of Onset  . High Cholesterol Mother   . Cancer Mother   . Sleep apnea Mother   . Obesity Mother   . Obesity Father   . Sleep apnea Father   . High Cholesterol Father    ROS: Review of Systems  Gastrointestinal: Negative for nausea.  Endo/Heme/Allergies:       Negative for hypoglycemia. Positive for iron deficiency anemia.   PHYSICAL EXAM: Blood pressure 126/81, pulse 62, temperature 98.7 F  (37.1 C), height 5\' 8"  (1.727 m), weight 285 lb (129.3 kg), last menstrual period 07/30/2019, SpO2 98 %. Body mass index is 43.33 kg/m. Physical Exam Vitals signs reviewed.  Constitutional:      Appearance: Normal appearance. She is obese.  Cardiovascular:     Rate and Rhythm: Normal rate.     Pulses: Normal pulses.  Pulmonary:     Effort: Pulmonary effort is normal.     Breath sounds: Normal breath sounds.  Musculoskeletal: Normal range of motion.  Skin:    General: Skin is warm and dry.  Neurological:     Mental Status: She is alert and oriented to person, place, and time.  Psychiatric:        Behavior: Behavior normal.   RECENT LABS AND TESTS: BMET    Component Value Date/Time   NA 140 04/18/2019 1220   NA 141 09/23/2013 0932   K 4.7 04/18/2019 1220   K 3.7 09/23/2013 0932   CL 99 04/18/2019 1220   CO2 23 04/18/2019 1220   CO2 23 09/23/2013 0932   GLUCOSE 100 (H) 04/18/2019 1220  GLUCOSE 97 09/23/2013 0932   BUN 10 04/18/2019 1220   BUN 11.6 09/23/2013 0932   CREATININE 0.63 04/18/2019 1220   CREATININE 0.7 09/23/2013 0932   CALCIUM 9.0 04/18/2019 1220   CALCIUM 8.8 09/23/2013 0932   GFRNONAA 116 04/18/2019 1220   GFRAA 133 04/18/2019 1220   Lab Results  Component Value Date   HGBA1C 5.8 (H) 04/18/2019   Lab Results  Component Value Date   INSULIN 15.7 04/18/2019   CBC    Component Value Date/Time   WBC 5.3 09/23/2013 0932   RBC 4.42 09/23/2013 0932   HGB 11.5 (L) 09/23/2013 0932   HCT 36.3 09/23/2013 0932   PLT 214 09/23/2013 0932   MCV 82.1 09/23/2013 0932   MCH 26.0 09/23/2013 0932   MCHC 31.7 09/23/2013 0932   RDW 19.0 (H) 09/23/2013 0932   LYMPHSABS 2.1 09/23/2013 0932   MONOABS 0.4 09/23/2013 0932   EOSABS 0.1 09/23/2013 0932   BASOSABS 0.0 09/23/2013 0932   Iron/TIBC/Ferritin/ %Sat No results found for: IRON, TIBC, FERRITIN, IRONPCTSAT Lipid Panel     Component Value Date/Time   CHOL 183 04/18/2019 1220   TRIG 103 04/18/2019 1220    HDL 60 04/18/2019 1220   LDLCALC 102 (H) 04/18/2019 1220   Hepatic Function Panel     Component Value Date/Time   PROT 6.6 04/18/2019 1220   PROT 6.8 09/23/2013 0932   ALBUMIN 4.0 04/18/2019 1220   ALBUMIN 3.6 09/23/2013 0932   AST 20 04/18/2019 1220   AST 18 09/23/2013 0932   ALT 20 04/18/2019 1220   ALT 20 09/23/2013 0932   ALKPHOS 107 04/18/2019 1220   ALKPHOS 87 09/23/2013 0932   BILITOT 0.2 04/18/2019 1220   BILITOT 0.38 09/23/2013 0932   No results found for: TSH  No results found for: Vitamin D, 25-Hydroxy  OBESITY BEHAVIORAL INTERVENTION VISIT  Today's visit was #8  Starting weight: 305 lbs Starting date: 04/18/2019 Today's weight: 285 lbs  Today's date: 08/23/2019 Total lbs lost to date: 20     08/23/2019  Height 5\' 8"  (1.727 m)  Weight 285 lb (129.3 kg)  BMI (Calculated) 43.34  BLOOD PRESSURE - SYSTOLIC 126  BLOOD PRESSURE - DIASTOLIC 81   Body Fat % 50.7 %  Total Body Water (lbs) 101.6 lbs   ASK: We discussed the diagnosis of obesity with today and Johnanna Schneiders agreed to give Isabella Amend permission to discuss obesity behavioral modification therapy today.  ASSESS: Zoella has the diagnosis of obesity and her BMI today is 43.4. Lizbet is in the action stage of change.   ADVISE: Karianne was educated on the multiple health risks of obesity as well as the benefit of weight loss to improve her health. She was advised of the need for long term treatment and the importance of lifestyle modifications to improve her current health and to decrease her risk of future health problems.  AGREE: Multiple dietary modification options and treatment options were discussed and  Willer agreed to follow the recommendations documented in the above note.  ARRANGE: Love was educated on the importance of frequent visits to treat obesity as outlined per CMS and USPSTF guidelines and agreed to schedule her next follow up appointment today.  Isabella Amend, am  acting as Fernanda Drum for Energy manager, DO   I have reviewed the above documentation for accuracy and completeness, and I agree with the above. -[employer], DO

## 2019-08-24 LAB — CBC WITH DIFFERENTIAL/PLATELET
Basophils Absolute: 0 10*3/uL (ref 0.0–0.2)
Basos: 1 %
EOS (ABSOLUTE): 0.1 10*3/uL (ref 0.0–0.4)
Eos: 2 %
Hematocrit: 37.5 % (ref 34.0–46.6)
Hemoglobin: 12.3 g/dL (ref 11.1–15.9)
Immature Grans (Abs): 0 10*3/uL (ref 0.0–0.1)
Immature Granulocytes: 0 %
Lymphocytes Absolute: 2 10*3/uL (ref 0.7–3.1)
Lymphs: 32 %
MCH: 27.6 pg (ref 26.6–33.0)
MCHC: 32.8 g/dL (ref 31.5–35.7)
MCV: 84 fL (ref 79–97)
Monocytes Absolute: 0.5 10*3/uL (ref 0.1–0.9)
Monocytes: 9 %
Neutrophils Absolute: 3.6 10*3/uL (ref 1.4–7.0)
Neutrophils: 56 %
Platelets: 292 10*3/uL (ref 150–450)
RBC: 4.46 x10E6/uL (ref 3.77–5.28)
RDW: 12.1 % (ref 11.7–15.4)
WBC: 6.2 10*3/uL (ref 3.4–10.8)

## 2019-08-24 LAB — COMPREHENSIVE METABOLIC PANEL
ALT: 19 IU/L (ref 0–32)
AST: 19 IU/L (ref 0–40)
Albumin/Globulin Ratio: 1.6 (ref 1.2–2.2)
Albumin: 4.1 g/dL (ref 3.8–4.8)
Alkaline Phosphatase: 112 IU/L (ref 39–117)
BUN/Creatinine Ratio: 28 — ABNORMAL HIGH (ref 9–23)
BUN: 16 mg/dL (ref 6–20)
Bilirubin Total: 0.2 mg/dL (ref 0.0–1.2)
CO2: 26 mmol/L (ref 20–29)
Calcium: 8.8 mg/dL (ref 8.7–10.2)
Chloride: 104 mmol/L (ref 96–106)
Creatinine, Ser: 0.57 mg/dL (ref 0.57–1.00)
GFR calc Af Amer: 137 mL/min/{1.73_m2} (ref >59)
GFR calc non Af Amer: 119 mL/min/{1.73_m2} (ref >59)
Globulin, Total: 2.6 g/dL (ref 1.5–4.5)
Glucose: 91 mg/dL (ref 65–99)
Potassium: 4.3 mmol/L (ref 3.5–5.2)
Sodium: 140 mmol/L (ref 134–144)
Total Protein: 6.7 g/dL (ref 6.0–8.5)

## 2019-08-24 LAB — HEMOGLOBIN A1C
Est. average glucose Bld gHb Est-mCnc: 131 mg/dL
Hgb A1c MFr Bld: 6.2 % — ABNORMAL HIGH (ref 4.8–5.6)

## 2019-08-24 LAB — SPECIMEN STATUS REPORT

## 2019-08-24 LAB — INSULIN, RANDOM: INSULIN: 15.4 u[IU]/mL (ref 2.6–24.9)

## 2019-08-24 LAB — VITAMIN D 25 HYDROXY (VIT D DEFICIENCY, FRACTURES): Vit D, 25-Hydroxy: 41.2 ng/mL (ref 30.0–100.0)

## 2019-08-24 LAB — LIPID PANEL WITH LDL/HDL RATIO
Cholesterol, Total: 164 mg/dL (ref 100–199)
HDL: 61 mg/dL (ref >39)
LDL Chol Calc (NIH): 90 mg/dL (ref 0–99)
LDL/HDL Ratio: 1.5 ratio (ref 0.0–3.2)
Triglycerides: 65 mg/dL (ref 0–149)
VLDL Cholesterol Cal: 13 mg/dL (ref 5–40)

## 2019-08-24 LAB — VITAMIN B12: Vitamin B-12: 1903 pg/mL — ABNORMAL HIGH (ref 232–1245)

## 2019-08-25 ENCOUNTER — Other Ambulatory Visit: Payer: Self-pay

## 2019-08-25 ENCOUNTER — Ambulatory Visit (INDEPENDENT_AMBULATORY_CARE_PROVIDER_SITE_OTHER): Payer: BC Managed Care – PPO | Admitting: Psychology

## 2019-08-25 DIAGNOSIS — F3289 Other specified depressive episodes: Secondary | ICD-10-CM

## 2019-09-14 ENCOUNTER — Other Ambulatory Visit: Payer: Self-pay

## 2019-09-14 ENCOUNTER — Encounter (INDEPENDENT_AMBULATORY_CARE_PROVIDER_SITE_OTHER): Payer: Self-pay | Admitting: Bariatrics

## 2019-09-14 ENCOUNTER — Ambulatory Visit (INDEPENDENT_AMBULATORY_CARE_PROVIDER_SITE_OTHER): Payer: BC Managed Care – PPO | Admitting: Bariatrics

## 2019-09-14 VITALS — BP 115/80 | HR 57 | Temp 98.0°F | Ht 68.0 in | Wt 284.0 lb

## 2019-09-14 DIAGNOSIS — E559 Vitamin D deficiency, unspecified: Secondary | ICD-10-CM | POA: Diagnosis not present

## 2019-09-14 DIAGNOSIS — R7303 Prediabetes: Secondary | ICD-10-CM | POA: Diagnosis not present

## 2019-09-14 DIAGNOSIS — Z6841 Body Mass Index (BMI) 40.0 and over, adult: Secondary | ICD-10-CM | POA: Diagnosis not present

## 2019-09-14 NOTE — Progress Notes (Signed)
Office: [phone]  /  Fax: [phone]   HPI:  Chief Complaint: OBESITY Isabella Larson is here to discuss her progress with her obesity treatment plan. She is on the Category 4 plan and states she is following her eating plan approximately 50% of the time. She states she is exercising 0 minutes 0 times per week.  Isabella Larson is down 1 lb. She states that "it is the season of snack foods."  Today's visit was #9 Starting weight: 305 lbs Starting date: 04/18/2019 Today's weight: 284 lbs Today's date: 09/14/2019 Total lbs lost to date: 21  Total lbs lost since last in-office visit: 1   Prediabetes Isabella Larson has a diagnosis of prediabetes and reports a normal appetite. Last A1c 6.2 on 08/23/2019 with an insulin of 15.4.   Vitamin D deficiency Isabella Larson has a diagnosis of Vitamin D deficiency and had been taking high dose Vitamin D.   ASSESSMENT AND PLAN:  Prediabetes  Vitamin D deficiency  Class 3 severe obesity with serious comorbidity and body mass index (BMI) of 40.0 to 44.9 in adult, unspecified obesity type (H[facility])  PLAN:  Pre-Diabetes Isabella Larson will continue to work on weight loss, exercise, decreasing carbohydrates, and increasing protein and healthy fats to help decrease the risk of diabetes.   Vitamin D Deficiency Isabella Larson was informed that low Vitamin D levels contributes to fatigue and are associated with obesity, breast, and colon cancer. She agrees to increase OTC Vitamin D to 2,000 IU daily and will follow-up for routine testing of Vitamin D, at least 2-3 times per year. She was informed of the risk of over-replacement of Vitamin D and agrees to not increase her dose unless she discusses this with us first. Isabella Larson agrees to follow-up with our clinic in 2-3 weeks.  TIME SPENT: 22 minutes   Obesity Isabella Larson is currently in the action stage of change. As such, her goal is to continue with weight loss efforts. She has agreed to follow the Category 4 plan. Isabella Larson will  work on meal planning, intentional eating, increasing raw and stewed vegetables in her diet, decreasing snacks, and decreasing portion size. Labs were reviewed including CMP, lipids, and A1c. Isabella Larson has been instructed to use her glider for weight loss and overall health benefits. We discussed the following Behavioral Modification Strategies today: increasing lean protein intake, decreasing simple carbohydrates, increasing vegetables, increase H20 intake, decrease eating out, no skipping meals, work on meal planning and easy cooking plans, keeping healthy foods in the home, ways to avoid boredom eating, ways to avoid nighttime snacking, better snacking choices, emotional eating strategies, and planning for success.  Isabella Larson has agreed to follow-up with our clinic in 2-3 weeks. She was informed of the importance of frequent follow-up visits to maximize her success with intensive lifestyle modifications for her multiple health conditions.  ALLERGIES: Allergies  Allergen Reactions  . Wellbutrin [Bupropion]     Rash    MEDICATIONS: Current Outpatient Medications on File Prior to Visit  Medication Sig Dispense Refill  . cyanocobalamin (,VITAMIN B-12,) 1000 MCG/ML injection Inject 1,000 mcg into the muscle every 30 (thirty) days.    . diclofenac sodium (VOLTAREN) 1 % GEL Apply 2 g topically 4 (four) times daily. 1 Tube 5  . ELDERBERRY PO Take by mouth.    . ferrous fumarate (HEMOCYTE - 106 MG FE) 325 (106 FE) MG TABS tablet Take 1 tablet (106 mg of iron total) by mouth daily. Take on an empty stomach with OJ 30 each 5  . levothyroxine (SYNTHROID, LEVOTHROID)  150 MCG tablet Take 300 mcg by mouth daily before breakfast.    . Vitamin D, Ergocalciferol, (DRISDOL) 1.25 MG (50000 UT) CAPS capsule Take 50,000 Units by mouth every 7 (seven) days.     No current facility-administered medications on file prior to visit.    PAST MEDICAL HISTORY: Past Medical History:  Diagnosis Date  . Anemia   .  Back pain   . Edema of both feet   . Hypothyroidism   . Vitamin B 12 deficiency   . Vitamin D deficiency     PAST SURGICAL HISTORY: Past Surgical History:  Procedure Laterality Date  . mini gastric bypass      SOCIAL HISTORY: Social History   Tobacco Use  . Smoking status: Never Smoker  . Smokeless tobacco: Never Used  Substance Use Topics  . Alcohol use: Not on file  . Drug use: Not on file    FAMILY HISTORY: Family History  Problem Relation Age of Onset  . High Cholesterol Mother   . Cancer Mother   . Sleep apnea Mother   . Obesity Mother   . Obesity Father   . Sleep apnea Father   . High Cholesterol Father    ROS: Review of Systems  Constitutional: Positive for weight loss.   PHYSICAL EXAM: Blood pressure 115/80, pulse (!) 57, temperature 98 F (36.7 C), height 5\' 8"  (1.727 m), weight 284 lb (128.8 kg), last menstrual period 08/30/2019, SpO2 99 %. Body mass index is 43.18 kg/m. Physical Exam Vitals reviewed.  Constitutional:      Appearance: Normal appearance. She is obese.  Cardiovascular:     Rate and Rhythm: Normal rate.     Pulses: Normal pulses.  Pulmonary:     Effort: Pulmonary effort is normal.     Breath sounds: Normal breath sounds.  Musculoskeletal:        General: Normal range of motion.  Skin:    General: Skin is warm and dry.  Neurological:     Mental Status: She is alert and oriented to person, place, and time.  Psychiatric:        Behavior: Behavior normal.   RECENT LABS AND TESTS: BMET    Component Value Date/Time   NA 140 08/23/2019 0000   NA 141 09/23/2013 0932   K 4.3 08/23/2019 0000   K 3.7 09/23/2013 0932   CL 104 08/23/2019 0000   CO2 26 08/23/2019 0000   CO2 23 09/23/2013 0932   GLUCOSE 91 08/23/2019 0000   GLUCOSE 97 09/23/2013 0932   BUN 16 08/23/2019 0000   BUN 11.6 09/23/2013 0932   CREATININE 0.57 08/23/2019 0000   CREATININE 0.7 09/23/2013 0932   CALCIUM 8.8 08/23/2019 0000   CALCIUM 8.8 09/23/2013 0932     GFRNONAA 119 08/23/2019 0000   GFRAA 137 08/23/2019 0000   Lab Results  Component Value Date   HGBA1C 6.2 (H) 08/23/2019   HGBA1C 5.8 (H) 04/18/2019   Lab Results  Component Value Date   INSULIN 15.4 08/23/2019   INSULIN 15.7 04/18/2019   CBC    Component Value Date/Time   WBC 6.2 08/23/2019 0000   WBC 5.3 09/23/2013 0932   RBC 4.46 08/23/2019 0000   RBC 4.42 09/23/2013 0932   HGB 12.3 08/23/2019 0000   HGB 11.5 (L) 09/23/2013 0932   HCT 37.5 08/23/2019 0000   HCT 36.3 09/23/2013 0932   PLT 292 08/23/2019 0000   MCV 84 08/23/2019 0000   MCV 82.1 09/23/2013 0932   MCH  27.6 08/23/2019 0000   MCH 26.0 09/23/2013 0932   MCHC 32.8 08/23/2019 0000   MCHC 31.7 09/23/2013 0932   RDW 12.1 08/23/2019 0000   RDW 19.0 (H) 09/23/2013 0932   LYMPHSABS 2.0 08/23/2019 0000   LYMPHSABS 2.1 09/23/2013 0932   MONOABS 0.4 09/23/2013 0932   EOSABS 0.1 08/23/2019 0000   BASOSABS 0.0 08/23/2019 0000   BASOSABS 0.0 09/23/2013 0932   Iron/TIBC/Ferritin/ %Sat No results found for: IRON, TIBC, FERRITIN, IRONPCTSAT Lipid Panel     Component Value Date/Time   CHOL 164 08/23/2019 0000   TRIG 65 08/23/2019 0000   HDL 61 08/23/2019 0000   LDLCALC 90 08/23/2019 0000   Hepatic Function Panel     Component Value Date/Time   PROT 6.7 08/23/2019 0000   PROT 6.8 09/23/2013 0932   ALBUMIN 4.1 08/23/2019 0000   ALBUMIN 3.6 09/23/2013 0932   AST 19 08/23/2019 0000   AST 18 09/23/2013 0932   ALT 19 08/23/2019 0000   ALT 20 09/23/2013 0932   ALKPHOS 112 08/23/2019 0000   ALKPHOS 87 09/23/2013 0932   BILITOT 0.2 08/23/2019 0000   BILITOT 0.38 09/23/2013 0932   No results found for: TSH  I, Marianna Payment, am acting as Energy manager for [employer], DO   I have reviewed the above documentation for accuracy and completeness, and I agree with the above. Corinna Capra, DO

## 2019-09-26 NOTE — Progress Notes (Signed)
Office: [phone]  /  Fax: [phone]    Date: September 27, 2019   Appointment Start Time: 3:48pm Duration: 32 minutes Provider: Glennie Isle, Psy.D. Type of Session: Individual Therapy  Location of Patient: Home Location of Provider: [facility] Type of Contact: Telepsychological Visit via [employer] Web[employer]  Session Content: Isabella Larson is a 39 y.o. female presenting via [place] for a follow-up appointment to address the previously established treatment goal of decreasing emotional eating. Today's appointment was a telepsychological visit due to COVID-19. Isabella Larson provided verbal consent for today's telepsychological appointment and she is aware she is responsible for securing confidentiality on her end of the session. Prior to proceeding with today's appointment, Isabella Larson's physical location at the time of this appointment was obtained as well a phone number she could be reached at in the event of technical difficulties. Isabella Larson.   This provider conducted a brief check-in. Isabella Larson shared about recent events. She noted, "I've gotten back into my routine for eating and she discussed a goal to start exercising again. She acknowledged snacking since the last appointment with this provider. This further explored and she discussed a history of mindless eating, noting that she would previously consume a large bag of popcorn in one sitting. She noted recently grazing frequently on snack foods. It was reflected she was likely experiencing increased hunger due to a reduction in protein intake. Session then focused on reviewing all or nothing thinking, as Isabella Larson discussed deviations from the meal plan for different meals increasing. She acknowledged she did not realize it in the moment. Session then focused further on mindfulness to assist with coping. Psychoeducation regarding formal (e.g., setting aside a specific  time daily to engage in an exercise) and informal (e.g., cultivating awareness in the present moment and taking a non-judgmental approach while engaging in day-to-day tasks) mindfulness was provided. This provider discussed the utilization of YouTube for mindfulness exercises (e.g., videos by Merri Ray). This provider recommended longer-term therapeutic services and discussed options to establish care with a new provider, including Isabella Larson contacting her insurance company for a list of in-network providers, exploring [email or web address], or this provider placing a referral. Isabella Larson reported a plan to think about it and discuss it further with this provider during the next appointment. Overall, Isabella Larson was receptive to today's appointment as evidenced by openness to sharing, responsiveness to feedback, and willingness to engage in mindfulness exercises to assist with coping.  Mental Status Examination:  Appearance: well groomed and appropriate hygiene  Behavior: appropriate to circumstances Mood: euthymic Affect: mood congruent Speech: normal in rate, volume, and tone Eye Contact: appropriate Psychomotor Activity: appropriate Gait: unable to assess Thought Process: linear, logical, and goal directed  Thought Content/Perception: no hallucinations, delusions, bizarre thinking or behavior reported or observed and no evidence of suicidal and homicidal ideation, plan, and intent Orientation: time, person, place and purpose of appointment Memory/Concentration: memory, attention, language, and fund of knowledge intact  Insight/Judgment: good  Interventions:  Conducted a brief chart review Provided empathic reflections and validation Employed supportive psychotherapy interventions to facilitate reduced distress and to improve coping skills with identified stressors Employed motivational interviewing skills to assess patient's willingness/desire to adhere to recommended medical treatments and  assignments Psychoeducation provided regarding all-or-nothing thinking Psychoeducation provided regarding mindfulness Recommended/discussed option for longer-term therapeutic services  DSM-5 Diagnosis: 311 (F32.8) Other Specified Depressive Disorder, Emotional Eating Behaviors  Treatment Goal & Progress: During the initial appointment with this provider, the following treatment  goal was established: decrease emotional eating. Amelita has demonstrated progress in her goal as evidenced by increased awareness of hunger patterns and increased awareness of triggers for emotional eating. Jocabed also demonstrates willingness to engage in mindfulness exercises.  Plan: The next appointment will be scheduled in two weeks, which will be via [employer]. The next session will focus on working towards the established treatment goal.

## 2019-09-27 ENCOUNTER — Ambulatory Visit (INDEPENDENT_AMBULATORY_CARE_PROVIDER_SITE_OTHER): Payer: BC Managed Care – PPO | Admitting: Psychology

## 2019-09-27 DIAGNOSIS — F3289 Other specified depressive episodes: Secondary | ICD-10-CM

## 2019-09-28 NOTE — Progress Notes (Signed)
Office: [phone]  /  Fax: [phone]    Date: October 12, 2019   Appointment Start Time: 4:06pm Duration: 37 minutes Provider: Lawerance Cruel, Psy.D. Type of Larson: Individual Therapy  Location of Patient: Home Location of Provider: [facility] Type of Contact: Telepsychological Visit via [place] WebEx  Larson Content: This provider called Isabella Larson at 4:02pm as she did not present for the WebEx appointment. A HIPAA compliant voicemail was left requesting a call back. She called back around 4:04pm indicating she was experiencing difficulty connecting. As such, today's appointment was initiated 6 minutes late. Isabella Larson is a 39 y.o. female presenting via [employer] Web[employer] for a follow-up appointment to address the previously established treatment goal of decreasing emotional eating. Today's appointment was a telepsychological visit due to COVID-19. Isabella Larson provided verbal consent for today's telepsychological appointment and she is aware she is responsible for securing confidentiality on her end of the Larson. Prior to proceeding with today's appointment, Isabella Larson's physical location at the time of this appointment was obtained as well a phone number she could be reached at in the event of technical difficulties. Isabella Larson and this provider participated in today's telepsychological service.   This provider conducted a brief check-in and verbally administered the PHQ-9 and GAD-7. Isabella Larson noted she recently gained weight, adding she had a "rough week with motivation." She reported she is back on track and is exercising. Today's Larson focused on motivation by assessing the importance of weight loss on a scale of 1 to 10 where 1 is not at all important and 10 is extremely important. She noted, "Close to a 10" and shared different reasons. This provider also explored her confidence on a scale of 1 to 10 where 1 is she could not follow the meal plan and 10 is confidence that she  could follow the meal plan in the coming weeks. She noted her confidence as "5-6" and explained it is not lower as she has experienced success in following the meal plan in the past when she planned. She reported her number on the scale is not higher as she believes all or nothing thinking can impact her follow-through. Isabella Larson focused on setting a SMART goal to increase eating dinner congruent to the meal plan as that meal has posed the greatest challenges and to also help reduce all or nothing thinking. The following goal was established: Isabella Larson will eat dinner congruent to her meal plan 9 out of 14 days. Regarding continuing therapeutic services, Isabella Larson stated she spoke with her father regarding initiating services with a new therapist for longer-term therapeutic services. She added she also began looking into therapists that incorporate her faith on [employer]. She shared she would inform this provider during the next appointment if a referral would be needed. Overall, Isabella Larson was receptive to today's appointment as evidenced by openness to sharing, responsiveness to feedback, and willingness to work toward the established goal.  Mental Status Examination:  Appearance: well groomed and appropriate hygiene  Behavior: appropriate to circumstances Mood: euthymic Affect: mood congruent Speech: normal in rate, volume, and tone Eye Contact: appropriate Psychomotor Activity: appropriate Gait: unable to assess Thought Process: linear, logical, and goal directed  Thought Content/Perception: no hallucinations, delusions, bizarre thinking or behavior reported or observed and no evidence of suicidal and homicidal ideation, plan, and intent Orientation: time, person, place and purpose of appointment Memory/Concentration: memory, attention, language, and fund of knowledge intact  Insight/Judgment: good  Structured Assessments Results: The Patient Health Questionnaire-9 (PHQ-9) is a  self-report measure that assesses symptoms and severity of depression over the course of the last two weeks. Isabella Larson obtained a score of 7 suggesting mild depression. Isabella Larson finds the endorsed symptoms to be not difficult at all. [0= Not at all; 1= Several days; 2= More than half the days; 3= Nearly every day] Little interest or pleasure in doing things 0  Feeling down, depressed, or hopeless 1  Trouble falling or staying asleep, or sleeping too much 0  Feeling tired or having little energy 2  Poor appetite or overeating 2  Feeling bad about yourself --- or that you are a failure or have let yourself or your family down 2  Trouble concentrating on things, such as reading the newspaper or watching television 0  Moving or speaking so slowly that other people could have noticed? Or the opposite --- being so fidgety or restless that you have been moving around a lot more than usual 0  Thoughts that you would be better off dead or hurting yourself in some way 0  PHQ-9 Score 7    The Generalized Anxiety Disorder-7 (GAD-7) is a brief self-report measure that assesses symptoms of anxiety over the course of the last two weeks. Isabella Larson obtained a score of 0. [0= Not at all; 1= Several days; 2= Over half the days; 3= Nearly every day] Feeling nervous, anxious, on edge 0  Not being able to stop or control worrying 0  Worrying too much about different things 0  Trouble relaxing 0  Being so restless that it's hard to sit still 0  Becoming easily annoyed or irritable 0  Feeling afraid as if something awful might happen 0  GAD-7 Score 0   Interventions:  Conducted a brief chart review Provided empathic reflections and validation Employed supportive psychotherapy interventions to facilitate reduced distress and to improve coping skills with identified stressors Employed motivational interviewing skills to assess patient's willingness/desire to adhere to recommended medical treatments and assignments  Engaged patient in goal setting Psychoeducation provided regarding SMART goals  Verbally administered PHQ-9 and GAD-7  DSM-5 Diagnosis: 311 (F32.8) Other Specified Depressive Disorder, Emotional Eating Behaviors  Treatment Goal & Progress: During the initial appointment with this provider, the following treatment goal was established: decrease emotional eating. Isabella Larson has demonstrated progress in her goal as evidenced by increased awareness of hunger patterns and increased awareness of triggers for emotional eating. Isabella Larson also continues to demonstrate willingness to engage in learned skill(s).  Plan: The next appointment will be scheduled in two weeks, which will be via [employer]. The next Larson will focus on working towards the established treatment goal.

## 2019-10-11 ENCOUNTER — Ambulatory Visit (INDEPENDENT_AMBULATORY_CARE_PROVIDER_SITE_OTHER): Payer: BC Managed Care – PPO | Admitting: Bariatrics

## 2019-10-11 ENCOUNTER — Other Ambulatory Visit: Payer: Self-pay

## 2019-10-11 ENCOUNTER — Encounter (INDEPENDENT_AMBULATORY_CARE_PROVIDER_SITE_OTHER): Payer: Self-pay | Admitting: Bariatrics

## 2019-10-11 VITALS — BP 113/78 | HR 60 | Temp 98.2°F | Ht 68.0 in | Wt 289.0 lb

## 2019-10-11 DIAGNOSIS — F3289 Other specified depressive episodes: Secondary | ICD-10-CM | POA: Diagnosis not present

## 2019-10-11 DIAGNOSIS — Z9884 Bariatric surgery status: Secondary | ICD-10-CM

## 2019-10-11 DIAGNOSIS — Z9189 Other specified personal risk factors, not elsewhere classified: Secondary | ICD-10-CM | POA: Diagnosis not present

## 2019-10-11 DIAGNOSIS — R7303 Prediabetes: Secondary | ICD-10-CM

## 2019-10-11 DIAGNOSIS — Z6841 Body Mass Index (BMI) 40.0 and over, adult: Secondary | ICD-10-CM

## 2019-10-11 DIAGNOSIS — D508 Other iron deficiency anemias: Secondary | ICD-10-CM | POA: Diagnosis not present

## 2019-10-11 NOTE — Progress Notes (Signed)
Chief Complaint:   OBESITY Isabella Larson is here to discuss her progress with her obesity treatment plan along with follow-up of her obesity related diagnoses. Isabella Larson is on the Category 4 Plan and states she is following her eating plan approximately 50% of the time. Isabella Larson states she is doing exercise videos 25-30 minutes 3 times per week.  Today's visit was #: 10 Starting weight: 305 lbs Starting date: 04/18/2019 Today's weight: 289 lbs Today's date: 10/11/2019 Total lbs lost to date: 16  Total lbs lost since last in-office visit: 0  Interim History: Isabella Larson is up 4 lbs and her percentage of body water is also increased per the bioimpedance scale.  She states that she is getting adequate protein.  Subjective:   Other iron deficiency anemia.  Isabella Larson is taking iron. She has iron infusions ~18 months. Most recent CBC normal.  CBC Latest Ref Rng & Units 08/23/2019 09/23/2013 09/13/2013  WBC 3.4 - 10.8 x10E3/uL 6.2 5.3 2.5(L)  Hemoglobin 11.1 - 15.9 g/dL 12.3 11.5(L) 11.0(L)  Hematocrit 34.0 - 46.6 % 37.5 36.3 35.3  Platelets 150 - 450 x10E3/uL 292 214 246   No results found for: IRON, TIBC, FERRITIN Lab Results  Component Value Date   VITAMINB12 1,903 (H) 08/23/2019   Prediabetes. Isabella Larson has a diagnosis of prediabetes based on her elevated HgA1c and was informed this puts her at greater risk of developing diabetes. She continues to work on diet and exercise to decrease her risk of diabetes. She denies nausea or hypoglycemia. No polyphagia.  Lab Results  Component Value Date   HGBA1C 6.2 (H) 08/23/2019   Lab Results  Component Value Date   INSULIN 15.4 08/23/2019   INSULIN 15.7 04/18/2019   Other depression, with emotional eating. Isabella Larson is struggling with emotional eating and using food for comfort to the extent that it is negatively impacting her health. She has been working on behavior modification techniques to help reduce her emotional eating and has been  somewhat successful. She shows no sign of suicidal or homicidal ideations. Isabella Larson has seen Dr. [name] Mussel. She denies contraindications.  History of gastric bypass. Isabella Larson has some restriction. She is taking iron OTC, B12, Vitamin D, Elderberry, Zinc, and Vitamin C.  At risk for deficient intake of food. The patient is at a higher than average risk of deficient intake of food due to gastric bypass surgery and skipping meals.  Assessment/Plan:   Other iron deficiency anemia. Orders and follow-up as documented in patient record.  Counseling . Iron is essential for our bodies to make red blood cells.  Reasons that someone may be deficient include: an iron-deficient diet (more likely in those following vegan or vegetarian diets), women with heavy menses, patients with GI disorders or poor absorption, patients that have had bariatric surgery, frequent blood donors, patients with cancer, and patients with heart disease.   [name] An iron supplement has been recommended.   [name] Noble foods include dark leafy greens, red and white meats, eggs, seafood, and beans.   . Certain foods and drinks prevent your body from absorbing iron properly. Avoid eating these foods in the same meal as iron-rich foods or with iron supplements. These foods include: coffee, black tea, and red wine; milk, dairy products, and foods that are high in calcium; beans and soybeans; whole grains.  . Constipation can be a side effect of iron supplementation. Increased water and fiber intake are helpful. Water goal: > 2 liters/day. Fiber goal: > 25 grams/day.  Prediabetes.  Isabella Larson will continue to work on weight loss, exercise, increasing healthy fats and protein, and decreasing simple carbohydrates to help decrease the risk of diabetes.   Other depression, with emotional eating. Behavior modification techniques were discussed today to help Isabella Larson deal with her emotional/non-hunger eating behaviors.  Orders and follow up as documented in  patient record. Prescription was given for Trokendi 50 mg 1 daily #30 with 0 refills. Dr. Dewaine Conger will refer to another counselor.  History of gastric bypass. Isabella Larson is at risk for malnutrition due to her previous bariatric surgery. She will continue supplements.  Counseling  You may need to eat 3 meals and 2 snacks, or 5 small meals each day in order to reach your protein and calorie goals.   Allow at least 15 minutes for each meal so that you can eat mindfully. Listen to your body so that you do not overeat. For most people, your sleeve or pouch will comfortably hold 4-6 ounces.  Eat foods from all food groups. This includes fruits and vegetables, grains, dairy, and meat and other proteins.  Include a protein-rich food at every meal and snack, and eat the protein food first.   You should be taking a Bariatric Multivitamin as well as calcium.   At risk for deficient intake of food. Isabella Larson was given approximately 15 minutes of deficit intake of food prevention counseling today. Isabella Larson is at risk for eating too few calories based on current food recall. She was encouraged to focus on meeting caloric and protein goals according to her recommended meal plan.   Class 3 severe obesity with serious comorbidity and body mass index (BMI) of 40.0 to 44.9 in adult, unspecified obesity type (HCC).  Isabella Larson is currently in the action stage of change. As such, her goal is to continue with weight loss efforts. She has agreed to the Category 4 Plan.   She will work on meal planning and intentional eating.  Exercise goals: Isabella Larson will continue the exercise videos.  Behavioral modification strategies: increasing lean protein intake, decreasing simple carbohydrates, increasing vegetables, increasing water intake, decreasing eating out, no skipping meals, meal planning and cooking strategies and keeping healthy foods in the home.  Isabella Larson has agreed to follow-up with our clinic in 2 weeks. She was  informed of the importance of frequent follow-up visits to maximize her success with intensive lifestyle modifications for her multiple health conditions.   Objective:   Blood pressure 113/78, pulse 60, temperature 98.2 F (36.8 C), temperature source Oral, height 5\' 8"  (1.727 m), weight 289 lb (131.1 kg), last menstrual period 09/16/2019, SpO2 99 %. Body mass index is 43.94 kg/m.  General: Cooperative, alert, well developed, in no acute distress. HEENT: Conjunctivae and lids unremarkable. Cardiovascular: Regular rhythm.  Lungs: Normal work of breathing. Neurologic: No focal deficits.   Lab Results  Component Value Date   CREATININE 0.57 08/23/2019   BUN 16 08/23/2019   NA 140 08/23/2019   K 4.3 08/23/2019   CL 104 08/23/2019   CO2 26 08/23/2019   Lab Results  Component Value Date   ALT 19 08/23/2019   AST 19 08/23/2019   ALKPHOS 112 08/23/2019   BILITOT 0.2 08/23/2019   Lab Results  Component Value Date   HGBA1C 6.2 (H) 08/23/2019   HGBA1C 5.8 (H) 04/18/2019   Lab Results  Component Value Date   INSULIN 15.4 08/23/2019   INSULIN 15.7 04/18/2019   No results found for: TSH Lab Results  Component Value Date   CHOL 164  08/23/2019   HDL 61 08/23/2019   LDLCALC 90 08/23/2019   TRIG 65 08/23/2019   Lab Results  Component Value Date   WBC 6.2 08/23/2019   HGB 12.3 08/23/2019   HCT 37.5 08/23/2019   MCV 84 08/23/2019   PLT 292 08/23/2019    Attestation Statements:   Reviewed by clinician on day of visit: allergies, medications, problem list, medical history, surgical history, family history, social history, and previous encounter notes.  Time spent on visit including pre-visit chart review and post-visit care was 20 minutes.   Fernanda Drum, am acting as Energy manager for [employer], DO   I have reviewed the above documentation for accuracy and completeness, and I agree with the above. Corinna Capra, DO

## 2019-10-12 ENCOUNTER — Encounter (INDEPENDENT_AMBULATORY_CARE_PROVIDER_SITE_OTHER): Payer: Self-pay | Admitting: Bariatrics

## 2019-10-12 ENCOUNTER — Ambulatory Visit (INDEPENDENT_AMBULATORY_CARE_PROVIDER_SITE_OTHER): Payer: BC Managed Care – PPO | Admitting: Psychology

## 2019-10-12 DIAGNOSIS — F3289 Other specified depressive episodes: Secondary | ICD-10-CM

## 2019-10-13 ENCOUNTER — Other Ambulatory Visit (INDEPENDENT_AMBULATORY_CARE_PROVIDER_SITE_OTHER): Payer: Self-pay | Admitting: Bariatrics

## 2019-10-13 ENCOUNTER — Encounter (INDEPENDENT_AMBULATORY_CARE_PROVIDER_SITE_OTHER): Payer: Self-pay | Admitting: Bariatrics

## 2019-10-13 MED ORDER — TROKENDI XR 50 MG PO CP24: ORAL_CAPSULE | ORAL | 0 refills | Status: DC

## 2019-10-13 NOTE — Progress Notes (Addendum)
  Office: [phone]  /  Fax: [phone]    Date: October 27, 2019   Appointment Start Time: 4:00pm Duration: 29 minutes Provider: Lawerance Cruel, Psy.D. Type of Session: Individual Therapy  Location of Patient: Home Location of Provider: Provider's Home Type of Contact: Telepsychological Visit via [employer]  Session Content: Isabella Larson is a 39 y.o. female presenting via [employer] for a follow-up appointment to address the previously established treatment goal of decreasing emotional eating. Today's appointment was a telepsychological visit due to COVID-19. Isabella Larson provided verbal consent for today's telepsychological appointment and she is aware she is responsible for securing confidentiality on her end of the session. Prior to proceeding with today's appointment, Unique's physical location at the time of this appointment was obtained as well a phone number she could be reached at in the event of technical difficulties. Isabella Larson and this provider participated in today's telepsychological service.   This provider conducted a brief check-in. Isabella Larson shared about recent events, including recent weight loss. She reported she reached her goal for eating dinner congruent to her meal plan (9 out of 14 days). Positive reinforcement was provided. Isabella Larson indicated setting a goal was helpful; therefore, session focused on establishing another goal to increase eating congruent to the meal plan.Isabella Larson reported she continues to experience challenges with snacking after dinner. As such, Isabella Larson established a goal to not over eat her snack calories 3 days out of 7 days between now and the next appointment with this provider. She also noted a goal to eat dinner congruent to her meal plan 4-5 days out of 7 days. Moreover, Isabella Larson reported exploring therapy options and noted a plan to call [facility] to schedule an initial appointment. Overall, Isabella Larson was receptive to today's appointment as  evidenced by openness to sharing, responsiveness to feedback, and willingness to work towards the established goals.  Mental Status Examination:  Appearance: well groomed and appropriate hygiene  Behavior: appropriate to circumstances Mood: euthymic Affect: mood congruent Speech: normal in rate, volume, and tone Eye Contact: appropriate Psychomotor Activity: appropriate Gait: unable to assess Thought Process: linear, logical, and goal directed  Thought Content/Perception: no hallucinations, delusions, bizarre thinking or behavior reported or observed and no evidence of suicidal and homicidal ideation, plan, and intent Orientation: time, person, place and purpose of appointment Memory/Concentration: memory, attention, language, and fund of knowledge intact  Insight/Judgment: good  Interventions:  Conducted a brief chart review Provided empathic reflections and validation Reviewed content from the previous session Provided positive reinforcement Employed supportive psychotherapy interventions to facilitate reduced distress and to improve coping skills with identified stressors Employed motivational interviewing skills to assess patient's willingness/desire to adhere to recommended medical treatments and assignments Engaged patient in goal setting  DSM-5 Diagnosis: 311 (F32.8) Other Specified Depressive Disorder, Emotional Eating Behaviors  Treatment Goal & Progress: During the initial appointment with this provider, the following treatment goal was established: decrease emotional eating. Isabella Larson has demonstrated progress in her goal as evidenced by increased awareness of hunger patterns and increased awareness of triggers for emotional eating. Isabella Larson also continues to demonstrate willingness to engage in learned skill(s).  Plan: The next appointment will be scheduled in three weeks, which will be via [employer]. The next session will focus on working towards the established treatment  goal.

## 2019-10-27 ENCOUNTER — Other Ambulatory Visit: Payer: Self-pay

## 2019-10-27 ENCOUNTER — Ambulatory Visit (INDEPENDENT_AMBULATORY_CARE_PROVIDER_SITE_OTHER): Payer: BC Managed Care – PPO | Admitting: Bariatrics

## 2019-10-27 ENCOUNTER — Ambulatory Visit (INDEPENDENT_AMBULATORY_CARE_PROVIDER_SITE_OTHER): Payer: BC Managed Care – PPO | Admitting: Psychology

## 2019-10-27 ENCOUNTER — Encounter (INDEPENDENT_AMBULATORY_CARE_PROVIDER_SITE_OTHER): Payer: Self-pay | Admitting: Bariatrics

## 2019-10-27 VITALS — BP 102/71 | HR 64 | Temp 97.9°F | Ht 68.0 in | Wt 283.0 lb

## 2019-10-27 DIAGNOSIS — R7303 Prediabetes: Secondary | ICD-10-CM | POA: Diagnosis not present

## 2019-10-27 DIAGNOSIS — F3289 Other specified depressive episodes: Secondary | ICD-10-CM

## 2019-10-27 DIAGNOSIS — Z6841 Body Mass Index (BMI) 40.0 and over, adult: Secondary | ICD-10-CM | POA: Diagnosis not present

## 2019-10-27 NOTE — Progress Notes (Signed)
Chief Complaint:   OBESITY INETHA MARET is here to discuss her progress with her obesity treatment plan along with follow-up of her obesity related diagnoses. Kahleah is on the Category 4 Plan and states she is following her eating plan approximately 75-80% of the time. Stephinie states she is doing cardio 30-40 minutes 1-2 times per week.  Today's visit was #: 11 Starting weight: 305 lbs Starting date: 04/18/2019 Today's weight: 283 lbs Today's date: 10/27/2019 Total lbs lost to date: 22 Total lbs lost since last in-office visit: 6  Interim History: Wetona is down 6 lbs. She started Trokendi at her last visit. She reports drinking more water.  Subjective:   Prediabetes. Reality has a diagnosis of prediabetes based on her elevated HgA1c and was informed this puts her at greater risk of developing diabetes. She continues to work on diet and exercise to decrease her risk of diabetes. She denies nausea or hypoglycemia.   Lab Results  Component Value Date   HGBA1C 6.2 (H) 08/23/2019   Lab Results  Component Value Date   INSULIN 15.4 08/23/2019   INSULIN 15.7 04/18/2019   Other depression, with emotional eating. Yanique is struggling with emotional eating and using food for comfort to the extent that it is negatively impacting her health. She has been working on behavior modification techniques to help reduce her emotional eating and has been somewhat successful. She shows no sign of suicidal or homicidal ideations. Parish started Trokendi at her last visit.  Assessment/Plan:   Prediabetes. Arely will continue to work on weight loss, exercise, increasing protein and healthy fats, and decreasing simple carbohydrates to help decrease the risk of diabetes.   Other depression, with emotional eating. Behavior modification techniques were discussed today to help Kezia deal with her emotional/non-hunger eating behaviors.  Orders and follow up as documented in patient  record. She will continue to meet with Dr. [name] Mussel. She will continue Trokendi and will get an outside counselor  Class 3 severe obesity with serious comorbidity and body mass index (BMI) of 40.0 to 44.9 in adult, unspecified obesity type ([place]).  Bevelyn is currently in the action stage of change. As such, her goal is to continue with weight loss efforts. She has agreed to the Category 4 Plan.   She will work on meal planning and increasing her water intake.  Exercise goals: Tien will add in more steps.  Behavioral modification strategies: increasing lean protein intake, decreasing simple carbohydrates, increasing vegetables, increasing water intake, decreasing eating out, no skipping meals, meal planning and cooking strategies, keeping healthy foods in the home and planning for success.  Bexleigh has agreed to follow-up with our clinic in 2 weeks. She was informed of the importance of frequent follow-up visits to maximize her success with intensive lifestyle modifications for her multiple health conditions.   Objective:   Blood pressure 102/71, pulse 64, temperature 97.9 F (36.6 C), height 5\' 8"  (1.727 m), weight 283 lb (128.4 kg), last menstrual period 10/27/2019, SpO2 99 %. Body mass index is 43.03 kg/m.  General: Cooperative, alert, well developed, in no acute distress. HEENT: Conjunctivae and lids unremarkable. Cardiovascular: Regular rhythm.  Lungs: Normal work of breathing. Neurologic: No focal deficits.   Lab Results  Component Value Date   CREATININE 0.57 08/23/2019   BUN 16 08/23/2019   NA 140 08/23/2019   K 4.3 08/23/2019   CL 104 08/23/2019   CO2 26 08/23/2019   Lab Results  Component Value Date   ALT  19 08/23/2019   AST 19 08/23/2019   ALKPHOS 112 08/23/2019   BILITOT 0.2 08/23/2019   Lab Results  Component Value Date   HGBA1C 6.2 (H) 08/23/2019   HGBA1C 5.8 (H) 04/18/2019   Lab Results  Component Value Date   INSULIN 15.4 08/23/2019   INSULIN 15.7  04/18/2019   No results found for: TSH Lab Results  Component Value Date   CHOL 164 08/23/2019   HDL 61 08/23/2019   LDLCALC 90 08/23/2019   TRIG 65 08/23/2019   Lab Results  Component Value Date   WBC 6.2 08/23/2019   HGB 12.3 08/23/2019   HCT 37.5 08/23/2019   MCV 84 08/23/2019   PLT 292 08/23/2019   No results found for: IRON, TIBC, FERRITIN  Attestation Statements:   Reviewed by clinician on day of visit: allergies, medications, problem list, medical history, surgical history, family history, social history, and previous encounter notes.  Time spent on visit including pre-visit chart review and post-visit care was 20 minutes.   Fernanda Drum, am acting as Energy manager for [employer], DO   I have reviewed the above documentation for accuracy and completeness, and I agree with the above. Corinna Capra, DO
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
## 2019-11-09 ENCOUNTER — Encounter (INDEPENDENT_AMBULATORY_CARE_PROVIDER_SITE_OTHER): Payer: Self-pay

## 2019-11-10 ENCOUNTER — Ambulatory Visit (INDEPENDENT_AMBULATORY_CARE_PROVIDER_SITE_OTHER): Payer: BC Managed Care – PPO | Admitting: Bariatrics

## 2019-11-17 ENCOUNTER — Other Ambulatory Visit: Payer: Self-pay

## 2019-11-17 ENCOUNTER — Ambulatory Visit (INDEPENDENT_AMBULATORY_CARE_PROVIDER_SITE_OTHER): Payer: BC Managed Care – PPO | Admitting: Psychology

## 2019-11-17 DIAGNOSIS — F3289 Other specified depressive episodes: Secondary | ICD-10-CM | POA: Diagnosis not present

## 2019-11-17 DIAGNOSIS — F5089 Other specified eating disorder: Secondary | ICD-10-CM

## 2019-11-22 NOTE — Progress Notes (Signed)
Office: [phone]  /  Fax: [phone]    Date: December 06, 2019   Appointment Start Time: 4:34pm Duration: 49 minutes Provider: Glennie Isle, Psy.D. Type of Session: Individual Therapy  Location of Patient: Parked in car Location of Provider: Provider's Home Type of Contact: Telepsychological Visit via [employer] WebEx  Session Content: This provider called Isabella Larson at 4:32pm as she did not present for the WebEx appointment. A HIPAA compliant voicemail was left requesting a call back. She was observed joining shortly. She disclosed she was driving; therefore, this provider recommended today's appointment be rescheduled if she is unable to pull over in a safe location. She stated she would pull over. As such, today's appointment was initiated 4 minutes late. Isabella Larson is a 39 y.o. female presenting via [place] for a follow-up appointment to address the previously established treatment goal of decreasing emotional eating. Today's appointment was a telepsychological visit due to COVID-19. Isabella Larson provided verbal consent for today's telepsychological appointment and she is aware she is responsible for securing confidentiality on her end of the session. Prior to proceeding with today's appointment, Isabella Larson's physical location at the time of this appointment was obtained as well a phone number she could be reached at in the event of technical difficulties. Isabella Larson and this provider participated in today's telepsychological service. Of note, Isabella Larson was receptive to a longer appointment today due to presenting concerns.   This provider conducted a brief check-in. Isabella Larson shared about recent events. She noted recent weight gain has helped her "crack down," noting she recognized she has fallen back into unhelpful thinking patterns. She added talking to family has assisted with accountability. Isabella Larson stated she is starting to ask herself if she is eating due to physical or emotional hunger. Positive  reinforcement was provided. Psychoeducation regarding common cognitive distortions associated with emotional eating was provided. Isabella Larson provided verbal consent during today's appointment for this provider to send a handout about the distortions via e-mail. Thought defusion was reviewed to assist with coping with cognitive distortions. For the thought defusion exercise during today's appointment, Isabella Larson utilized the following thought: "I am a failure." Her experience was processed after the exercise. Isabella Larson described feeling better as the exercise progressed. Isabella Larson provided verbal consent during today's appointment for this provider to re-send the handout with thought defusion exercises via e-mail. Furthermore, this provider and Isabella Larson discussed her concerns related to her initial appointment with a new provider. Overall, Isabella Larson was receptive to today's appointment as evidenced by openness to sharing, responsiveness to feedback, and willingness to engage in learned skills.  Mental Status Examination:  Appearance: well groomed and appropriate hygiene  Behavior: appropriate to circumstances Mood: euthymic Affect: mood congruent Speech: normal in rate, volume, and tone Eye Contact: appropriate Psychomotor Activity: appropriate Gait: unable to assess Thought Process: linear, logical, and goal directed  Thought Content/Perception: no hallucinations, delusions, bizarre thinking or behavior reported or observed and no evidence of suicidal and homicidal ideation, plan, and intent Orientation: time, person, place and purpose of appointment Memory/Concentration: memory, attention, language, and fund of knowledge intact  Insight/Judgment: good  Interventions:  Conducted a brief chart review Provided empathic reflections and validation Provided positive reinforcement Employed supportive psychotherapy interventions to facilitate reduced distress and to improve coping skills with identified  stressors Employed motivational interviewing skills to assess patient's willingness/desire to adhere to recommended medical treatments and assignments Psychoeducation provided regarding cognitive distortions  Engaged patient in thought defusion exercise(s) Employed acceptance and commitment interventions to emphasize mindfulness and acceptance without struggle  DSM-5 Diagnosis(es): 311 (F32.8) Other Specified Depressive Disorder, Emotional Eating Behaviors  Treatment Goal & Progress: During the initial appointment with this provider, the following treatment goal was established: decrease emotional eating. Isabella Larson has demonstrated progress in her goal as evidenced by increased awareness of hunger patterns and increased awareness of triggers for emotional eating. Isabella Larson also continues to demonstrate willingness to engage in learned skill(s).  Plan: The next appointment will be scheduled in approximately two weeks, which will be via [employer]. The next session will focus on working towards the established treatment goal and termination planning.

## 2019-12-01 ENCOUNTER — Encounter (INDEPENDENT_AMBULATORY_CARE_PROVIDER_SITE_OTHER): Payer: Self-pay | Admitting: Bariatrics

## 2019-12-01 ENCOUNTER — Other Ambulatory Visit: Payer: Self-pay

## 2019-12-01 ENCOUNTER — Ambulatory Visit (INDEPENDENT_AMBULATORY_CARE_PROVIDER_SITE_OTHER): Payer: BC Managed Care – PPO | Admitting: Bariatrics

## 2019-12-01 VITALS — BP 108/71 | HR 64 | Temp 97.9°F | Ht 68.0 in | Wt 286.0 lb

## 2019-12-01 DIAGNOSIS — R7303 Prediabetes: Secondary | ICD-10-CM | POA: Diagnosis not present

## 2019-12-01 DIAGNOSIS — F3289 Other specified depressive episodes: Secondary | ICD-10-CM

## 2019-12-01 DIAGNOSIS — Z6841 Body Mass Index (BMI) 40.0 and over, adult: Secondary | ICD-10-CM

## 2019-12-01 DIAGNOSIS — Z9189 Other specified personal risk factors, not elsewhere classified: Secondary | ICD-10-CM | POA: Diagnosis not present

## 2019-12-01 MED ORDER — TROKENDI XR 50 MG PO CP24: ORAL_CAPSULE | ORAL | 0 refills | Status: DC

## 2019-12-01 NOTE — Progress Notes (Signed)
Chief Complaint:   OBESITY Isabella Larson is here to discuss her progress with her obesity treatment plan along with follow-up of her obesity related diagnoses. Isabella Larson is on the Category 4 Plan and states she is following her eating plan approximately 50% of the time. Isabella Larson states she is exercising 0 minutes 0 times per week.  Today's visit was #: 12 Starting weight: 305 lbs Starting date: 04/18/2019 Today's weight: 286 lbs Today's date: 12/01/2019 Total lbs lost to date: 19 Total lbs lost since last in-office visit: 0  Interim History: Isabella Larson is up 3 lbs. She is taking Topamax (Trokendi XR) for some disordered eating. She is off soft drinks completely.  Subjective:   Other depression, with emotional eating. Isabella Larson is struggling with emotional eating and using food for comfort to the extent that it is negatively impacting her health. She has been working on behavior modification techniques to help reduce her emotional eating and has been somewhat successful. She shows no sign of suicidal or homicidal ideations. Isabella Larson is taking Trokendi. She has seen Dr. Dewaine Conger.  Prediabetes. Isabella Larson has a diagnosis of prediabetes based on her elevated HgA1c and was informed this puts her at greater risk of developing diabetes. She continues to work on diet and exercise to decrease her risk of diabetes. She denies nausea or hypoglycemia. No polyphagia.  Lab Results  Component Value Date   HGBA1C 6.2 (H) 08/23/2019   Lab Results  Component Value Date   INSULIN 15.4 08/23/2019   INSULIN 15.7 04/18/2019   At risk for diabetes mellitus. Isabella Larson is at higher than average risk for developing diabetes due to prediabetes and obesity.   Assessment/Plan:   Other depression, with emotional eating. Behavior modification techniques were discussed today to help Isabella Larson deal with her emotional/non-hunger eating behaviors.  Orders and follow up as documented in patient record. [name] was  given a prescription for Topiramate ER (TROKENDI XR) 50 MG CP24 1 capsule daily #30 with 0 refills. She will follow-up with Dr. Dewaine Conger as directed.  Prediabetes. Isabella Larson will continue to work on weight loss, exercise, increasing healthy fats and protein, and increasing simple carbohydrates to help decrease the risk of diabetes.   At risk for diabetes mellitus. Isabella Larson was given approximately 15 minutes of diabetes education and counseling today. We discussed intensive lifestyle modifications today with an emphasis on weight loss as well as increasing exercise and decreasing simple carbohydrates in her diet. We also reviewed medication options with an emphasis on risk versus benefit of those discussed.   Repetitive spaced learning was employed today to elicit superior memory formation and behavioral change.  Class 3 severe obesity with serious comorbidity and body mass index (BMI) of 40.0 to 44.9 in adult, unspecified obesity type ([facility]).  Isabella Larson is currently in the action stage of change. As such, her goal is to continue with weight loss efforts. She has agreed to the Category 4 Plan.   She will work on meal planning, intentional eating, avoid soft drinks, and increase her water intake. She was given breakfast options.  Exercise goals: All adults should avoid inactivity. Some physical activity is better than none, and adults who participate in any amount of physical activity gain some health benefits.  Behavioral modification strategies: increasing lean protein intake, decreasing simple carbohydrates, increasing vegetables, increasing water intake, decreasing eating out, no skipping meals, meal planning and cooking strategies and keeping healthy foods in the home.  Isabella Larson has agreed to follow-up with our clinic in 2 weeks.  She was informed of the importance of frequent follow-up visits to maximize her success with intensive lifestyle modifications for her multiple health conditions.    Objective:   Blood pressure 108/71, pulse 64, temperature 97.9 F (36.6 C), height 5\' 8"  (1.727 m), weight 286 lb (129.7 kg), last menstrual period 11/23/2019, SpO2 98 %. Body mass index is 43.49 kg/m.  General: Cooperative, alert, well developed, in no acute distress. HEENT: Conjunctivae and lids unremarkable. Cardiovascular: Regular rhythm.  Lungs: Normal work of breathing. Neurologic: No focal deficits.   Lab Results  Component Value Date   CREATININE 0.57 08/23/2019   BUN 16 08/23/2019   NA 140 08/23/2019   K 4.3 08/23/2019   CL 104 08/23/2019   CO2 26 08/23/2019   Lab Results  Component Value Date   ALT 19 08/23/2019   AST 19 08/23/2019   ALKPHOS 112 08/23/2019   BILITOT 0.2 08/23/2019   Lab Results  Component Value Date   HGBA1C 6.2 (H) 08/23/2019   HGBA1C 5.8 (H) 04/18/2019   Lab Results  Component Value Date   INSULIN 15.4 08/23/2019   INSULIN 15.7 04/18/2019   No results found for: TSH Lab Results  Component Value Date   CHOL 164 08/23/2019   HDL 61 08/23/2019   LDLCALC 90 08/23/2019   TRIG 65 08/23/2019   Lab Results  Component Value Date   WBC 6.2 08/23/2019   HGB 12.3 08/23/2019   HCT 37.5 08/23/2019   MCV 84 08/23/2019   PLT 292 08/23/2019   No results found for: IRON, TIBC, FERRITIN  Attestation Statements:   Reviewed by clinician on day of visit: allergies, medications, problem list, medical history, surgical history, family history, social history, and previous encounter notes.  Migdalia Dk, am acting as Location manager for [employer], DO   I have reviewed the above documentation for accuracy and completeness, and I agree with the above. Jearld Lesch, DO

## 2019-12-06 ENCOUNTER — Ambulatory Visit (INDEPENDENT_AMBULATORY_CARE_PROVIDER_SITE_OTHER): Payer: BC Managed Care – PPO | Admitting: Psychology

## 2019-12-06 ENCOUNTER — Other Ambulatory Visit: Payer: Self-pay

## 2019-12-06 DIAGNOSIS — F3289 Other specified depressive episodes: Secondary | ICD-10-CM | POA: Diagnosis not present

## 2019-12-07 NOTE — Progress Notes (Signed)
Office: [phone]  /  Fax: [phone]    Date: December 21, 2019   Appointment Start Time: 2:30pm Duration: 27 minutes Provider: Glennie Isle, Psy.D. Type of Session: Individual Therapy  Location of Patient: Home Location of Provider: Provider's Home Type of Contact: Telepsychological Visit via [employer]  Session Content: Isabella Larson is a 39 y.o. female presenting via [place] for a follow-up appointment to address the previously established treatment goal of decreasing emotional eating. Today's appointment was a telepsychological visit due to COVID-19. Isabella Larson provided verbal consent for today's telepsychological appointment and she is aware she is responsible for securing confidentiality on her end of the session. Prior to proceeding with today's appointment, Isabella Larson's physical location at the time of this appointment was obtained as well a phone number she could be reached at in the event of technical difficulties. Isabella Larson and this provider participated in today's telepsychological service.   This provider conducted a brief check-in and verbally administered the PHQ-9 and GAD-7. Isabella Larson shared about recent events, including losing weight. She further shared she is "doing better with all or nothing thinking." However, Isabella Larson described experiencing anticipatory anxiety about her ability to continue succeeding with her weight loss efforts. This was further explored and Isabella Larson was encouraged to reflect on learned skills to help with coping. She identified the 5-4-3-2-1 exercise; therefore, she was led through it. Barriers that may prevent her from continuing her momentum were explored and she noted the barriers would result in her feeling like a failure. As such, Isabella Larson was encouraged to reflect on other learned skills that can assist with coping. She identified thought challenging and engaging in something productive can assist with coping. In addition, thought defusion was reviewed.  She was engaged in a thought defusion exercise using the happy birthday song and the thought, "I'm a failure." She was observed laughing. Isabella Larson was encouraged to write down learned skills for future reference. She agreed and also noted a plan to start journaling again. Overall, Isabella Larson was receptive to today's appointment as evidenced by openness to sharing, responsiveness to feedback, and willingness to continue engaging in learned skills.  Mental Status Examination:  Appearance: well groomed and appropriate hygiene  Behavior: appropriate to circumstances Mood: euthymic Affect: mood congruent Speech: normal in rate, volume, and tone Eye Contact: appropriate Psychomotor Activity: appropriate Gait: unable to assess Thought Process: linear, logical, and goal directed  Thought Content/Perception: no hallucinations, delusions, bizarre thinking or behavior reported or observed and no evidence of suicidal and homicidal ideation, plan, and intent Orientation: time, person, place and purpose of appointment Memory/Concentration: memory, attention, language, and fund of knowledge intact  Insight/Judgment: good  Structured Assessments Results: The Patient Health Questionnaire-9 (PHQ-9) is a self-report measure that assesses symptoms and severity of depression over the course of the last two weeks. Isabella Larson obtained a score of 0. [0= Not at all; 1= Several days; 2= More than half the days; 3= Nearly every day] Little interest or pleasure in doing things 0  Feeling down, depressed, or hopeless 0  Trouble falling or staying asleep, or sleeping too much 0  Feeling tired or having little energy 0  Poor appetite or overeating 0  Feeling bad about yourself --- or that you are a failure or have let yourself or your family down 0  Trouble concentrating on things, such as reading the newspaper or watching television 0  Moving or speaking so slowly that other people could have noticed? Or the opposite ---  being so fidgety or restless that  you have been moving around a lot more than usual 0  Thoughts that you would be better off dead or hurting yourself in some way 0  PHQ-9 Score 0    The Generalized Anxiety Disorder-7 (GAD-7) is a brief self-report measure that assesses symptoms of anxiety over the course of the last two weeks. Isabella Larson obtained a score of 0. [0= Not at all; 1= Several days; 2= Over half the days; 3= Nearly every day] Feeling nervous, anxious, on edge 0  Not being able to stop or control worrying 0  Worrying too much about different things 0  Trouble relaxing 0  Being so restless that it's hard to sit still 0  Becoming easily annoyed or irritable 0  Feeling afraid as if something awful might happen 0  GAD-7 Score 0   Interventions:  Conducted a brief chart review Verbally administered PHQ-9 and GAD-7 for symptom monitoring Provided empathic reflections and validation Employed supportive psychotherapy interventions to facilitate reduced distress and to improve coping skills with identified stressors Employed motivational interviewing skills to assess patient's willingness/desire to adhere to recommended medical treatments and assignments Reviewed learned skills  DSM-5 Diagnosis(es): 311 (F32.8) Other Specified Depressive Disorder, Emotional Eating Behaviors  Treatment Goal & Progress: During the initial appointment with this provider, the following treatment goal was established: decrease emotional eating. Isabella Larson has demonstrated progress in her goal as evidenced by increased awareness of hunger patterns, increased awareness of triggers for emotional eating and reduction in emotional eating. Kortni also continues to demonstrate willingness to engage in learned skill(s).  Plan: Chika will be initiating therapeutic services with [facility] on January 03, 2020. She acknowledged understanding that she may request a follow-up appointment with this provider  in the future as long as she is still established with the clinic. No further follow-up planned by this provider.

## 2019-12-20 ENCOUNTER — Encounter (INDEPENDENT_AMBULATORY_CARE_PROVIDER_SITE_OTHER): Payer: Self-pay | Admitting: Bariatrics

## 2019-12-20 ENCOUNTER — Other Ambulatory Visit: Payer: Self-pay

## 2019-12-20 ENCOUNTER — Ambulatory Visit (INDEPENDENT_AMBULATORY_CARE_PROVIDER_SITE_OTHER): Payer: BC Managed Care – PPO | Admitting: Bariatrics

## 2019-12-20 VITALS — BP 111/73 | HR 62 | Temp 97.8°F | Ht 68.0 in | Wt 282.0 lb

## 2019-12-20 DIAGNOSIS — F3289 Other specified depressive episodes: Secondary | ICD-10-CM | POA: Diagnosis not present

## 2019-12-20 DIAGNOSIS — Z9884 Bariatric surgery status: Secondary | ICD-10-CM | POA: Diagnosis not present

## 2019-12-20 DIAGNOSIS — R7303 Prediabetes: Secondary | ICD-10-CM

## 2019-12-20 DIAGNOSIS — Z6841 Body Mass Index (BMI) 40.0 and over, adult: Secondary | ICD-10-CM

## 2019-12-20 DIAGNOSIS — Z9189 Other specified personal risk factors, not elsewhere classified: Secondary | ICD-10-CM | POA: Diagnosis not present

## 2019-12-20 NOTE — Progress Notes (Signed)
Chief Complaint:   Isabella Larson is here to discuss her progress with her Isabella treatment plan along with follow-up of her Isabella related diagnoses. Isabella Larson is on the Category 4 Plan and states she is following her eating plan approximately 80% of the time. Isabella Larson states she is exercising 0 minutes 0 times per week.  Today's visit was #: 13 Starting weight: 305 lbs Starting date: 04/18/2019 Today's weight: 282 lbs Today's date: 12/20/2019 Total lbs lost to date: 23 Total lbs lost since last in-office visit: 4  Interim History: Isabella Larson is down 4 lbs and doing well. She reports doing well with her water intake.  Subjective:   Prediabetes. Isabella Larson has a diagnosis of prediabetes based on her elevated HgA1c and was informed this puts her at greater risk of developing diabetes. She continues to work on diet and exercise to decrease her risk of diabetes. She denies nausea or hypoglycemia. Isabella Larson is on no medications.  Lab Results  Component Value Date   HGBA1C 6.2 (H) 08/23/2019   Lab Results  Component Value Date   INSULIN 15.4 08/23/2019   INSULIN 15.7 04/18/2019   History of gastric bypass. No abdominal pain and still has some mild restriction.   Other depression with emotional eating. Deziya is struggling with emotional eating and using food for comfort to the extent that it is negatively impacting her health. She has been working on behavior modification techniques to help reduce her emotional eating and has been somewhat successful. She shows no sign of suicidal or homicidal ideations. Isabella Larson is taking Trokendi. She reports being off soft drinks.  At risk for hypoglycemia. Isabella Larson is at increased risk for hypoglycemia due to prediabetes and dietary changes.   Assessment/Plan:   Prediabetes. Isabella Larson will continue to work on weight loss, exercise, increasing healthy fats and protein, and decreasing simple carbohydrates to help decrease the risk of  diabetes. Comprehensive metabolic panel, Hemoglobin A1c, Insulin, random labs ordered.  History of gastric bypass. Isabella Larson will eat small, frequent meals.  Other depression with emotional eating. Behavior modification techniques were discussed today to help Isabella Larson deal with her emotional/non-hunger eating behaviors.  Orders and follow up as documented in patient record. Isabella Larson will take Trokendi as directed.  At risk for hypoglycemia. Isabella Larson was given approximately 15 minutes of counseling today regarding prevention of hypoglycemia. She was advised of symptoms of hypoglycemia. Isabella Larson was instructed to avoid skipping meals, eat regular protein rich meals and schedule low calorie snacks as needed.   Repetitive spaced learning was employed today to elicit superior memory formation and behavioral change.  Class 3 severe Isabella with serious comorbidity and body mass index (BMI) of 40.0 to 44.9 in adult, unspecified Isabella type (HCC).  Isabella Larson is currently in the action stage of change. As such, her goal is to continue with weight loss efforts. She has agreed to the Category 4 Plan.   She will work on meal planning, mindful eating, and will continue to drink adequate water.  Exercise goals: Isabella Larson will increase walking and will do videos if it rains.  Behavioral modification strategies: increasing lean protein intake, decreasing simple carbohydrates, increasing vegetables, increasing water intake, decreasing eating out, no skipping meals, meal planning and cooking strategies, keeping healthy foods in the home and planning for success.  Isabella Larson has agreed to follow-up with our clinic in 2 weeks. She was informed of the importance of frequent follow-up visits to maximize her success with intensive lifestyle modifications for her multiple health conditions.  Isabella Larson was informed we would discuss her lab results at her next visit unless there is a critical issue that needs to be  addressed sooner. Isabella Larson agreed to keep her next visit at the agreed upon time to discuss these results.  Objective:   Blood pressure 111/73, pulse 62, temperature 97.8 F (36.6 C), height 5\' 8"  (1.727 m), weight 282 lb (127.9 kg), last menstrual period 11/23/2019, SpO2 96 %. Body mass index is 42.88 kg/m.  General: Cooperative, alert, well developed, in no acute distress. HEENT: Conjunctivae and lids unremarkable. Cardiovascular: Regular rhythm.  Lungs: Normal work of breathing. Neurologic: No focal deficits.   Lab Results  Component Value Date   CREATININE 0.57 08/23/2019   BUN 16 08/23/2019   NA 140 08/23/2019   K 4.3 08/23/2019   CL 104 08/23/2019   CO2 26 08/23/2019   Lab Results  Component Value Date   ALT 19 08/23/2019   AST 19 08/23/2019   ALKPHOS 112 08/23/2019   BILITOT 0.2 08/23/2019   Lab Results  Component Value Date   HGBA1C 6.2 (H) 08/23/2019   HGBA1C 5.8 (H) 04/18/2019   Lab Results  Component Value Date   INSULIN 15.4 08/23/2019   INSULIN 15.7 04/18/2019   No results found for: TSH Lab Results  Component Value Date   CHOL 164 08/23/2019   HDL 61 08/23/2019   LDLCALC 90 08/23/2019   TRIG 65 08/23/2019   Lab Results  Component Value Date   WBC 6.2 08/23/2019   HGB 12.3 08/23/2019   HCT 37.5 08/23/2019   MCV 84 08/23/2019   PLT 292 08/23/2019   No results found for: IRON, TIBC, FERRITIN  Attestation Statements:   Reviewed by clinician on day of visit: allergies, medications, problem list, medical history, surgical history, family history, social history, and previous encounter notes.  Migdalia Dk, am acting as Location manager for [employer], DO   I have reviewed the above documentation for accuracy and completeness, and I agree with the above. Jearld Lesch, DO

## 2019-12-21 ENCOUNTER — Ambulatory Visit (INDEPENDENT_AMBULATORY_CARE_PROVIDER_SITE_OTHER): Payer: BC Managed Care – PPO | Admitting: Psychology

## 2019-12-21 DIAGNOSIS — F3289 Other specified depressive episodes: Secondary | ICD-10-CM | POA: Diagnosis not present

## 2019-12-21 LAB — HEMOGLOBIN A1C
Est. average glucose Bld gHb Est-mCnc: 126 mg/dL
Hgb A1c MFr Bld: 6 % — ABNORMAL HIGH (ref 4.8–5.6)

## 2019-12-21 LAB — COMPREHENSIVE METABOLIC PANEL
ALT: 15 IU/L (ref 0–32)
AST: 13 IU/L (ref 0–40)
Albumin/Globulin Ratio: 1.8 (ref 1.2–2.2)
Albumin: 4 g/dL (ref 3.8–4.8)
Alkaline Phosphatase: 107 IU/L (ref 39–117)
BUN/Creatinine Ratio: 22 (ref 9–23)
BUN: 13 mg/dL (ref 6–20)
Bilirubin Total: <0.2 mg/dL (ref 0.0–1.2)
CO2: 23 mmol/L (ref 20–29)
Calcium: 8.6 mg/dL — ABNORMAL LOW (ref 8.7–10.2)
Chloride: 109 mmol/L — ABNORMAL HIGH (ref 96–106)
Creatinine, Ser: 0.6 mg/dL (ref 0.57–1.00)
GFR calc Af Amer: 135 mL/min/{1.73_m2} (ref >59)
GFR calc non Af Amer: 117 mL/min/{1.73_m2} (ref >59)
Globulin, Total: 2.2 g/dL (ref 1.5–4.5)
Glucose: 101 mg/dL — ABNORMAL HIGH (ref 65–99)
Potassium: 4.1 mmol/L (ref 3.5–5.2)
Sodium: 144 mmol/L (ref 134–144)
Total Protein: 6.2 g/dL (ref 6.0–8.5)

## 2019-12-21 LAB — INSULIN, RANDOM: INSULIN: 9.8 u[IU]/mL (ref 2.6–24.9)

## 2020-01-03 ENCOUNTER — Ambulatory Visit (INDEPENDENT_AMBULATORY_CARE_PROVIDER_SITE_OTHER): Payer: BC Managed Care – PPO | Admitting: Psychology

## 2020-01-03 DIAGNOSIS — F5081 Binge eating disorder: Secondary | ICD-10-CM

## 2020-01-05 ENCOUNTER — Ambulatory Visit (INDEPENDENT_AMBULATORY_CARE_PROVIDER_SITE_OTHER): Payer: BC Managed Care – PPO | Admitting: Bariatrics

## 2020-01-19 ENCOUNTER — Other Ambulatory Visit: Payer: Self-pay

## 2020-01-19 ENCOUNTER — Ambulatory Visit (INDEPENDENT_AMBULATORY_CARE_PROVIDER_SITE_OTHER): Payer: BC Managed Care – PPO | Admitting: Family Medicine

## 2020-01-19 ENCOUNTER — Encounter (INDEPENDENT_AMBULATORY_CARE_PROVIDER_SITE_OTHER): Payer: Self-pay | Admitting: Family Medicine

## 2020-01-19 VITALS — BP 111/76 | HR 72 | Temp 98.0°F | Ht 68.0 in | Wt 283.0 lb

## 2020-01-19 DIAGNOSIS — F3289 Other specified depressive episodes: Secondary | ICD-10-CM | POA: Diagnosis not present

## 2020-01-19 DIAGNOSIS — Z6841 Body Mass Index (BMI) 40.0 and over, adult: Secondary | ICD-10-CM

## 2020-01-19 MED ORDER — TROKENDI XR 50 MG PO CP24: ORAL_CAPSULE | ORAL | 0 refills | Status: DC

## 2020-01-23 NOTE — Progress Notes (Signed)
Chief Complaint:   OBESITY Keyaria is here to discuss her progress with her obesity treatment plan along with follow-up of her obesity related diagnoses. Daila is on the Category 4 Plan and states she is following her eating plan approximately 70% of the time. Jaella states she is doing 0 minutes 0 times per week.  Today's visit was #: 14 Starting weight: 305 lbs Starting date: 04/18/2019 Today's weight: 283 lbs Today's date: 01/19/2020 Total lbs lost to date: 22 Total lbs lost since last in-office visit: 0  Interim History: Birtie has struggled more with her Category 4 plan. She notes struggling to eat all of her dinner meal and tends to want to snack instead.  Subjective:   1. Other depression, with emotional eating  Norita is stable on topiramate and notes mild dysgeusia. She is not on BCP, but she is not in a sexual relationship.  Assessment/Plan:   1. Other depression, with emotional eating  Behavior modification techniques were discussed today to help Ludene deal with her emotional/non-hunger eating behaviors. We will refill topiramate for 1 month. Orders and follow up as documented in patient record.   - Topiramate ER (TROKENDI XR) 50 MG CP24; 1 capsule daily  Dispense: 30 capsule; Refill: 0  2. Class 3 severe obesity with serious comorbidity and body mass index (BMI) of 40.0 to 44.9 in adult, unspecified obesity type (HCC) Ladawn is currently in the action stage of change. As such, her goal is to continue with weight loss efforts. She has agreed to the Category 4 Plan with lean meat equivalents.   Behavioral modification strategies: increasing lean protein intake and meal planning and cooking strategies.  Ermina has agreed to follow-up with our clinic in 2 to 3 weeks. She was informed of the importance of frequent follow-up visits to maximize her success with intensive lifestyle modifications for her multiple health conditions.   Objective:   Blood  pressure 111/76, pulse 72, temperature 98 F (36.7 C), temperature source Oral, height 5\' 8"  (1.727 m), weight 283 lb (128.4 kg), last menstrual period 01/10/2020, SpO2 98 %. Body mass index is 43.03 kg/m.  General: Cooperative, alert, well developed, in no acute distress. HEENT: Conjunctivae and lids unremarkable. Cardiovascular: Regular rhythm.  Lungs: Normal work of breathing. Neurologic: No focal deficits.   Lab Results  Component Value Date   CREATININE 0.60 12/20/2019   BUN 13 12/20/2019   NA 144 12/20/2019   K 4.1 12/20/2019   CL 109 (H) 12/20/2019   CO2 23 12/20/2019   Lab Results  Component Value Date   ALT 15 12/20/2019   AST 13 12/20/2019   ALKPHOS 107 12/20/2019   BILITOT <0.2 12/20/2019   Lab Results  Component Value Date   HGBA1C 6.0 (H) 12/20/2019   HGBA1C 6.2 (H) 08/23/2019   HGBA1C 5.8 (H) 04/18/2019   Lab Results  Component Value Date   INSULIN 9.8 12/20/2019   INSULIN 15.4 08/23/2019   INSULIN 15.7 04/18/2019   No results found for: TSH Lab Results  Component Value Date   CHOL 164 08/23/2019   HDL 61 08/23/2019   LDLCALC 90 08/23/2019   TRIG 65 08/23/2019   Lab Results  Component Value Date   WBC 6.2 08/23/2019   HGB 12.3 08/23/2019   HCT 37.5 08/23/2019   MCV 84 08/23/2019   PLT 292 08/23/2019   No results found for: IRON, TIBC, FERRITIN  Attestation Statements:   Reviewed by clinician on day of visit: allergies, medications, problem  list, medical history, surgical history, family history, social history, and previous encounter notes.  Time spent on visit including pre-visit chart review and post-visit care and charting was 30 minutes.    I, Trixie Dredge, am acting as transcriptionist for Dennard Nip, MD.  I have reviewed the above documentation for accuracy and completeness, and I agree with the above. -  Dennard Nip, MD

## 2020-01-30 ENCOUNTER — Ambulatory Visit (INDEPENDENT_AMBULATORY_CARE_PROVIDER_SITE_OTHER): Payer: BC Managed Care – PPO | Admitting: Psychology

## 2020-01-30 DIAGNOSIS — F509 Eating disorder, unspecified: Secondary | ICD-10-CM

## 2020-02-16 ENCOUNTER — Other Ambulatory Visit: Payer: Self-pay

## 2020-02-16 ENCOUNTER — Encounter (INDEPENDENT_AMBULATORY_CARE_PROVIDER_SITE_OTHER): Payer: Self-pay | Admitting: Bariatrics

## 2020-02-16 ENCOUNTER — Ambulatory Visit (INDEPENDENT_AMBULATORY_CARE_PROVIDER_SITE_OTHER): Payer: BC Managed Care – PPO | Admitting: Bariatrics

## 2020-02-16 VITALS — BP 112/63 | HR 64 | Temp 97.6°F | Ht 68.0 in | Wt 285.0 lb

## 2020-02-16 DIAGNOSIS — Z9189 Other specified personal risk factors, not elsewhere classified: Secondary | ICD-10-CM | POA: Diagnosis not present

## 2020-02-16 DIAGNOSIS — F3289 Other specified depressive episodes: Secondary | ICD-10-CM

## 2020-02-16 DIAGNOSIS — Z6841 Body Mass Index (BMI) 40.0 and over, adult: Secondary | ICD-10-CM

## 2020-02-16 DIAGNOSIS — R7303 Prediabetes: Secondary | ICD-10-CM

## 2020-02-16 MED ORDER — PHENTERMINE-TOPIRAMATE ER 7.5-46 MG PO CP24: ORAL_CAPSULE | ORAL | 0 refills | Status: DC

## 2020-02-16 NOTE — Progress Notes (Signed)
Chief Complaint:   OBESITY Isabella Larson is here to discuss her progress with her obesity treatment plan along with follow-up of her obesity related diagnoses. Isabella Larson is on the Category 4 Plan and states she is following her eating plan approximately 75% of the time. Isabella Larson states she is exercising 0 minutes 0 times per week.  Today's visit was #: 15 Starting weight: 305 lbs Starting date: 04/18/2019 Today's weight: 285 lbs Today's date: 02/16/2020 Total lbs lost to date: 20 Total lbs lost since last in-office visit: 0  Interim History: [name] is up 2 lbs.  Subjective:   Other depression, with emotional eating. Isabella Larson is struggling with emotional eating and using food for comfort to the extent that it is negatively impacting her health. She has been working on behavior modification techniques to help reduce her emotional eating and has been somewhat successful. She shows no sign of suicidal or homicidal ideations. Isabella Larson is taking Trokendi. She is not drinking soft drinks. She is seeing a Veterinary surgeon and working on mindful eating.  Prediabetes. Isabella Larson has a diagnosis of prediabetes based on her elevated HgA1c and was informed this puts her at greater risk of developing diabetes. She continues to work on diet and exercise to decrease her risk of diabetes. She denies nausea or hypoglycemia. Isabella Larson is on no medication.  Lab Results  Component Value Date   HGBA1C 6.0 (H) 12/20/2019   Lab Results  Component Value Date   INSULIN 9.8 12/20/2019   INSULIN 15.4 08/23/2019   INSULIN 15.7 04/18/2019   At risk for malnutrition. Isabella Larson is at increased risk for malnutrition secondary to history of gastric bypass.  Assessment/Plan:   Other depression, with emotional eating. Behavior modification techniques were discussed today to help Isabella Larson deal with her emotional/non-hunger eating behaviors.  Orders and follow up as documented in patient record. Laynee will continue  Trokendi as directed; will taper off and stop the Trokendi when she starts Qsymia.  Prediabetes. Isabella Larson will continue to work on weight loss, exercise, increasing healthy fats and protein, and decreasing simple carbohydrates to help decrease the risk of diabetes.   At risk for malnutrition. Isabella Larson was given approximately 15 minutes of counseling today regarding prevention of malnutrition and ways to meet macronutrient goals.  Class 3 severe obesity with serious comorbidity and body mass index (BMI) of 40.0 to 44.9 in adult, unspecified obesity type (HCC). Prescription was given for Phentermine-Topiramate 7.5-46 MG CP24 PO 1 daily #30 with 0 refills.  Isabella Larson is currently in the action stage of change. As such, her goal is to continue with weight loss efforts. She has agreed to the Category 4 Plan.   She will work on meal planning and intentional eating.   Exercise goals: All adults should avoid inactivity. Some physical activity is better than none, and adults who participate in any amount of physical activity gain some health benefits.  Behavioral modification strategies: increasing lean protein intake, decreasing simple carbohydrates, increasing vegetables, increasing water intake, decreasing eating out, no skipping meals, meal planning and cooking strategies, keeping healthy foods in the home and planning for success.  Isabella Larson has agreed to follow-up with our clinic in 3-4 weeks. She was informed of the importance of frequent follow-up visits to maximize her success with intensive lifestyle modifications for her multiple health conditions.   Objective:   Blood pressure 112/63, pulse 64, temperature 97.6 F (36.4 C), temperature source Oral, height 5\' 8"  (1.727 m), weight 285 lb (129.3 kg), last menstrual period  02/12/2020, SpO2 98 %. Body mass index is 43.33 kg/m.  General: Cooperative, alert, well developed, in no acute distress. HEENT: Conjunctivae and lids  unremarkable. Cardiovascular: Regular rhythm.  Lungs: Normal work of breathing. Neurologic: No focal deficits.   Lab Results  Component Value Date   CREATININE 0.60 12/20/2019   BUN 13 12/20/2019   NA 144 12/20/2019   K 4.1 12/20/2019   CL 109 (H) 12/20/2019   CO2 23 12/20/2019   Lab Results  Component Value Date   ALT 15 12/20/2019   AST 13 12/20/2019   ALKPHOS 107 12/20/2019   BILITOT <0.2 12/20/2019   Lab Results  Component Value Date   HGBA1C 6.0 (H) 12/20/2019   HGBA1C 6.2 (H) 08/23/2019   HGBA1C 5.8 (H) 04/18/2019   Lab Results  Component Value Date   INSULIN 9.8 12/20/2019   INSULIN 15.4 08/23/2019   INSULIN 15.7 04/18/2019   No results found for: TSH Lab Results  Component Value Date   CHOL 164 08/23/2019   HDL 61 08/23/2019   LDLCALC 90 08/23/2019   TRIG 65 08/23/2019   Lab Results  Component Value Date   WBC 6.2 08/23/2019   HGB 12.3 08/23/2019   HCT 37.5 08/23/2019   MCV 84 08/23/2019   PLT 292 08/23/2019   No results found for: IRON, TIBC, FERRITIN  Attestation Statements:   Reviewed by clinician on day of visit: allergies, medications, problem list, medical history, surgical history, family history, social history, and previous encounter notes.  Migdalia Dk, am acting as Location manager for [employer], DO   I have reviewed the above documentation for accuracy and completeness, and I agree with the above. Jearld Lesch, DO

## 2020-02-17 ENCOUNTER — Other Ambulatory Visit (INDEPENDENT_AMBULATORY_CARE_PROVIDER_SITE_OTHER): Payer: Self-pay | Admitting: Family Medicine

## 2020-02-17 DIAGNOSIS — F3289 Other specified depressive episodes: Secondary | ICD-10-CM

## 2020-03-08 ENCOUNTER — Ambulatory Visit (INDEPENDENT_AMBULATORY_CARE_PROVIDER_SITE_OTHER): Payer: BC Managed Care – PPO | Admitting: Psychology

## 2020-03-08 DIAGNOSIS — F509 Eating disorder, unspecified: Secondary | ICD-10-CM

## 2020-03-14 ENCOUNTER — Other Ambulatory Visit: Payer: Self-pay

## 2020-03-14 ENCOUNTER — Encounter (INDEPENDENT_AMBULATORY_CARE_PROVIDER_SITE_OTHER): Payer: Self-pay | Admitting: Bariatrics

## 2020-03-14 ENCOUNTER — Ambulatory Visit (INDEPENDENT_AMBULATORY_CARE_PROVIDER_SITE_OTHER): Payer: BC Managed Care – PPO | Admitting: Bariatrics

## 2020-03-14 VITALS — BP 128/83 | HR 64 | Temp 97.7°F | Ht 68.0 in | Wt 287.0 lb

## 2020-03-14 DIAGNOSIS — Z9189 Other specified personal risk factors, not elsewhere classified: Secondary | ICD-10-CM | POA: Diagnosis not present

## 2020-03-14 DIAGNOSIS — Z9884 Bariatric surgery status: Secondary | ICD-10-CM

## 2020-03-14 DIAGNOSIS — R7303 Prediabetes: Secondary | ICD-10-CM

## 2020-03-14 DIAGNOSIS — Z6841 Body Mass Index (BMI) 40.0 and over, adult: Secondary | ICD-10-CM

## 2020-03-14 MED ORDER — PHENTERMINE-TOPIRAMATE ER 7.5-46 MG PO CP24: ORAL_CAPSULE | ORAL | 0 refills | Status: DC

## 2020-03-15 ENCOUNTER — Encounter (INDEPENDENT_AMBULATORY_CARE_PROVIDER_SITE_OTHER): Payer: Self-pay | Admitting: Bariatrics

## 2020-03-15 NOTE — Progress Notes (Signed)
Chief Complaint:   OBESITY Isabella Larson is here to discuss her progress with her obesity treatment plan along with follow-up of her obesity related diagnoses. Isabella Larson is on the Category 4 Plan and states she is following her eating plan approximately 50% of the time. Isabella Larson states she is exercising 0 minutes 0 times per week.  Today's visit was #: 23 Starting weight: 305 lbs Starting date: 04/18/2019 Today's weight: 287 lbs Today's date: 03/14/2020 Total lbs lost to date: 18 Total lbs lost since last in-office visit: 0  Interim History: Isabella Larson is up 2 lbs. She just got back from vacation. Her body fat went down from 50.50 to 47.3%.  Subjective:   History of gastric bypass. Isabella Larson is taking vitamins. She does have some restriction.  Prediabetes. Isabella Larson has a diagnosis of prediabetes based on her elevated HgA1c and was informed this puts her at greater risk of developing diabetes. She continues to work on diet and exercise to decrease her risk of diabetes. She denies nausea or hypoglycemia.  Lab Results  Component Value Date   HGBA1C 6.0 (H) 12/20/2019   Lab Results  Component Value Date   INSULIN 9.8 12/20/2019   INSULIN 15.4 08/23/2019   INSULIN 15.7 04/18/2019   At risk for diabetes mellitus. Isabella Larson is at higher than average risk for developing diabetes due to prediabetes.  Assessment/Plan:   History of gastric bypass. Isabella Larson will continue her vitamins as directed.  Prediabetes. Isabella Larson will continue to work on weight loss, exercise, and decreasing simple carbohydrates to help decrease the risk of diabetes. She will work on meal planning and intentional eating.   At risk for diabetes mellitus. Isabella Larson was given approximately 15 minutes of diabetes education and counseling today. We discussed intensive lifestyle modifications today with an emphasis on weight loss as well as increasing exercise and decreasing simple carbohydrates in her diet. We also  reviewed medication options with an emphasis on risk versus benefit of those discussed.   Repetitive spaced learning was employed today to elicit superior memory formation and behavioral change.  Class 3 severe obesity with serious comorbidity and body mass index (BMI) of 40.0 to 44.9 in adult, unspecified obesity type ([place]). Prescription was given for Phentermine-Topiramate 7.5-46 MG CP24 1 daily in the a.m. #30 with 0 refills.  Isabella Larson is currently in the action stage of change. As such, her goal is to continue with weight loss efforts. She has agreed to the Category 4 Plan.  She will work on meal planning and intentional eating.    She will sign up for Qsymia Engage (24 hours).  Exercise goals: For substantial health benefits, adults should do at least 150 minutes (2 hours and 30 minutes) a week of moderate-intensity, or 75 minutes (1 hour and 15 minutes) a week of vigorous-intensity aerobic physical activity, or an equivalent combination of moderate- and vigorous-intensity aerobic activity. Aerobic activity should be performed in episodes of at least 10 minutes, and preferably, it should be spread throughout the week.  Behavioral modification strategies: increasing lean protein intake, decreasing simple carbohydrates, increasing vegetables, increasing water intake, decreasing eating out, no skipping meals, meal planning and cooking strategies, keeping healthy foods in the home and planning for success.  Isabella Larson has agreed to follow-up with our clinic in 2 weeks. She was informed of the importance of frequent follow-up visits to maximize her success with intensive lifestyle modifications for her multiple health conditions.   Objective:   Blood pressure 128/83, pulse 64, temperature 97.7  F (36.5 C), height 5\' 8"  (1.727 m), weight 287 lb (130.2 kg), SpO2 99 %. Body mass index is 43.64 kg/m.  General: Cooperative, alert, well developed, in no acute distress. HEENT: Conjunctivae and lids  unremarkable. Cardiovascular: Regular rhythm.  Lungs: Normal work of breathing. Neurologic: No focal deficits.   Lab Results  Component Value Date   CREATININE 0.60 12/20/2019   BUN 13 12/20/2019   NA 144 12/20/2019   K 4.1 12/20/2019   CL 109 (H) 12/20/2019   CO2 23 12/20/2019   Lab Results  Component Value Date   ALT 15 12/20/2019   AST 13 12/20/2019   ALKPHOS 107 12/20/2019   BILITOT <0.2 12/20/2019   Lab Results  Component Value Date   HGBA1C 6.0 (H) 12/20/2019   HGBA1C 6.2 (H) 08/23/2019   HGBA1C 5.8 (H) 04/18/2019   Lab Results  Component Value Date   INSULIN 9.8 12/20/2019   INSULIN 15.4 08/23/2019   INSULIN 15.7 04/18/2019   No results found for: TSH Lab Results  Component Value Date   CHOL 164 08/23/2019   HDL 61 08/23/2019   LDLCALC 90 08/23/2019   TRIG 65 08/23/2019   Lab Results  Component Value Date   WBC 6.2 08/23/2019   HGB 12.3 08/23/2019   HCT 37.5 08/23/2019   MCV 84 08/23/2019   PLT 292 08/23/2019   No results found for: IRON, TIBC, FERRITIN  Attestation Statements:   Reviewed by clinician on day of visit: allergies, medications, problem list, medical history, surgical history, family history, social history, and previous encounter notes.  Time spent on visit including pre-visit chart review and post-visit charting and care was 20 minutes.   14/09/2018, am acting as Fernanda Drum for Energy manager, DO   I have reviewed the above documentation for accuracy and completeness, and I agree with the above. [employer], DO

## 2020-03-22 ENCOUNTER — Encounter (INDEPENDENT_AMBULATORY_CARE_PROVIDER_SITE_OTHER): Payer: Self-pay

## 2020-03-27 ENCOUNTER — Other Ambulatory Visit: Payer: Self-pay

## 2020-03-27 ENCOUNTER — Ambulatory Visit (INDEPENDENT_AMBULATORY_CARE_PROVIDER_SITE_OTHER): Payer: BC Managed Care – PPO | Admitting: Bariatrics

## 2020-03-27 ENCOUNTER — Encounter (INDEPENDENT_AMBULATORY_CARE_PROVIDER_SITE_OTHER): Payer: Self-pay | Admitting: Bariatrics

## 2020-03-27 VITALS — BP 105/7 | HR 78 | Temp 98.0°F | Ht 68.0 in | Wt 286.0 lb

## 2020-03-27 DIAGNOSIS — Z6841 Body Mass Index (BMI) 40.0 and over, adult: Secondary | ICD-10-CM

## 2020-03-27 DIAGNOSIS — R7303 Prediabetes: Secondary | ICD-10-CM

## 2020-03-27 DIAGNOSIS — E038 Other specified hypothyroidism: Secondary | ICD-10-CM

## 2020-03-27 NOTE — Progress Notes (Signed)
Chief Complaint:   Isabella Larson is here to discuss her progress with her Isabella treatment plan along with follow-up of her Isabella related diagnoses. Isabella Larson is on the Category 4 Plan and states she is following her eating plan approximately 40% of the time. Isabella Larson states she is doing outdoor activities 60+ minutes 5 times per week.  Today's visit was #: 17 Starting weight: 305 lbs Starting date: 04/18/2019 Today's weight: 286 lbs Today's date: 03/27/2020 Total lbs lost to date: 19 Total lbs lost since last in-office visit: 1  Interim History: Isabella Larson went down an additional 1 lb and has done well overall. She was prescribed Qsymia at her last visit, but is not taking it yet. She was at church camp last week and was not able to control her food as well.  Subjective:   Other specified hypothyroidism. Isabella Larson is taking Synthroid.  No results found for: TSH  Prediabetes. Isabella Larson has a diagnosis of prediabetes based on her elevated HgA1c and was informed this puts her at greater risk of developing diabetes. She continues to work on diet and exercise to decrease her risk of diabetes. She denies nausea or hypoglycemia. No polyphagia.  Lab Results  Component Value Date   HGBA1C 6.0 (H) 12/20/2019   Lab Results  Component Value Date   INSULIN 9.8 12/20/2019   INSULIN 15.4 08/23/2019   INSULIN 15.7 04/18/2019   Assessment/Plan:   Other specified hypothyroidism. Patient with long-standing hypothyroidism, on levothyroxine therapy. She appears euthyroid. Orders and follow up as documented in patient record. Isabella Larson will continue her medication as directed.   Counseling  Good thyroid control is important for overall health. Supratherapeutic thyroid levels are dangerous and will not improve weight loss results.  The correct way to take levothyroxine is fasting, with water, separated by at least 30 minutes from breakfast, and separated by more than 4 hours from  calcium, iron, multivitamins, acid reflux medications (PPIs).   Prediabetes. Isabella Larson will continue to work on weight loss, increasing her activities, and decreasing simple carbohydrates to help decrease the risk of diabetes.   Class 3 severe Isabella with serious comorbidity and body mass index (BMI) of 40.0 to 44.9 in adult, unspecified Isabella type (HCC).  Isabella Larson is currently in the action stage of change. As such, her goal is to continue with weight loss efforts. She has agreed to the Category 4 Plan.   She will work on meal planning, intentional eating, increasing her protein intake, and being more adherent to the plan.  Exercise goals: All adults should avoid inactivity. Some physical activity is better than none, and adults who participate in any amount of physical activity gain some health benefits.  Behavioral modification strategies: increasing lean protein intake, decreasing simple carbohydrates, increasing vegetables, increasing water intake, decreasing eating out, no skipping meals, meal planning and cooking strategies, keeping healthy foods in the home and planning for success.  Isabella Larson has agreed to follow-up with our clinic in 2-3 weeks. She was informed of the importance of frequent follow-up visits to maximize her success with intensive lifestyle modifications for her multiple health conditions.   Objective:   Blood pressure (!) 105/7, pulse 78, temperature 98 F (36.7 C), height 5\' 8"  (1.727 m), weight 286 lb (129.7 kg), SpO2 98 %. Body mass index is 43.49 kg/m.  General: Cooperative, alert, well developed, in no acute distress. HEENT: Conjunctivae and lids unremarkable. Cardiovascular: Regular rhythm.  Lungs: Normal work of breathing. Neurologic: No focal deficits.  Lab Results  Component Value Date   CREATININE 0.60 12/20/2019   BUN 13 12/20/2019   NA 144 12/20/2019   K 4.1 12/20/2019   CL 109 (H) 12/20/2019   CO2 23 12/20/2019   Lab Results  Component  Value Date   ALT 15 12/20/2019   AST 13 12/20/2019   ALKPHOS 107 12/20/2019   BILITOT <0.2 12/20/2019   Lab Results  Component Value Date   HGBA1C 6.0 (H) 12/20/2019   HGBA1C 6.2 (H) 08/23/2019   HGBA1C 5.8 (H) 04/18/2019   Lab Results  Component Value Date   INSULIN 9.8 12/20/2019   INSULIN 15.4 08/23/2019   INSULIN 15.7 04/18/2019   No results found for: TSH Lab Results  Component Value Date   CHOL 164 08/23/2019   HDL 61 08/23/2019   LDLCALC 90 08/23/2019   TRIG 65 08/23/2019   Lab Results  Component Value Date   WBC 6.2 08/23/2019   HGB 12.3 08/23/2019   HCT 37.5 08/23/2019   MCV 84 08/23/2019   PLT 292 08/23/2019   No results found for: IRON, TIBC, FERRITIN  Attestation Statements:   Reviewed by clinician on day of visit: allergies, medications, problem list, medical history, surgical history, family history, social history, and previous encounter notes.  Time spent on visit including pre-visit chart review and post-visit charting and care was 20 minutes.   Fernanda Drum, am acting as Energy manager for [employer], DO   I have reviewed the above documentation for accuracy and completeness, and I agree with the above. Corinna Capra, DO

## 2020-04-05 ENCOUNTER — Ambulatory Visit (INDEPENDENT_AMBULATORY_CARE_PROVIDER_SITE_OTHER): Payer: BC Managed Care – PPO | Admitting: Psychology

## 2020-04-05 DIAGNOSIS — F4323 Adjustment disorder with mixed anxiety and depressed mood: Secondary | ICD-10-CM | POA: Diagnosis not present

## 2020-04-12 ENCOUNTER — Ambulatory Visit (INDEPENDENT_AMBULATORY_CARE_PROVIDER_SITE_OTHER): Payer: BC Managed Care – PPO | Admitting: Bariatrics

## 2020-04-26 ENCOUNTER — Other Ambulatory Visit: Payer: Self-pay

## 2020-04-26 ENCOUNTER — Encounter (INDEPENDENT_AMBULATORY_CARE_PROVIDER_SITE_OTHER): Payer: Self-pay | Admitting: Bariatrics

## 2020-04-26 ENCOUNTER — Ambulatory Visit (INDEPENDENT_AMBULATORY_CARE_PROVIDER_SITE_OTHER): Payer: BC Managed Care – PPO | Admitting: Bariatrics

## 2020-04-26 VITALS — BP 123/81 | HR 72 | Temp 98.5°F | Ht 68.0 in | Wt 284.0 lb

## 2020-04-26 DIAGNOSIS — E038 Other specified hypothyroidism: Secondary | ICD-10-CM | POA: Diagnosis not present

## 2020-04-26 DIAGNOSIS — Z9189 Other specified personal risk factors, not elsewhere classified: Secondary | ICD-10-CM

## 2020-04-26 DIAGNOSIS — Z9884 Bariatric surgery status: Secondary | ICD-10-CM

## 2020-04-26 DIAGNOSIS — R7303 Prediabetes: Secondary | ICD-10-CM

## 2020-04-26 DIAGNOSIS — D508 Other iron deficiency anemias: Secondary | ICD-10-CM | POA: Diagnosis not present

## 2020-04-26 DIAGNOSIS — Z6841 Body Mass Index (BMI) 40.0 and over, adult: Secondary | ICD-10-CM

## 2020-04-26 MED ORDER — QSYMIA 7.5-46 MG PO CP24: ORAL_CAPSULE | ORAL | 0 refills | Status: DC

## 2020-04-30 ENCOUNTER — Ambulatory Visit (INDEPENDENT_AMBULATORY_CARE_PROVIDER_SITE_OTHER): Payer: BC Managed Care – PPO | Admitting: Psychology

## 2020-04-30 ENCOUNTER — Other Ambulatory Visit (INDEPENDENT_AMBULATORY_CARE_PROVIDER_SITE_OTHER): Payer: Self-pay | Admitting: Bariatrics

## 2020-04-30 ENCOUNTER — Encounter (INDEPENDENT_AMBULATORY_CARE_PROVIDER_SITE_OTHER): Payer: Self-pay | Admitting: Bariatrics

## 2020-04-30 DIAGNOSIS — F509 Eating disorder, unspecified: Secondary | ICD-10-CM | POA: Diagnosis not present

## 2020-04-30 MED ORDER — POLYSACCHARIDE IRON COMPLEX 150 MG PO CAPS: 150.0000 mg | ORAL_CAPSULE | Freq: Two times a day (BID) | ORAL | 0 refills | Status: DC

## 2020-04-30 NOTE — Progress Notes (Signed)
Call pt. Both her iron saturation level and her ferritin levels are very low. She has had iron infusions in the past. She should go ahead and set it up to have an iron infusions. Ask if she needs any assistance with the process.

## 2020-04-30 NOTE — Progress Notes (Signed)
Chief Complaint:   OBESITY Isabella Larson is here to discuss her progress with her obesity treatment plan along with follow-up of her obesity related diagnoses. [name] is on the Category 4 Plan and states she is following her eating plan approximately 50% of the time. [name] states she is exercising 0 minutes 0 times per week.  Today's visit was #: 18 Starting weight: 305 lbs Starting date: 04/18/2019 Today's weight: 284 lbs Today's date: 04/26/2020 Total lbs lost to date: 21 Total lbs lost since last in-office visit: 2  Interim History: [name] is down 2 lbs and has done well overall.  Subjective:   Other specified hypothyroidism. [name] reports having normal energy levels.   Lab Results  Component Value Date   TSH 5.300 (H) 04/26/2020   Prediabetes. [name] has a diagnosis of prediabetes based on her elevated HgA1c and was informed this puts her at greater risk of developing diabetes. She continues to work on diet and exercise to decrease her risk of diabetes. She denies nausea or hypoglycemia. No polyphagia.  Lab Results  Component Value Date   HGBA1C 6.3 (H) 04/26/2020   Lab Results  Component Value Date   INSULIN 14.8 04/26/2020   INSULIN 9.8 12/20/2019   INSULIN 15.4 08/23/2019   INSULIN 15.7 04/18/2019   History of gastric bypass. [name] reports some restriction.  Other iron deficiency anemia. [name] is taking iron gummies and reports having had iron infusions.   CBC Latest Ref Rng & Units 04/26/2020 08/23/2019 09/23/2013  WBC 3.4 - 10.8 x10E3/uL - 6.2 5.3  Hemoglobin 11.1 - 15.9 g/dL - 21.1 11.5(L)  Hematocrit 34.0 - 46.6 % 32.7(L) 37.5 36.3  Platelets 150 - 450 x10E3/uL - 292 214   Lab Results  Component Value Date   IRON 20 (L) 04/26/2020   TIBC 413 04/26/2020   FERRITIN 5 (L) 04/26/2020   Lab Results  Component Value Date   VITAMINB12 1,040 04/26/2020   At risk for activity intolerance. [name] is at risk of exercise intolerance due  to hypothyroidism.  Assessment/Plan:   Other specified hypothyroidism. Patient with long-standing hypothyroidism, on levothyroxine therapy. She appears euthyroid. Orders and follow up as documented in patient record. Lipid Panel With LDL/HDL Ratio and CMP will be checked today.  Counseling . Good thyroid control is important for overall health. Supratherapeutic thyroid levels are dangerous and will not improve weight loss results.  . The correct way to take levothyroxine is fasting, with water, separated by at least 30 minutes from breakfast, and separated by more than 4 hours from calcium, iron, multivitamins, acid reflux medications (PPIs).    Prediabetes. [name] will continue to work on weight loss, exercise, and decreasing simple carbohydrates to help decrease the risk of diabetes. Hemoglobin A1c, Insulin, random levels will be checked today.  History of gastric bypass. Vitamin B12, Vitamin B1, Zinc, and Vitamin D levels will be checked today.  Other iron deficiency anemia. Orders and follow up as documented in patient record. Anemia panel will be checked today.  Counseling . Iron is essential for our bodies to make red blood cells.  Reasons that someone may be deficient include: an iron-deficient diet (more likely in those following vegan or vegetarian diets), women with heavy menses, patients with GI disorders or poor absorption, patients that have had bariatric surgery, frequent blood donors, patients with cancer, and patients with heart disease.   [name] An iron supplement has been recommended. This is found over-the-counter.  [name] foods include dark leafy  greens, red and white meats, eggs, seafood, and beans.   . Certain foods and drinks prevent your body from absorbing iron properly. Avoid eating these foods in the same meal as iron-rich foods or with iron supplements. These foods include: coffee, black tea, and red wine; milk, dairy products, and foods that are high in calcium;  beans and soybeans; whole grains.  . Constipation can be a side effect of iron supplementation. Increased water and fiber intake are helpful. Water goal: > 2 liters/day. Fiber goal: > 25 grams/day.   At risk for activity intolerance. [name] was given approximately 15 minutes of exercise intolerance counseling today. She is 39 y.o. female and has risk factors exercise intolerance including obesity. We discussed intensive lifestyle modifications today with an emphasis on specific weight loss instructions and strategies. [name] will slowly increase activity as tolerated.  Repetitive spaced learning was employed today to elicit superior memory formation and behavioral change.  Class 3 severe obesity with serious comorbidity and body mass index (BMI) of 40.0 to 44.9 in adult, unspecified obesity type ([facility]CC). [name] was given a prescription for Qsymia 7.5/46 mg 1 daily #30 with 0 refills.  [name] is currently in the action stage of change. As such, her goal is to continue with weight loss efforts. She has agreed to the Category 4 Plan.   She will work on meal planning and intentional eating.   Exercise goals: All adults should avoid inactivity. Some physical activity is better than none, and adults who participate in any amount of physical activity gain some health benefits.  Behavioral modification strategies: increasing lean protein intake, decreasing simple carbohydrates, increasing vegetables, increasing water intake, decreasing eating out, no skipping meals, meal planning and cooking strategies, keeping healthy foods in the home and planning for success.  [name] has agreed to follow-up with our clinic in 2-3 weeks. She was informed of the importance of frequent follow-up visits to maximize her success with intensive lifestyle modifications for her multiple health conditions.   [name] was informed we would discuss her lab results at her next visit unless there is a critical issue that  needs to be addressed sooner. Tea agreed to keep her next visit at the agreed upon time to discuss these results.  Objective:   Blood pressure 123/81, pulse 72, temperature 98.5 F (36.9 C), height 5\' 8"  (1.727 m), weight 284 lb (128.8 kg), SpO2 98 %. Body mass index is 43.18 kg/m.  General: Cooperative, alert, well developed, in no acute distress. HEENT: Conjunctivae and lids unremarkable. Cardiovascular: Regular rhythm.  Lungs: Normal work of breathing. Neurologic: No focal deficits.   Lab Results  Component Value Date   CREATININE 0.67 04/26/2020   BUN 14 04/26/2020   NA 139 04/26/2020   K 4.7 04/26/2020   CL 104 04/26/2020   CO2 24 04/26/2020   Lab Results  Component Value Date   ALT 17 04/26/2020   AST 19 04/26/2020   ALKPHOS 114 04/26/2020   BILITOT <0.2 04/26/2020   Lab Results  Component Value Date   HGBA1C 6.3 (H) 04/26/2020   HGBA1C 6.0 (H) 12/20/2019   HGBA1C 6.2 (H) 08/23/2019   HGBA1C 5.8 (H) 04/18/2019   Lab Results  Component Value Date   INSULIN 14.8 04/26/2020   INSULIN 9.8 12/20/2019   INSULIN 15.4 08/23/2019   INSULIN 15.7 04/18/2019   Lab Results  Component Value Date   TSH 5.300 (H) 04/26/2020   Lab Results  Component Value Date   CHOL 171 04/26/2020  HDL 56 04/26/2020   LDLCALC 102 (H) 04/26/2020   TRIG 65 04/26/2020   Lab Results  Component Value Date   WBC 6.2 08/23/2019   HGB 12.3 08/23/2019   HCT 32.7 (L) 04/26/2020   MCV 84 08/23/2019   PLT 292 08/23/2019   Lab Results  Component Value Date   IRON 20 (L) 04/26/2020   TIBC 413 04/26/2020   FERRITIN 5 (L) 04/26/2020   Attestation Statements:   Reviewed by clinician on day of visit: allergies, medications, problem list, medical history, surgical history, family history, social history, and previous encounter notes.  Fernanda Drum, am acting as Energy manager for [employer], DO   I have reviewed the above documentation for accuracy and completeness, and I  agree with the above. Corinna Capra, DO

## 2020-04-30 NOTE — Progress Notes (Signed)
Pt called back and she is starting work tomorrow and will not be able to take a day off for a awhile.  She would like to know if she can increase what she is taking (4.5 mg twice daily for total of 9 mgs)  and do that for a certain amount of time and then be retested.

## 2020-04-30 NOTE — Progress Notes (Signed)
Left msg for pt to return call re: labs

## 2020-04-30 NOTE — Progress Notes (Signed)
Pt advised.

## 2020-04-30 NOTE — Progress Notes (Signed)
Correct, she is taking 9 mg qd but wants to know if she can/should take more

## 2020-05-01 LAB — ZINC: Zinc: 77 ug/dL (ref 44–115)

## 2020-05-01 LAB — LIPID PANEL WITH LDL/HDL RATIO
Cholesterol, Total: 171 mg/dL (ref 100–199)
HDL: 56 mg/dL (ref >39)
LDL Chol Calc (NIH): 102 mg/dL — ABNORMAL HIGH (ref 0–99)
LDL/HDL Ratio: 1.8 ratio (ref 0.0–3.2)
Triglycerides: 65 mg/dL (ref 0–149)
VLDL Cholesterol Cal: 13 mg/dL (ref 5–40)

## 2020-05-01 LAB — COMPREHENSIVE METABOLIC PANEL
ALT: 17 IU/L (ref 0–32)
AST: 19 IU/L (ref 0–40)
Albumin/Globulin Ratio: 1.7 (ref 1.2–2.2)
Albumin: 4 g/dL (ref 3.8–4.8)
Alkaline Phosphatase: 114 IU/L (ref 48–121)
BUN/Creatinine Ratio: 21 (ref 9–23)
BUN: 14 mg/dL (ref 6–20)
Bilirubin Total: <0.2 mg/dL (ref 0.0–1.2)
CO2: 24 mmol/L (ref 20–29)
Calcium: 8.8 mg/dL (ref 8.7–10.2)
Chloride: 104 mmol/L (ref 96–106)
Creatinine, Ser: 0.67 mg/dL (ref 0.57–1.00)
GFR calc Af Amer: 130 mL/min/{1.73_m2} (ref >59)
GFR calc non Af Amer: 113 mL/min/{1.73_m2} (ref >59)
Globulin, Total: 2.4 g/dL (ref 1.5–4.5)
Glucose: 97 mg/dL (ref 65–99)
Potassium: 4.7 mmol/L (ref 3.5–5.2)
Sodium: 139 mmol/L (ref 134–144)
Total Protein: 6.4 g/dL (ref 6.0–8.5)

## 2020-05-01 LAB — ANEMIA PANEL
Ferritin: 5 ng/mL — ABNORMAL LOW (ref 15–150)
Folate, Hemolysate: 334 ng/mL
Folate, RBC: 1021 ng/mL (ref >498)
Hematocrit: 32.7 % — ABNORMAL LOW (ref 34.0–46.6)
Iron Saturation: 5 % — CL (ref 15–55)
Iron: 20 ug/dL — ABNORMAL LOW (ref 27–159)
Retic Ct Pct: 1.3 % (ref 0.6–2.6)
Total Iron Binding Capacity: 413 ug/dL (ref 250–450)
UIBC: 393 ug/dL (ref 131–425)
Vitamin B-12: 1040 pg/mL (ref 232–1245)

## 2020-05-01 LAB — HEMOGLOBIN A1C
Est. average glucose Bld gHb Est-mCnc: 134 mg/dL
Hgb A1c MFr Bld: 6.3 % — ABNORMAL HIGH (ref 4.8–5.6)

## 2020-05-01 LAB — TSH: TSH: 5.3 u[IU]/mL — ABNORMAL HIGH (ref 0.450–4.500)

## 2020-05-01 LAB — INSULIN, RANDOM: INSULIN: 14.8 u[IU]/mL (ref 2.6–24.9)

## 2020-05-01 LAB — T3: T3, Total: 94 ng/dL (ref 71–180)

## 2020-05-01 LAB — VITAMIN B1: Thiamine: 91.5 nmol/L (ref 66.5–200.0)

## 2020-05-01 LAB — VITAMIN D 25 HYDROXY (VIT D DEFICIENCY, FRACTURES): Vit D, 25-Hydroxy: 39.8 ng/mL (ref 30.0–100.0)

## 2020-05-01 LAB — T4, FREE: Free T4: 1.55 ng/dL (ref 0.82–1.77)

## 2020-05-17 ENCOUNTER — Encounter (INDEPENDENT_AMBULATORY_CARE_PROVIDER_SITE_OTHER): Payer: Self-pay | Admitting: Bariatrics

## 2020-05-17 ENCOUNTER — Ambulatory Visit (INDEPENDENT_AMBULATORY_CARE_PROVIDER_SITE_OTHER): Payer: BC Managed Care – PPO | Admitting: Bariatrics

## 2020-05-17 ENCOUNTER — Other Ambulatory Visit: Payer: Self-pay

## 2020-05-17 VITALS — BP 99/68 | HR 69 | Temp 98.6°F | Ht 68.0 in | Wt 279.0 lb

## 2020-05-17 DIAGNOSIS — E038 Other specified hypothyroidism: Secondary | ICD-10-CM | POA: Diagnosis not present

## 2020-05-17 DIAGNOSIS — D508 Other iron deficiency anemias: Secondary | ICD-10-CM | POA: Diagnosis not present

## 2020-05-17 DIAGNOSIS — R7303 Prediabetes: Secondary | ICD-10-CM

## 2020-05-17 DIAGNOSIS — Z6841 Body Mass Index (BMI) 40.0 and over, adult: Secondary | ICD-10-CM

## 2020-05-21 ENCOUNTER — Encounter (INDEPENDENT_AMBULATORY_CARE_PROVIDER_SITE_OTHER): Payer: Self-pay | Admitting: Bariatrics

## 2020-05-21 NOTE — Progress Notes (Signed)
Chief Complaint:   OBESITY Isabella Larson is here to discuss her progress with her obesity treatment plan along with follow-up of her obesity related diagnoses. Isabella Larson is on the Category 4 Plan and states she is following her eating plan approximately 60-80% of the time. Isabella Larson states she is exercising 0 minutes 0 times per week.  Today's visit was #: 19 Starting weight: 305 lbs Starting date: 04/18/2019 Today's weight: 279 lbs Today's date: 05/17/2020 Total lbs lost to date: 26 Total lbs lost since last in-office visit: 5  Interim History: Isabella Larson is down 5 lbs since her last visit. She is taking Qsymia (recently started) and states that her appetite is diminished. On checking PDMP, there were no red flags. She is trying to get in her protein.  Subjective:   Prediabetes. [name] has a diagnosis of prediabetes based on her elevated HgA1c and was informed this puts her at greater risk of developing diabetes. She continues to work on diet and exercise to decrease her risk of diabetes. She denies nausea or hypoglycemia. Isabella Larson reports decreased appetite.  Lab Results  Component Value Date   HGBA1C 6.3 (H) 04/26/2020   Lab Results  Component Value Date   INSULIN 14.8 04/26/2020   INSULIN 9.8 12/20/2019   INSULIN 15.4 08/23/2019   INSULIN 15.7 04/18/2019   Other specified hypothyroidism. Isabella Larson is taking Synthroid.   Lab Results  Component Value Date   TSH 5.300 (H) 04/26/2020   Other iron deficiency anemia. Isabella Larson has a history of gastric bypass. She is taking iron medications with Vitamin C and denies side effects.   CBC Latest Ref Rng & Units 04/26/2020 08/23/2019 09/23/2013  WBC 3.4 - 10.8 x10E3/uL - 6.2 5.3  Hemoglobin 11.1 - 15.9 g/dL - 85.2 11.5(L)  Hematocrit 34.0 - 46.6 % 32.7(L) 37.5 36.3  Platelets 150 - 450 x10E3/uL - 292 214   Lab Results  Component Value Date   IRON 20 (L) 04/26/2020   TIBC 413 04/26/2020   FERRITIN 5 (L) 04/26/2020   Lab  Results  Component Value Date   VITAMINB12 1,040 04/26/2020   Assessment/Plan:   Prediabetes. Isabella Larson will continue to work on weight loss, increasing exercise, and decreasing simple carbohydrates to help decrease the risk of diabetes.   Other specified hypothyroidism. Patient with long-standing hypothyroidism, on levothyroxine therapy. She appears euthyroid. Orders and follow up as documented in patient record. Tequlia will continue her medication as directed.   Counseling . Good thyroid control is important for overall health. Supratherapeutic thyroid levels are dangerous and will not improve weight loss results. . The correct way to take levothyroxine is fasting, with water, separated by at least 30 minutes from breakfast, and separated by more than 4 hours from calcium, iron, multivitamins, acid reflux medications (PPIs).   Other iron deficiency anemia. Orders and follow up as documented in patient record. Iron levels will be checked in 2-3 weeks.  Counseling . Iron is essential for our bodies to make red blood cells.  Reasons that someone may be deficient include: an iron-deficient diet (more likely in those following vegan or vegetarian diets), women with heavy menses, patients with GI disorders or poor absorption, patients that have had bariatric surgery, frequent blood donors, patients with cancer, and patients with heart disease.   [name] An iron supplement has been recommended. This is found over-the-counter.   [name] foods include dark leafy greens, red and white meats, eggs, seafood, and beans.   . Certain foods and drinks  prevent your body from absorbing iron properly. Avoid eating these foods in the same meal as iron-rich foods or with iron supplements. These foods include: coffee, black tea, and red wine; milk, dairy products, and foods that are high in calcium; beans and soybeans; whole grains.  . Constipation can be a side effect of iron supplementation. Increased water and  fiber intake are helpful. Water goal: > 2 liters/day. Fiber goal: > 25 grams/day.  Class 3 severe obesity with serious comorbidity and body mass index (BMI) of 40.0 to 44.9 in adult, unspecified obesity type (HCC).  Isabella Larson is currently in the action stage of change. As such, her goal is to continue with weight loss efforts. She has agreed to the Category 4 Plan.   She will work on meal planning and intentional eating.   Handout was provided on Protein Equivalents.  Exercise goals: All adults should avoid inactivity. Some physical activity is better than none, and adults who participate in any amount of physical activity gain some health benefits.  Behavioral modification strategies: increasing lean protein intake, decreasing simple carbohydrates, increasing vegetables, increasing water intake, decreasing eating out, no skipping meals, meal planning and cooking strategies, keeping healthy foods in the home, better snacking choices, emotional eating strategies and planning for success.  Isabella Larson has agreed to follow-up with our clinic in 2-3 weeks. She was informed of the importance of frequent follow-up visits to maximize her success with intensive lifestyle modifications for her multiple health conditions.   Objective:   Blood pressure 99/68, pulse 69, temperature 98.6 F (37 C), height 5\' 8"  (1.727 m), weight 279 lb (126.6 kg), SpO2 98 %. Body mass index is 42.42 kg/m.  General: Cooperative, alert, well developed, in no acute distress. HEENT: Conjunctivae and lids unremarkable. Cardiovascular: Regular rhythm.  Lungs: Normal work of breathing. Neurologic: No focal deficits.   Lab Results  Component Value Date   CREATININE 0.67 04/26/2020   BUN 14 04/26/2020   NA 139 04/26/2020   K 4.7 04/26/2020   CL 104 04/26/2020   CO2 24 04/26/2020   Lab Results  Component Value Date   ALT 17 04/26/2020   AST 19 04/26/2020   ALKPHOS 114 04/26/2020   BILITOT <0.2 04/26/2020   Lab  Results  Component Value Date   HGBA1C 6.3 (H) 04/26/2020   HGBA1C 6.0 (H) 12/20/2019   HGBA1C 6.2 (H) 08/23/2019   HGBA1C 5.8 (H) 04/18/2019   Lab Results  Component Value Date   INSULIN 14.8 04/26/2020   INSULIN 9.8 12/20/2019   INSULIN 15.4 08/23/2019   INSULIN 15.7 04/18/2019   Lab Results  Component Value Date   TSH 5.300 (H) 04/26/2020   Lab Results  Component Value Date   CHOL 171 04/26/2020   HDL 56 04/26/2020   LDLCALC 102 (H) 04/26/2020   TRIG 65 04/26/2020   Lab Results  Component Value Date   WBC 6.2 08/23/2019   HGB 12.3 08/23/2019   HCT 32.7 (L) 04/26/2020   MCV 84 08/23/2019   PLT 292 08/23/2019   Lab Results  Component Value Date   IRON 20 (L) 04/26/2020   TIBC 413 04/26/2020   FERRITIN 5 (L) 04/26/2020   Attestation Statements:   Reviewed by clinician on day of visit: allergies, medications, problem list, medical history, surgical history, family history, social history, and previous encounter notes.  Time spent on visit including pre-visit chart review and post-visit charting and care was 20 minutes.   06/26/2020, am acting as Fernanda Drum for  Corinna Capra, DO   I have reviewed the above documentation for accuracy and completeness, and I agree with the above. Corinna Capra, DO

## 2020-05-22 ENCOUNTER — Ambulatory Visit (INDEPENDENT_AMBULATORY_CARE_PROVIDER_SITE_OTHER): Payer: BC Managed Care – PPO | Admitting: Psychology

## 2020-05-22 ENCOUNTER — Other Ambulatory Visit (INDEPENDENT_AMBULATORY_CARE_PROVIDER_SITE_OTHER): Payer: Self-pay | Admitting: Bariatrics

## 2020-05-22 DIAGNOSIS — F509 Eating disorder, unspecified: Secondary | ICD-10-CM

## 2020-06-05 ENCOUNTER — Ambulatory Visit (INDEPENDENT_AMBULATORY_CARE_PROVIDER_SITE_OTHER): Payer: BC Managed Care – PPO | Admitting: Psychology

## 2020-06-05 DIAGNOSIS — F509 Eating disorder, unspecified: Secondary | ICD-10-CM | POA: Diagnosis not present

## 2020-06-06 ENCOUNTER — Encounter (INDEPENDENT_AMBULATORY_CARE_PROVIDER_SITE_OTHER): Payer: Self-pay

## 2020-06-07 ENCOUNTER — Ambulatory Visit (INDEPENDENT_AMBULATORY_CARE_PROVIDER_SITE_OTHER): Payer: BC Managed Care – PPO | Admitting: Family Medicine

## 2020-06-12 ENCOUNTER — Other Ambulatory Visit: Payer: Self-pay

## 2020-06-12 ENCOUNTER — Ambulatory Visit (INDEPENDENT_AMBULATORY_CARE_PROVIDER_SITE_OTHER): Payer: BC Managed Care – PPO | Admitting: Bariatrics

## 2020-06-12 VITALS — BP 114/76 | HR 67 | Temp 97.5°F | Ht 68.0 in | Wt 276.0 lb

## 2020-06-12 DIAGNOSIS — Z6841 Body Mass Index (BMI) 40.0 and over, adult: Secondary | ICD-10-CM | POA: Diagnosis not present

## 2020-06-12 DIAGNOSIS — D508 Other iron deficiency anemias: Secondary | ICD-10-CM | POA: Diagnosis not present

## 2020-06-12 DIAGNOSIS — Z9189 Other specified personal risk factors, not elsewhere classified: Secondary | ICD-10-CM | POA: Diagnosis not present

## 2020-06-12 DIAGNOSIS — E038 Other specified hypothyroidism: Secondary | ICD-10-CM

## 2020-06-12 DIAGNOSIS — E66813 Obesity, class 3: Secondary | ICD-10-CM

## 2020-06-12 MED ORDER — POLYSACCHARIDE IRON COMPLEX 150 MG PO CAPS: 150.0000 mg | ORAL_CAPSULE | Freq: Two times a day (BID) | ORAL | 0 refills | Status: DC

## 2020-06-12 MED ORDER — QSYMIA 7.5-46 MG PO CP24: ORAL_CAPSULE | ORAL | 0 refills | Status: DC

## 2020-06-13 ENCOUNTER — Telehealth (INDEPENDENT_AMBULATORY_CARE_PROVIDER_SITE_OTHER): Payer: Self-pay | Admitting: Bariatrics

## 2020-06-13 ENCOUNTER — Encounter (INDEPENDENT_AMBULATORY_CARE_PROVIDER_SITE_OTHER): Payer: Self-pay

## 2020-06-13 DIAGNOSIS — D508 Other iron deficiency anemias: Secondary | ICD-10-CM

## 2020-06-13 LAB — ANEMIA PANEL
Ferritin: 5 ng/mL — ABNORMAL LOW (ref 15–150)
Folate, Hemolysate: 374 ng/mL
Folate, RBC: 1191 ng/mL (ref >498)
Hematocrit: 31.4 % — ABNORMAL LOW (ref 34.0–46.6)
Iron Saturation: 3 % — CL (ref 15–55)
Iron: 13 ug/dL — ABNORMAL LOW (ref 27–159)
Retic Ct Pct: 1.3 % (ref 0.6–2.6)
Total Iron Binding Capacity: 400 ug/dL (ref 250–450)
UIBC: 387 ug/dL (ref 131–425)
Vitamin B-12: >2000 pg/mL — ABNORMAL HIGH (ref 232–1245)

## 2020-06-13 NOTE — Telephone Encounter (Signed)
Left message on voicemail for pt to return call  

## 2020-06-13 NOTE — Telephone Encounter (Signed)
Isabella Larson, I faxed the labs to the pt's PCP and called and notified them that she needs appointment as soon as possible for iron infusion. I also notified the pt  and sent her a mychart message requesting her to notify us if she needs any further assistance.

## 2020-06-14 ENCOUNTER — Encounter (INDEPENDENT_AMBULATORY_CARE_PROVIDER_SITE_OTHER): Payer: Self-pay | Admitting: Bariatrics

## 2020-06-14 NOTE — Telephone Encounter (Signed)
Thank you :)

## 2020-06-14 NOTE — Progress Notes (Signed)
Chief Complaint:   Isabella Larson is here to discuss her progress with her Isabella treatment plan along with follow-up of her Isabella related diagnoses. Isabella Larson is on the Category 4 Plan and states she is following her eating plan approximately 70% of the time. Isabella Larson states she is walking 30 minutes 2 times per week.  Today's visit was #: 20 Starting weight: 305 lbs Starting date: 04/18/2019 Today's weight: 276 lbs Today's date: 06/12/2020 Total lbs lost to date: 29 Total lbs lost since last in-office visit: 3  Interim History: Tanner is down 3 lbs and doing well overall. She is taking Qsymia and doing well with it.  Subjective:   Other iron deficiency anemia. Isabella Larson is taking niferex. She denies fatigue.   CBC Latest Ref Rng & Units 04/26/2020 08/23/2019  WBC 3.4 - 10.8 x10E3/uL - 6.2  Hemoglobin 11.1 - 15.9 g/dL - 89.3  Hematocrit 81.0 - 46.6 % 32.7(L) 37.5  Platelets 150 - 450 x10E3/uL - 292   Other specified hypothyroidism. Isabella Larson is taking Synthroid.   Lab Results  Component Value Date   TSH 5.300 (H) 04/26/2020   At risk for activity intolerance. Isabella Larson is at risk of exercise intolerance due to hypothyroidism, iron deficiency anemia, and Isabella.  Assessment/Plan:   Other iron deficiency anemia. Orders and follow up as documented in patient record. Prescription was given for Folate, iron polysaccharides (NIFEREX) 150 MG capsule BID #60 with 0 refills. Will check anemia panel today.  Counseling . Iron is essential for our bodies to make red blood cells.  Reasons that someone may be deficient include: an iron-deficient diet (more likely in those following vegan or vegetarian diets), women with heavy menses, patients with GI disorders or poor absorption, patients that have had bariatric surgery, frequent blood donors, patients with cancer, and patients with heart disease.   [name] An iron supplement has been recommended. This is found over-the-counter.    Isabella Larson foods include dark leafy greens, red and white meats, eggs, seafood, and beans.   . Certain foods and drinks prevent your body from absorbing iron properly. Avoid eating these foods in the same meal as iron-rich foods or with iron supplements. These foods include: coffee, black tea, and red wine; milk, dairy products, and foods that are high in calcium; beans and soybeans; whole grains.  . Constipation can be a side effect of iron supplementation. Increased water and fiber intake are helpful. Water goal: > 2 liters/day. Fiber goal: > 25 grams/day.   Other specified hypothyroidism. Patient with long-standing hypothyroidism, on levothyroxine therapy. She appears euthyroid. Orders and follow up as documented in patient record. Isabella Larson will continue Synthroid as directed.   Counseling . Good thyroid control is important for overall health. Supratherapeutic thyroid levels are dangerous and will not improve weight loss results. . The correct way to take levothyroxine is fasting, with water, separated by at least 30 minutes from breakfast, and separated by more than 4 hours from calcium, iron, multivitamins, acid reflux medications (PPIs).   At risk for activity intolerance. Isabella Larson was given approximately 15 minutes of exercise intolerance counseling today. She is 39 y.o. female and has risk factors exercise intolerance including Isabella. We discussed intensive lifestyle modifications today with an emphasis on specific weight loss instructions and strategies. Isabella Larson will slowly increase activity as tolerated.  Repetitive spaced learning was employed today to elicit superior memory formation and behavioral change.  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 Prescription was given for Phentermine-Topiramate (QSYMIA) 7.5-46 MG CP24 1 daily in the a.m. #30 with 0 refills.  Isabella Larson is currently in the  action stage of change. As such, her goal is to continue with weight loss efforts. She has agreed to the Category 4 Plan.   She will work on meal planning.  Exercise goals: All adults should avoid inactivity. Some physical activity is better than none, and adults who participate in any amount of physical activity gain some health benefits.  Behavioral modification strategies: increasing lean protein intake, decreasing simple carbohydrates, increasing vegetables, increasing water intake, decreasing eating out, no skipping meals, meal planning and cooking strategies, keeping healthy foods in the home and planning for success.  Isabella Larson has agreed to follow-up with our clinic in 3-4 weeks. She was informed of the importance of frequent follow-up visits to maximize her success with intensive lifestyle modifications for her multiple health conditions.   Isabella Larson was informed we would discuss her lab results at her next visit unless there is a critical issue that needs to be addressed sooner. Isabella Larson agreed to keep her next visit at the agreed upon time to discuss these results.  Objective:   Blood pressure 114/76, pulse 67, temperature (!) 97.5 F (36.4 C), temperature source Oral, height 5\' 8"  (1.727 m), weight 276 lb (125.2 kg), last menstrual period 06/11/2020, SpO2 100 %. Body mass index is 41.97 kg/m.  General: Cooperative, alert, well developed, in no acute distress. HEENT: Conjunctivae and lids unremarkable. Cardiovascular: Regular rhythm.  Lungs: Normal work of breathing. Neurologic: No focal deficits.   Lab Results  Component Value Date   CREATININE 0.67 04/26/2020   BUN 14 04/26/2020   NA 139 04/26/2020   K 4.7 04/26/2020   CL 104 04/26/2020   CO2 24 04/26/2020   Lab Results  Component Value Date   ALT 17 04/26/2020   AST 19 04/26/2020   ALKPHOS 114 04/26/2020   BILITOT <0.2 04/26/2020   Lab Results  Component Value Date   HGBA1C 6.3 (H) 04/26/2020   HGBA1C 6.0 (H)  12/20/2019   HGBA1C 6.2 (H) 08/23/2019   HGBA1C 5.8 (H) 04/18/2019   Lab Results  Component Value Date   INSULIN 14.8 04/26/2020   INSULIN 9.8 12/20/2019   INSULIN 15.4 08/23/2019   INSULIN 15.7 04/18/2019   Lab Results  Component Value Date   TSH 5.300 (H) 04/26/2020   Lab Results  Component Value Date   CHOL 171 04/26/2020   HDL 56 04/26/2020   LDLCALC 102 (H) 04/26/2020   TRIG 65 04/26/2020   Lab Results  Component Value Date   WBC 6.2 08/23/2019   HGB 12.3 08/23/2019   HCT 31.4 (L) 06/12/2020   MCV 84 08/23/2019   PLT 292 08/23/2019   Lab Results  Component Value Date   IRON 13 (L) 06/12/2020   TIBC 400 06/12/2020   FERRITIN 5 (L) 06/12/2020   Attestation Statements:   Reviewed by clinician on day of visit: allergies, medications, problem list, medical history, surgical history, family history, social history, and previous encounter notes.  06/14/2020, am acting as Fernanda Drum for Energy manager, DO   I have reviewed the above documentation for accuracy and completeness, and I agree with the above. [employer], DO

## 2020-06-20 ENCOUNTER — Other Ambulatory Visit: Payer: Self-pay

## 2020-06-20 ENCOUNTER — Ambulatory Visit (HOSPITAL_COMMUNITY)
Admission: RE | Admit: 2020-06-20 | Discharge: 2020-06-20 | Disposition: A | Discharge status: Home / Self Care | Payer: BC Managed Care – PPO | Source: Ambulatory Visit | Attending: Internal Medicine | Admitting: Internal Medicine

## 2020-06-20 DIAGNOSIS — D508 Other iron deficiency anemias: Secondary | ICD-10-CM | POA: Insufficient documentation

## 2020-06-20 MED ORDER — SODIUM CHLORIDE 0.9 % IV SOLN
INTRAVENOUS | Status: DC | PRN
  Administered 2020-06-20: 250 mL via INTRAVENOUS

## 2020-06-20 MED ORDER — SODIUM CHLORIDE 0.9 % IV SOLN
510.0000 mg | Freq: Once | INTRAVENOUS | Status: AC
  Administered 2020-06-20: 510 mg via INTRAVENOUS
  Filled 2020-06-20: qty 510

## 2020-06-20 NOTE — Discharge Instructions (Signed)

## 2020-06-20 NOTE — Addendum Note (Signed)
Addended by: Scarlett Presto on: 06/20/2020 08:17 AM   Modules accepted: Orders

## 2020-06-20 NOTE — Progress Notes (Signed)
Patient received IV Feraheme as ordered by Wyvonnia Dusky MD. Observed for at least 30 minutes post infusion.Tolerated well, vitals stable, discharge instructions given, verbalized understanding. Patient alert, oriented and ambulatory at the time of discharge.

## 2020-06-25 ENCOUNTER — Ambulatory Visit: Payer: BC Managed Care – PPO | Admitting: Psychology

## 2020-07-02 ENCOUNTER — Ambulatory Visit (INDEPENDENT_AMBULATORY_CARE_PROVIDER_SITE_OTHER): Payer: BC Managed Care – PPO | Admitting: Psychology

## 2020-07-02 DIAGNOSIS — F509 Eating disorder, unspecified: Secondary | ICD-10-CM

## 2020-07-03 ENCOUNTER — Ambulatory Visit (INDEPENDENT_AMBULATORY_CARE_PROVIDER_SITE_OTHER): Payer: BC Managed Care – PPO | Admitting: Bariatrics

## 2020-07-05 ENCOUNTER — Ambulatory Visit (INDEPENDENT_AMBULATORY_CARE_PROVIDER_SITE_OTHER): Payer: BC Managed Care – PPO | Admitting: Adult Health

## 2020-07-05 ENCOUNTER — Encounter (INDEPENDENT_AMBULATORY_CARE_PROVIDER_SITE_OTHER): Payer: Self-pay | Admitting: Adult Health

## 2020-07-05 ENCOUNTER — Other Ambulatory Visit: Payer: Self-pay

## 2020-07-05 VITALS — BP 106/70 | Temp 97.9°F | Ht 68.0 in | Wt 279.0 lb

## 2020-07-05 DIAGNOSIS — E538 Deficiency of other specified B group vitamins: Secondary | ICD-10-CM

## 2020-07-05 DIAGNOSIS — D508 Other iron deficiency anemias: Secondary | ICD-10-CM

## 2020-07-05 DIAGNOSIS — R7303 Prediabetes: Secondary | ICD-10-CM

## 2020-07-05 DIAGNOSIS — Z6841 Body Mass Index (BMI) 40.0 and over, adult: Secondary | ICD-10-CM

## 2020-07-09 NOTE — Progress Notes (Signed)
Chief Complaint:   OBESITY Isabella Larson is here to discuss her progress with her obesity treatment plan along with follow-up of her obesity related diagnoses. Isabella Larson is on the Category 4 Plan and states she is following her eating plan approximately 40% of the time. Isabella Larson states she is exercising 0 minutes 0 times per week.  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 Since then she has had iron deficiency anemia. She received IV iron infusion on 06/20/2020. She reports improvement with energy levels, however, she will need another infusion soon. 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   Subjective:   Other iron deficiency anemia. Isabella Larson received an iron infusion on 06/20/2020 and estimates a 50% reduction in fatigue. She will need another infusion to improve her levels.  CBC Latest Ref Rng & Units 06/12/2020 04/26/2020 08/23/2019  WBC 3.4 - 10.8 x10E3/uL - - 6.2  Hemoglobin 11.1 - 15.9 g/dL - - 85.6  Hematocrit 31.4 - 46.6 % 31.4(L) 32.7(L) 37.5  Platelets 150 - 450 x10E3/uL - - 292   Lab Results  Component Value Date   IRON 13 (L) 06/12/2020   TIBC 400 06/12/2020   FERRITIN 5 (L) 06/12/2020   Lab Results  Component Value Date   VITAMINB12 >2000 (H) 06/12/2020   B12 nutritional deficiency. 06/12/2020 B12 level was greater than 2000. Isabella Larson is on B12 supplement daily.  Lab Results  Component Value Date   VITAMINB12 >2000 (H) 06/12/2020   Prediabetes. Isabella Larson has a diagnosis of prediabetes based on her elevated HgA1c and was informed this puts her at greater risk of developing diabetes. She continues to work on diet and exercise to decrease her  risk of diabetes. She denies nausea or hypoglycemia. A1c has been steadily increasing the last year. She is not on metformin and denies polyphagia.  Lab Results  Component Value Date   HGBA1C 6.3 (H) 04/26/2020   Lab Results  Component Value Date   INSULIN 14.8 04/26/2020   INSULIN 9.8 12/20/2019   INSULIN 15.4 08/23/2019   INSULIN 15.7 04/18/2019   Assessment/Plan:   Other iron deficiency anemia. Orders and follow up as documented in patient record. Isabella Larson will follow-up with her PCP and discuss quarterly infusions to maintain optimal blood levels.  Counseling . Iron is essential for our bodies to make red blood cells.  Reasons that someone may be deficient include: an iron-deficient diet (more likely in those following vegan or vegetarian diets), women with heavy menses, patients with GI disorders or poor absorption, patients that have had bariatric surgery, frequent blood donors, patients with cancer, and patients with heart disease.   [name] An iron supplement has been recommended. This is found over-the-counter.  [name] foods include dark leafy greens, red and white meats, eggs, seafood, and beans.   . Certain foods and drinks prevent your body from absorbing iron properly. Avoid eating these foods in the same meal as iron-rich foods or with iron supplements. These foods include: coffee, black tea, and red wine; milk, dairy products, and foods that are high in calcium; beans and soybeans; whole grains.  . Constipation can be a side effect of iron supplementation. Increased water  and fiber intake are helpful. Water goal: > 2 liters/day. Fiber goal: > 25 grams/day.  B12 nutritional deficiency. The diagnosis was reviewed with the patient. Counseling provided today, see below. We will continue to monitor. Orders and follow up as documented in patient record. Isabella Larson will stop daily B12 supplementation for 1 week and then okay to resume every other day.  Counseling . The body needs  vitamin B12: to make red blood cells; to make DNA; and to help the nerves work properly so they can carry messages from the brain to the body.  . The main causes of vitamin B12 deficiency include dietary deficiency, digestive diseases, pernicious anemia, and having a surgery in which part of the stomach or small intestine is removed.  . Certain medicines can make it harder for the body to absorb vitamin B12. These medicines include: heartburn medications; some antibiotics; some medications used to treat diabetes, gout, and high cholesterol.  . In some cases, there are no symptoms of this condition. If the condition leads to anemia or nerve damage, various symptoms can occur, such as weakness or fatigue, shortness of breath, and numbness or tingling in your hands and feet.   . Treatment:  o May include taking vitamin B12 supplements.  o Avoid alcohol.  o Eat lots of healthy foods that contain vitamin B12: - Beef, pork, chicken, [place], and organ meats, such as liver.  - Seafood: This includes clams, rainbow trout, salmon, tuna, and haddock.  - Eggs.  - Cereal and dairy products that are fortified: This means that vitamin B12 has been added to the food.   Prediabetes. Isabella Larson will continue to work on weight loss, exercise, increasing protein, and decreasing simple carbohydrates to help decrease the risk of diabetes.   Class 3 severe obesity with serious comorbidity and body mass index (BMI) of 40.0 to 44.9 in adult, unspecified obesity type (HCC). Isabella Larson will continue Qsymia 7.5/46 mg daily - appetite well controlled.  Isabella Larson is currently in the action stage of change. As such, her goal is to continue with weight loss efforts. She has agreed to the Category 4 Plan.   Exercise goals: No exercise has been prescribed at this time.  Behavioral modification strategies: increasing lean protein intake, no skipping meals, meal planning and cooking strategies, ways to avoid night time snacking and  planning for success.  Isabella Larson has agreed to follow-up with our clinic in 3 weeks. She was informed of the importance of frequent follow-up visits to maximize her success with intensive lifestyle modifications for her multiple health conditions.   Objective:   Blood pressure 106/70, temperature 97.9 F (36.6 C), height 5\' 8"  (1.727 m), weight 279 lb (126.6 kg), last menstrual period 06/11/2020, SpO2 100 %. Body mass index is 42.42 kg/m.  General: Cooperative, alert, well developed, in no acute distress. HEENT: Conjunctivae and lids unremarkable. Cardiovascular: Regular rhythm.  Lungs: Normal work of breathing. Neurologic: No focal deficits.   Lab Results  Component Value Date   CREATININE 0.67 04/26/2020   BUN 14 04/26/2020   NA 139 04/26/2020   K 4.7 04/26/2020   CL 104 04/26/2020   CO2 24 04/26/2020   Lab Results  Component Value Date   ALT 17 04/26/2020   AST 19 04/26/2020   ALKPHOS 114 04/26/2020   BILITOT <0.2 04/26/2020   Lab Results  Component Value Date   HGBA1C 6.3 (H) 04/26/2020   HGBA1C 6.0 (H) 12/20/2019   HGBA1C 6.2 (H) 08/23/2019   HGBA1C 5.8 (H) 04/18/2019  Lab Results  Component Value Date   INSULIN 14.8 04/26/2020   INSULIN 9.8 12/20/2019   INSULIN 15.4 08/23/2019   INSULIN 15.7 04/18/2019   Lab Results  Component Value Date   TSH 5.300 (H) 04/26/2020   Lab Results  Component Value Date   CHOL 171 04/26/2020   HDL 56 04/26/2020   LDLCALC 102 (H) 04/26/2020   TRIG 65 04/26/2020   Lab Results  Component Value Date   WBC 6.2 08/23/2019   HGB 12.3 08/23/2019   HCT 31.4 (L) 06/12/2020   MCV 84 08/23/2019   PLT 292 08/23/2019   Lab Results  Component Value Date   IRON 13 (L) 06/12/2020   TIBC 400 06/12/2020   FERRITIN 5 (L) 06/12/2020   Attestation Statements:   Reviewed by clinician on day of visit: allergies, medications, problem list, medical history, surgical history, family history, social history, and previous encounter  notes.  Time spent on visit including pre-visit chart review and post-visit charting and care was 29 minutes.   I, Marianna Payment, am acting as Energy manager for [employer], NP-C   I have reviewed the above documentation for accuracy and completeness, and I agree with the above. -  Zendaya Groseclose d. Vanessa Kampf, NP-C

## 2020-07-13 ENCOUNTER — Other Ambulatory Visit (HOSPITAL_COMMUNITY): Payer: Self-pay | Admitting: *Deleted

## 2020-07-16 ENCOUNTER — Encounter (HOSPITAL_COMMUNITY): Payer: BC Managed Care – PPO

## 2020-07-26 ENCOUNTER — Other Ambulatory Visit: Payer: Self-pay

## 2020-07-26 ENCOUNTER — Ambulatory Visit (HOSPITAL_COMMUNITY)
Admission: RE | Admit: 2020-07-26 | Discharge: 2020-07-26 | Disposition: A | Discharge status: Home / Self Care | Payer: BC Managed Care – PPO | Source: Ambulatory Visit | Attending: Family Medicine | Admitting: Family Medicine

## 2020-07-26 DIAGNOSIS — D509 Iron deficiency anemia, unspecified: Secondary | ICD-10-CM | POA: Diagnosis not present

## 2020-07-26 MED ORDER — SODIUM CHLORIDE 0.9 % IV SOLN
510.0000 mg | Freq: Once | INTRAVENOUS | Status: AC
  Administered 2020-07-26: 510 mg via INTRAVENOUS
  Filled 2020-07-26: qty 17

## 2020-07-30 ENCOUNTER — Ambulatory Visit (INDEPENDENT_AMBULATORY_CARE_PROVIDER_SITE_OTHER): Payer: BC Managed Care – PPO | Admitting: Psychology

## 2020-07-30 DIAGNOSIS — F509 Eating disorder, unspecified: Secondary | ICD-10-CM | POA: Diagnosis not present

## 2020-08-02 ENCOUNTER — Ambulatory Visit (INDEPENDENT_AMBULATORY_CARE_PROVIDER_SITE_OTHER): Payer: BC Managed Care – PPO | Admitting: Bariatrics

## 2020-08-02 ENCOUNTER — Other Ambulatory Visit: Payer: Self-pay

## 2020-08-02 ENCOUNTER — Encounter (INDEPENDENT_AMBULATORY_CARE_PROVIDER_SITE_OTHER): Payer: Self-pay | Admitting: Bariatrics

## 2020-08-02 VITALS — BP 114/68 | HR 63 | Temp 98.2°F | Ht 68.0 in | Wt 275.0 lb

## 2020-08-02 DIAGNOSIS — Z9189 Other specified personal risk factors, not elsewhere classified: Secondary | ICD-10-CM | POA: Diagnosis not present

## 2020-08-02 DIAGNOSIS — E66813 Obesity, class 3: Secondary | ICD-10-CM

## 2020-08-02 DIAGNOSIS — D508 Other iron deficiency anemias: Secondary | ICD-10-CM

## 2020-08-02 DIAGNOSIS — Z6841 Body Mass Index (BMI) 40.0 and over, adult: Secondary | ICD-10-CM | POA: Diagnosis not present

## 2020-08-02 DIAGNOSIS — E038 Other specified hypothyroidism: Secondary | ICD-10-CM

## 2020-08-02 MED ORDER — QSYMIA 7.5-46 MG PO CP24: ORAL_CAPSULE | ORAL | 0 refills | Status: DC

## 2020-08-02 NOTE — Progress Notes (Signed)
Chief Complaint:   OBESITY Isabella Larson is here to discuss her progress with her obesity treatment plan along with follow-up of her obesity related diagnoses. Isabella Larson is on the Category 4 Plan and states she is following her eating plan approximately 40-50% of the time. Isabella Larson states she is exercising 0 minutes 0 times per week.  Today's visit was #: 22 Starting weight: 305 lbs Starting date: 04/18/2019 Today's weight: 275 lbs Today's date: 08/02/2020 Total lbs lost to date: 30 Total lbs lost since last in-office visit: 4  Interim History: Isabella Larson is down an additional 4 lbs. She is taking Qsymia and doing well.  Subjective:   Other specified hypothyroidism. Isabella Larson is taking Synthroid.   Lab Results  Component Value Date   TSH 5.300 (H) 04/26/2020   Other iron deficiency anemia. Isabella Larson has had iron infusions x2. She is not on an iron supplement.   CBC Latest Ref Rng & Units 06/12/2020 04/26/2020 08/23/2019  WBC 3.4 - 10.8 x10E3/uL - - 6.2  Hemoglobin 11.1 - 15.9 g/dL - - 79.7  Hematocrit 28.2 - 46.6 % 31.4(L) 32.7(L) 37.5  Platelets 150 - 450 x10E3/uL - - 292   Lab Results  Component Value Date   IRON 13 (L) 06/12/2020   TIBC 400 06/12/2020   FERRITIN 5 (L) 06/12/2020   Lab Results  Component Value Date   VITAMINB12 >2000 (H) 06/12/2020   At risk for activity intolerance. Isabella Larson is at risk of exercise intolerance due to anemia.  Assessment/Plan:   Other specified hypothyroidism. Patient with long-standing hypothyroidism, on levothyroxine therapy. She appears euthyroid. Orders and follow up as documented in patient record. Isabella Larson will continue Synthroid as directed.   Counseling . Good thyroid control is important for overall health. Supratherapeutic thyroid levels are dangerous and will not improve weight loss results. . The correct way to take levothyroxine is fasting, with water, separated by at least 30 minutes from breakfast, and separated by  more than 4 hours from calcium, iron, multivitamins, acid reflux medications (PPIs).   Other iron deficiency anemia. Orders and follow up as documented in patient record. Isabella Larson will follow-up with her PCP and Hematology if needed.  Counseling . Iron is essential for our bodies to make red blood cells.  Reasons that someone may be deficient include: an iron-deficient diet (more likely in those following vegan or vegetarian diets), women with heavy menses, patients with GI disorders or poor absorption, patients that have had bariatric surgery, frequent blood donors, patients with cancer, and patients with heart disease.   [name] An iron supplement has been recommended. This is found over-the-counter.  [name] foods include dark leafy greens, red and white meats, eggs, seafood, and beans.   . Certain foods and drinks prevent your body from absorbing iron properly. Avoid eating these foods in the same meal as iron-rich foods or with iron supplements. These foods include: coffee, black tea, and red wine; milk, dairy products, and foods that are high in calcium; beans and soybeans; whole grains.  . Constipation can be a side effect of iron supplementation. Increased water and fiber intake are helpful. Water goal: > 2 liters/day. Fiber goal: > 25 grams/day.  At risk for activity intolerance. Isabella Larson was given approximately 15 minutes of exercise intolerance counseling today. She is 39 y.o. female and has risk factors exercise intolerance including obesity. We discussed intensive lifestyle modifications today with an emphasis on specific weight loss instructions and strategies. Isabella Larson will slowly increase activity as tolerated.  Repetitive spaced learning was employed today to elicit superior memory formation and behavioral change.  Class 3 severe obesity with serious comorbidity and body mass index (BMI) of 40.0 to 44.9 in adult, unspecified obesity type (HCC). Prescription was given for  Phentermine-Topiramate (QSYMIA) 7.5-46 MG CP24 1 daily #30 with 0 refills.  Isabella Larson is currently in the action stage of change. As such, her goal is to continue with weight loss efforts. She has agreed to the Category 4 Plan.   She will work on meal planning, intentional eating, and decreasing snacks.  Exercise goals: All adults should avoid inactivity. Some physical activity is better than none, and adults who participate in any amount of physical activity gain some health benefits.  Behavioral modification strategies: increasing lean protein intake, decreasing simple carbohydrates, increasing vegetables, increasing water intake, decreasing eating out, no skipping meals, meal planning and cooking strategies, keeping healthy foods in the home, holiday eating strategies , celebration eating strategies, avoiding temptations and planning for success.  Isabella Larson has agreed to follow-up with our clinic in 3 weeks. She was informed of the importance of frequent follow-up visits to maximize her success with intensive lifestyle modifications for her multiple health conditions.   Objective:   Blood pressure 114/68, pulse 63, temperature 98.2 F (36.8 C), height 5\' 8"  (1.727 m), weight 275 lb (124.7 kg), last menstrual period 06/14/2020, SpO2 99 %. Body mass index is 41.81 kg/m.  General: Cooperative, alert, well developed, in no acute distress. HEENT: Conjunctivae and lids unremarkable. Cardiovascular: Regular rhythm.  Lungs: Normal work of breathing. Neurologic: No focal deficits.   Lab Results  Component Value Date   CREATININE 0.67 04/26/2020   BUN 14 04/26/2020   NA 139 04/26/2020   K 4.7 04/26/2020   CL 104 04/26/2020   CO2 24 04/26/2020   Lab Results  Component Value Date   ALT 17 04/26/2020   AST 19 04/26/2020   ALKPHOS 114 04/26/2020   BILITOT <0.2 04/26/2020   Lab Results  Component Value Date   HGBA1C 6.3 (H) 04/26/2020   HGBA1C 6.0 (H) 12/20/2019   HGBA1C 6.2 (H)  08/23/2019   HGBA1C 5.8 (H) 04/18/2019   Lab Results  Component Value Date   INSULIN 14.8 04/26/2020   INSULIN 9.8 12/20/2019   INSULIN 15.4 08/23/2019   INSULIN 15.7 04/18/2019   Lab Results  Component Value Date   TSH 5.300 (H) 04/26/2020   Lab Results  Component Value Date   CHOL 171 04/26/2020   HDL 56 04/26/2020   LDLCALC 102 (H) 04/26/2020   TRIG 65 04/26/2020   Lab Results  Component Value Date   WBC 6.2 08/23/2019   HGB 12.3 08/23/2019   HCT 31.4 (L) 06/12/2020   MCV 84 08/23/2019   PLT 292 08/23/2019   Lab Results  Component Value Date   IRON 13 (L) 06/12/2020   TIBC 400 06/12/2020   FERRITIN 5 (L) 06/12/2020   Attestation Statements:   Reviewed by clinician on day of visit: allergies, medications, problem list, medical history, surgical history, family history, social history, and previous encounter notes.  06/14/2020, am acting as Fernanda Drum for Energy manager, DO   I have reviewed the above documentation for accuracy and completeness, and I agree with the above. [employer], DO

## 2020-08-06 ENCOUNTER — Encounter (INDEPENDENT_AMBULATORY_CARE_PROVIDER_SITE_OTHER): Payer: Self-pay | Admitting: Bariatrics

## 2020-08-23 ENCOUNTER — Other Ambulatory Visit: Payer: Self-pay

## 2020-08-23 ENCOUNTER — Ambulatory Visit (INDEPENDENT_AMBULATORY_CARE_PROVIDER_SITE_OTHER): Payer: BC Managed Care – PPO | Admitting: Bariatrics

## 2020-08-23 VITALS — BP 107/71 | HR 66 | Temp 98.0°F | Ht 68.0 in | Wt 276.0 lb

## 2020-08-23 DIAGNOSIS — D508 Other iron deficiency anemias: Secondary | ICD-10-CM

## 2020-08-23 DIAGNOSIS — E038 Other specified hypothyroidism: Secondary | ICD-10-CM

## 2020-08-23 DIAGNOSIS — Z9189 Other specified personal risk factors, not elsewhere classified: Secondary | ICD-10-CM

## 2020-08-23 DIAGNOSIS — E559 Vitamin D deficiency, unspecified: Secondary | ICD-10-CM

## 2020-08-23 DIAGNOSIS — Z6841 Body Mass Index (BMI) 40.0 and over, adult: Secondary | ICD-10-CM

## 2020-08-23 DIAGNOSIS — R7303 Prediabetes: Secondary | ICD-10-CM | POA: Diagnosis not present

## 2020-08-24 LAB — ANEMIA PANEL
Ferritin: 49 ng/mL (ref 15–150)
Folate, Hemolysate: 310 ng/mL
Folate, RBC: 797 ng/mL (ref >498)
Hematocrit: 38.9 % (ref 34.0–46.6)
Iron Saturation: 15 % (ref 15–55)
Iron: 45 ug/dL (ref 27–159)
Retic Ct Pct: 1.2 % (ref 0.6–2.6)
Total Iron Binding Capacity: 299 ug/dL (ref 250–450)
UIBC: 254 ug/dL (ref 131–425)
Vitamin B-12: >2000 pg/mL — ABNORMAL HIGH (ref 232–1245)

## 2020-08-24 LAB — TSH+T4F+T3FREE
Free T4: 1.32 ng/dL (ref 0.82–1.77)
T3, Free: 2.4 pg/mL (ref 2.0–4.4)
TSH: 6.29 u[IU]/mL — ABNORMAL HIGH (ref 0.450–4.500)

## 2020-08-24 LAB — VITAMIN D 25 HYDROXY (VIT D DEFICIENCY, FRACTURES): Vit D, 25-Hydroxy: 30.3 ng/mL (ref 30.0–100.0)

## 2020-08-27 NOTE — Progress Notes (Signed)
Chief Complaint:   OBESITY Isabella Larson is here to discuss her progress with her obesity treatment plan along with follow-up of her obesity related diagnoses. Isabella Larson is on the Category 4 Plan and states she is following her eating plan approximately 60% of the time. Isabella Larson states she is exercising 0 minutes 0 times per week.  Today's visit was #: 23 Starting weight: 305 lbs Starting date: 04/18/2019 Today's weight: 276 lbs Today's date: 08/23/2020 Total lbs lost to date: 29 Total lbs lost since last in-office visit: 0  Interim History: Isabella Larson is up 1 lb over the holiday season. She is taking Qsymia.  Subjective:   Other specified hypothyroidism.  Isabella Larson is taking Synthroid.  Prediabetes. Isabella Larson has a diagnosis of prediabetes based on her elevated HgA1c and was informed this puts her at greater risk of developing diabetes. She continues to work on diet and exercise to decrease her risk of diabetes. She denies nausea or hypoglycemia. Isabella Larson is on no medication.  Lab Results  Component Value Date   HGBA1C 6.3 (H) 04/26/2020   Lab Results  Component Value Date   INSULIN 14.8 04/26/2020   INSULIN 9.8 12/20/2019   INSULIN 15.4 08/23/2019   INSULIN 15.7 04/18/2019   Other iron deficiency anemia. Isabella Larson reports having had an iron infusion several weeks ago.   CBC Latest Ref Rng & Units 08/23/2020 06/12/2020 04/26/2020  WBC 3.4 - 10.8 x10E3/uL - - -  Hemoglobin 11.1 - 15.9 g/dL - - -  Hematocrit 22.2 - 46.6 % 38.9 31.4(L) 32.7(L)  Platelets 150 - 450 x10E3/uL - - -   Vitamin D deficiency. Isabella Larson is taking a multivitamin.   Ref. Range 04/26/2020 11:55  Vitamin D, 25-Hydroxy Latest Ref Range: 30.0 - 100.0 ng/mL 39.8   At risk for activity intolerance.   Assessment/Plan:   Other specified hypothyroidism. Patient with long-standing hypothyroidism, on levothyroxine therapy. She appears euthyroid. Orders and follow up as documented in patient record. Isabella Larson  will continue Synthroid as directed. Thyroid panel will be checked today.  Counseling . Good thyroid control is important for overall health. Supratherapeutic thyroid levels are dangerous and will not improve weight loss results. . The correct way to take levothyroxine is fasting, with water, separated by at least 30 minutes from breakfast, and separated by more than 4 hours from calcium, iron, multivitamins, acid reflux medications (PPIs).     Prediabetes. Isabella Larson will continue to work on weight loss, exercise, and decreasing simple carbohydrates to help decrease the risk of diabetes.   Other iron deficiency anemia. Orders and follow up as documented in patient record. Anemia panel will be checked today.  Counseling . Iron is essential for our bodies to make red blood cells.  Reasons that someone may be deficient include: an iron-deficient diet (more likely in those following vegan or vegetarian diets), women with heavy menses, patients with GI disorders or poor absorption, patients that have had bariatric surgery, frequent blood donors, patients with cancer, and patients with heart disease.   Isabella Larson Kitchen An iron supplement has been recommended. This is found over-the-counter.  Isabella Larson foods include dark leafy greens, red and white meats, eggs, seafood, and beans.   . Certain foods and drinks prevent your body from absorbing iron properly. Avoid eating these foods in the same meal as iron-rich foods or with iron supplements. These foods include: coffee, black tea, and red wine; milk, dairy products, and foods that are high in calcium; beans and soybeans; whole grains.  Isabella Larson Kitchen  Constipation can be a side effect of iron supplementation. Increased water and fiber intake are helpful. Water goal: > 2 liters/day. Fiber goal: > 25 grams/day.   Vitamin D deficiency. Low Vitamin D level contributes to fatigue and are associated with obesity, breast, and colon cancer. She agrees to continue to take a multivitamin as  directed and VITAMIN D 25 Hydroxy (Vit-D Deficiency, Fractures) level will be checked today.   At risk for activity intolerance.  Class 3 severe obesity with serious comorbidity and body mass index (BMI) of 40.0 to 44.9 in adult, unspecified obesity type (HCC).  Isabella Larson is currently in the action stage of change. As such, her goal is to continue with weight loss efforts. She has agreed to the Category 4 Plan.   She will work on meal planning.  Exercise goals: Isabella Larson will consider going to "[employer] modification strategies: increasing lean protein intake, decreasing simple carbohydrates, increasing vegetables, increasing water intake, decreasing eating out, no skipping meals, meal planning and cooking strategies, keeping healthy foods in the home and planning for success.  Isabella Larson has agreed to follow-up with our clinic in 3 weeks. She was informed of the importance of frequent follow-up visits to maximize her success with intensive lifestyle modifications for her multiple health conditions.   Isabella Larson was informed we would discuss her lab results at her next visit unless there is a critical issue that needs to be addressed sooner. Isabella Larson agreed to keep her next visit at the agreed upon time to discuss these results.  Objective:   Blood pressure 107/71, pulse 66, temperature 98 F (36.7 C), height 5\' 8"  (1.727 m), weight 276 lb (125.2 kg), SpO2 99 %. Body mass index is 41.97 kg/m.  General: Cooperative, alert, well developed, in no acute distress. HEENT: Conjunctivae and lids unremarkable. Cardiovascular: Regular rhythm.  Lungs: Normal work of breathing. Neurologic: No focal deficits.   Lab Results  Component Value Date   CREATININE 0.67 04/26/2020   BUN 14 04/26/2020   NA 139 04/26/2020   K 4.7 04/26/2020   CL 104 04/26/2020   CO2 24 04/26/2020   Lab Results  Component Value Date   ALT 17 04/26/2020   AST 19 04/26/2020   ALKPHOS 114 04/26/2020    BILITOT <0.2 04/26/2020   Lab Results  Component Value Date   HGBA1C 6.3 (H) 04/26/2020   HGBA1C 6.0 (H) 12/20/2019   HGBA1C 6.2 (H) 08/23/2019   HGBA1C 5.8 (H) 04/18/2019   Lab Results  Component Value Date   INSULIN 14.8 04/26/2020   INSULIN 9.8 12/20/2019   INSULIN 15.4 08/23/2019   INSULIN 15.7 04/18/2019   Lab Results  Component Value Date   CHOL 171 04/26/2020   HDL 56 04/26/2020   LDLCALC 102 (H) 04/26/2020   TRIG 65 04/26/2020   Lab Results  Component Value Date   WBC 6.2 08/23/2019   HGB 12.3 08/23/2019   HCT 38.9 08/23/2020   MCV 84 08/23/2019   PLT 292 08/23/2019   Attestation Statements:   Reviewed by clinician on day of visit: allergies, medications, problem list, medical history, surgical history, family history, social history, and previous encounter notes.   14/09/2018, am acting as Fernanda Drum for Energy manager, DO   I have reviewed the above documentation for accuracy and completeness, and I agree with the above. [employer], DO

## 2020-09-12 ENCOUNTER — Encounter (INDEPENDENT_AMBULATORY_CARE_PROVIDER_SITE_OTHER): Payer: Self-pay | Admitting: Bariatrics

## 2020-09-12 ENCOUNTER — Other Ambulatory Visit: Payer: Self-pay

## 2020-09-12 ENCOUNTER — Ambulatory Visit (INDEPENDENT_AMBULATORY_CARE_PROVIDER_SITE_OTHER): Payer: BC Managed Care – PPO | Admitting: Bariatrics

## 2020-09-12 VITALS — BP 120/72 | HR 69 | Temp 97.5°F | Ht 68.0 in | Wt 277.0 lb

## 2020-09-12 DIAGNOSIS — Z9189 Other specified personal risk factors, not elsewhere classified: Secondary | ICD-10-CM

## 2020-09-12 DIAGNOSIS — Z9884 Bariatric surgery status: Secondary | ICD-10-CM

## 2020-09-12 DIAGNOSIS — E038 Other specified hypothyroidism: Secondary | ICD-10-CM

## 2020-09-12 DIAGNOSIS — R7303 Prediabetes: Secondary | ICD-10-CM

## 2020-09-12 DIAGNOSIS — Z6841 Body Mass Index (BMI) 40.0 and over, adult: Secondary | ICD-10-CM

## 2020-09-12 NOTE — Progress Notes (Signed)
Chief Complaint:   OBESITY Isabella Larson is here to discuss her progress with her obesity treatment plan along with follow-up of her obesity related diagnoses. Isabella Larson is on the Category 4 Plan and states she is following her eating plan approximately 30% of the time. Isabella Larson states she is exercising 0 minutes 0 times per week.  Today's visit was #: 24 Starting weight: 305 lbs Starting date: 04/18/2019 Today's weight: 277 lbs Today's date: 09/12/2020 Total lbs lost to date: 28 Total lbs lost since last in-office visit: 0  Interim History: Isabella Larson is up 1 lb, but has done well overall. She is taking Qsymia. "Things have been busy" per the patient.  Subjective:   Prediabetes. Isabella Larson has a diagnosis of prediabetes based on her elevated HgA1c and was informed this puts her at greater risk of developing diabetes. She continues to work on diet and exercise to decrease her risk of diabetes. She denies nausea or hypoglycemia. Isabella Larson is on no medication.  Lab Results  Component Value Date   HGBA1C 6.3 (H) 04/26/2020   Lab Results  Component Value Date   INSULIN 14.8 04/26/2020   INSULIN 9.8 12/20/2019   INSULIN 15.4 08/23/2019   INSULIN 15.7 04/18/2019   H/O gastric bypass. Jackelin still has some restriction. She is taking a multivitamin.  Other specified hypothyroidism. Isabella Larson has an elevated TSH. She is taking levothyroxine 200 mcg.   Lab Results  Component Value Date   TSH 6.290 (H) 08/23/2020    At risk for activity intolerance. [name] is at risk of exercise intolerance due to increased fatigue and hypothyroidism.  Assessment/Plan:   Prediabetes. Isabella Larson will continue to work on weight loss, increasing activity, and decreasing simple carbohydrates and sweets to help decrease the risk of diabetes.   H/O gastric bypass. Isabella Larson will continue taking her multivitamin and will eat small, frequent meals.  Other specified hypothyroidism. Patient with  long-standing hypothyroidism, on levothyroxine therapy. She appears euthyroid. Orders and follow up as documented in patient record. Isabella Larson will continue levothyroxine 200 mcg and add 1/4 tablet (50 mcg) daily. Will recheck in 6 weeks.  Counseling . Good thyroid control is important for overall health. Supratherapeutic thyroid levels are dangerous and will not improve weight loss results. . The correct way to take levothyroxine is fasting, with water, separated by at least 30 minutes from breakfast, and separated by more than 4 hours from calcium, iron, multivitamins, acid reflux medications (PPIs).   At risk for activity intolerance. Isabella Larson was given approximately 15 minutes of exercise intolerance counseling today. She is 39 y.o. female and has risk factors exercise intolerance including obesity. We discussed intensive lifestyle modifications today with an emphasis on specific weight loss instructions and strategies. Isabella Larson will slowly increase activity as tolerated.  Repetitive spaced learning was employed today to elicit superior memory formation and behavioral change.  Class 3 severe obesity with serious comorbidity and body mass index (BMI) of 40.0 to 44.9 in adult, unspecified obesity type (HCC).  Isabella Larson is currently in the action stage of change. As such, her goal is to continue with weight loss efforts. She has agreed to the Category 4 Plan.   She will be more adherent to the plan, will continue to keep water and protein intake high, and will work on meal planning.  We reviewed with the patient labs from 08/23/2020 including iron, Vitamin D, and B12.  Exercise goals: All adults should avoid inactivity. Some physical activity is better than none, and adults who  participate in any amount of physical activity gain some health benefits.  Behavioral modification strategies: increasing lean protein intake, decreasing simple carbohydrates, increasing vegetables, increasing water intake,  decreasing eating out, no skipping meals, meal planning and cooking strategies, keeping healthy foods in the home and planning for success.  Isabella Larson has agreed to follow-up with our clinic in 3 weeks. She was informed of the importance of frequent follow-up visits to maximize her success with intensive lifestyle modifications for her multiple health conditions.   Objective:   Blood pressure 120/72, pulse 69, temperature (!) 97.5 F (36.4 C), temperature source Oral, height 5\' 8"  (1.727 m), weight 277 lb (125.6 kg), SpO2 99 %. Body mass index is 42.12 kg/m.  General: Cooperative, alert, well developed, in no acute distress. HEENT: Conjunctivae and lids unremarkable. Cardiovascular: Regular rhythm.  Lungs: Normal work of breathing. Neurologic: No focal deficits.   Lab Results  Component Value Date   CREATININE 0.67 04/26/2020   BUN 14 04/26/2020   NA 139 04/26/2020   K 4.7 04/26/2020   CL 104 04/26/2020   CO2 24 04/26/2020   Lab Results  Component Value Date   ALT 17 04/26/2020   AST 19 04/26/2020   ALKPHOS 114 04/26/2020   BILITOT <0.2 04/26/2020   Lab Results  Component Value Date   HGBA1C 6.3 (H) 04/26/2020   HGBA1C 6.0 (H) 12/20/2019   HGBA1C 6.2 (H) 08/23/2019   HGBA1C 5.8 (H) 04/18/2019   Lab Results  Component Value Date   INSULIN 14.8 04/26/2020   INSULIN 9.8 12/20/2019   INSULIN 15.4 08/23/2019   INSULIN 15.7 04/18/2019   Lab Results  Component Value Date   TSH 6.290 (H) 08/23/2020   Lab Results  Component Value Date   CHOL 171 04/26/2020   HDL 56 04/26/2020   LDLCALC 102 (H) 04/26/2020   TRIG 65 04/26/2020   Lab Results  Component Value Date   WBC 6.2 08/23/2019   HGB 12.3 08/23/2019   HCT 38.9 08/23/2020   MCV 84 08/23/2019   PLT 292 08/23/2019   Lab Results  Component Value Date   IRON 45 08/23/2020   TIBC 299 08/23/2020   FERRITIN 49 08/23/2020   Attestation Statements:   Reviewed by clinician on day of visit: allergies,  medications, problem list, medical history, surgical history, family history, social history, and previous encounter notes.  14/10/2019, am acting as Fernanda Drum for Energy manager, DO   I have reviewed the above documentation for accuracy and completeness, and I agree with the above. [employer], DO

## 2020-10-04 ENCOUNTER — Encounter (INDEPENDENT_AMBULATORY_CARE_PROVIDER_SITE_OTHER): Payer: Self-pay

## 2020-10-08 ENCOUNTER — Telehealth (INDEPENDENT_AMBULATORY_CARE_PROVIDER_SITE_OTHER): Payer: BC Managed Care – PPO | Admitting: Bariatrics

## 2020-10-08 ENCOUNTER — Encounter (INDEPENDENT_AMBULATORY_CARE_PROVIDER_SITE_OTHER): Payer: Self-pay | Admitting: Bariatrics

## 2020-10-08 DIAGNOSIS — D508 Other iron deficiency anemias: Secondary | ICD-10-CM

## 2020-10-08 DIAGNOSIS — Z6841 Body Mass Index (BMI) 40.0 and over, adult: Secondary | ICD-10-CM

## 2020-10-08 DIAGNOSIS — E038 Other specified hypothyroidism: Secondary | ICD-10-CM

## 2020-10-10 NOTE — Progress Notes (Unsigned)
TeleHealth Visit:  Due to the COVID-19 pandemic, this visit was completed with telemedicine (audio/video) technology to reduce patient and provider exposure as well as to preserve personal protective equipment.   Isabella Larson has verbally consented to this TeleHealth visit. The patient is located at home, the provider is located at the [facility] office. The participants in this visit include the listed provider and patient. The visit was conducted today via MyChart video.  Chief Complaint: OBESITY Isabella Larson is here to discuss her progress with her obesity treatment plan along with follow-up of her obesity related diagnoses. Isabella Larson is on the Category 4 Plan and states she is following her eating plan approximately 70% of the time. Isabella Larson states she is not exercising regularly at this time.  Today's visit was #: 25 Starting weight: 305 lbs Starting date: 04/18/2019  Interim History: Isabella Larson states that her weight is the same.  She is still taking Qsymia and she states that it has made a big difference in her appetite.  Subjective:   1. Other iron deficiency anemia Isabella Larson has had iron infusions x2.  CBC Latest Ref Rng & Units 08/23/2020 06/12/2020 04/26/2020  WBC 3.4 - 10.8 x10E3/uL - - -  Hemoglobin 11.1 - 15.9 g/dL - - -  Hematocrit 22.2 - 46.6 % 38.9 31.4(L) 32.7(L)  Platelets 150 - 450 x10E3/uL - - -   Lab Results  Component Value Date   IRON 45 08/23/2020   TIBC 299 08/23/2020   FERRITIN 49 08/23/2020   Lab Results  Component Value Date   VITAMINB12 >2000 (H) 08/23/2020   2. Other specified hypothyroidism She is taking Synthroid 200 mcg daily.  Lab Results  Component Value Date   TSH 6.290 (H) 08/23/2020   Assessment/Plan:   1. Other iron deficiency anemia Follow-up with PCP.  Will follow CBC over time.  Counseling . Iron is essential for our bodies to make red blood cells.  Reasons that someone may be deficient include: an iron-deficient diet  (more likely in those following vegan or vegetarian diets), women with heavy menses, patients with GI disorders or poor absorption, patients that have had bariatric surgery, frequent blood donors, patients with cancer, and patients with heart disease.   [name] foods include dark leafy greens, red and white meats, eggs, seafood, and beans.   . Certain foods and drinks prevent your body from absorbing iron properly. Avoid eating these foods in the same meal as iron-rich foods or with iron supplements. These foods include: coffee, black tea, and red wine; milk, dairy products, and foods that are high in calcium; beans and soybeans; whole grains.  . Constipation can be a side effect of iron supplementation. Increased water and fiber intake are helpful. Water goal: > 2 liters/day. Fiber goal: > 25 grams/day.  2. Other specified hypothyroidism Patient with long-standing hypothyroidism, on levothyroxine therapy. She appears euthyroid. Continue medication.  Check thyroid panel in the near future.  Counseling . Good thyroid control is important for overall health. Supratherapeutic thyroid levels are dangerous and will not improve weight loss results. . The correct way to take levothyroxine is fasting, with water, separated by at least 30 minutes from breakfast, and separated by more than 4 hours from calcium, iron, multivitamins, acid reflux medications (PPIs).   3. Class 3 severe obesity with serious comorbidity and body mass index (BMI) of 40.0 to 44.9 in adult, unspecified obesity type (HCC)  Isabella Larson is currently in the action stage of change. As such, her goal  is to continue with weight loss efforts. She has agreed to the Category 4 Plan.   She will work on adhering closely to the plan, meal planning, and will continue Qsymia.  Exercise goals: All adults should avoid inactivity. Some physical activity is better than none, and adults who participate in any amount of physical activity gain some  health benefits.  Behavioral modification strategies: increasing lean protein intake, decreasing simple carbohydrates, increasing vegetables, increasing water intake and planning for success.  [name] has agreed to follow-up with our clinic in 2 weeks. She was informed of the importance of frequent follow-up visits to maximize her success with intensive lifestyle modifications for her multiple health conditions.  Objective:   VITALS: Per patient if applicable, see vitals. GENERAL: Alert and in no acute distress. CARDIOPULMONARY: No increased WOB. Speaking in clear sentences.  PSYCH: Pleasant and cooperative. Speech normal rate and rhythm. Affect is appropriate. Insight and judgement are appropriate. Attention is focused, linear, and appropriate.  NEURO: Oriented as arrived to appointment on time with no prompting.   Lab Results  Component Value Date   CREATININE 0.67 04/26/2020   BUN 14 04/26/2020   NA 139 04/26/2020   K 4.7 04/26/2020   CL 104 04/26/2020   CO2 24 04/26/2020   Lab Results  Component Value Date   ALT 17 04/26/2020   AST 19 04/26/2020   ALKPHOS 114 04/26/2020   BILITOT <0.2 04/26/2020   Lab Results  Component Value Date   HGBA1C 6.3 (H) 04/26/2020   HGBA1C 6.0 (H) 12/20/2019   HGBA1C 6.2 (H) 08/23/2019   HGBA1C 5.8 (H) 04/18/2019   Lab Results  Component Value Date   INSULIN 14.8 04/26/2020   INSULIN 9.8 12/20/2019   INSULIN 15.4 08/23/2019   INSULIN 15.7 04/18/2019   Lab Results  Component Value Date   TSH 6.290 (H) 08/23/2020   Lab Results  Component Value Date   CHOL 171 04/26/2020   HDL 56 04/26/2020   LDLCALC 102 (H) 04/26/2020   TRIG 65 04/26/2020   Lab Results  Component Value Date   WBC 6.2 08/23/2019   HGB 12.3 08/23/2019   HCT 38.9 08/23/2020   MCV 84 08/23/2019   PLT 292 08/23/2019   Lab Results  Component Value Date   IRON 45 08/23/2020   TIBC 299 08/23/2020   FERRITIN 49 08/23/2020   Attestation Statements:   Reviewed  by clinician on day of visit: allergies, medications, problem list, medical history, surgical history, family history, social history, and previous encounter notes.  I, Insurance claims handler, CMA, am acting as Energy manager for [employer], DO  I have reviewed the above documentation for accuracy and completeness, and I agree with the above. Corinna Capra, DO

## 2020-10-30 ENCOUNTER — Ambulatory Visit (INDEPENDENT_AMBULATORY_CARE_PROVIDER_SITE_OTHER): Payer: BC Managed Care – PPO | Admitting: Bariatrics

## 2020-10-30 ENCOUNTER — Encounter (INDEPENDENT_AMBULATORY_CARE_PROVIDER_SITE_OTHER): Payer: Self-pay | Admitting: Bariatrics

## 2020-10-30 ENCOUNTER — Other Ambulatory Visit: Payer: Self-pay

## 2020-10-30 VITALS — BP 123/80 | HR 70 | Temp 97.7°F | Ht 68.0 in

## 2020-10-30 DIAGNOSIS — E66813 Obesity, class 3: Secondary | ICD-10-CM

## 2020-10-30 DIAGNOSIS — Z6841 Body Mass Index (BMI) 40.0 and over, adult: Secondary | ICD-10-CM

## 2020-10-30 DIAGNOSIS — E038 Other specified hypothyroidism: Secondary | ICD-10-CM | POA: Diagnosis not present

## 2020-10-30 DIAGNOSIS — Z9884 Bariatric surgery status: Secondary | ICD-10-CM | POA: Diagnosis not present

## 2020-10-30 DIAGNOSIS — R7303 Prediabetes: Secondary | ICD-10-CM

## 2020-10-30 DIAGNOSIS — Z9189 Other specified personal risk factors, not elsewhere classified: Secondary | ICD-10-CM

## 2020-10-30 MED ORDER — QSYMIA 7.5-46 MG PO CP24: ORAL_CAPSULE | ORAL | 0 refills | Status: DC

## 2020-10-31 ENCOUNTER — Telehealth (INDEPENDENT_AMBULATORY_CARE_PROVIDER_SITE_OTHER): Payer: Self-pay | Admitting: Bariatrics

## 2020-10-31 LAB — COMPREHENSIVE METABOLIC PANEL
ALT: 15 IU/L (ref 0–32)
AST: 16 IU/L (ref 0–40)
Albumin/Globulin Ratio: 1.6 (ref 1.2–2.2)
Albumin: 3.9 g/dL (ref 3.8–4.8)
Alkaline Phosphatase: 116 IU/L (ref 44–121)
BUN/Creatinine Ratio: 25 — ABNORMAL HIGH (ref 9–23)
BUN: 16 mg/dL (ref 6–20)
Bilirubin Total: 0.2 mg/dL (ref 0.0–1.2)
CO2: 22 mmol/L (ref 20–29)
Calcium: 8.9 mg/dL (ref 8.7–10.2)
Chloride: 108 mmol/L — ABNORMAL HIGH (ref 96–106)
Creatinine, Ser: 0.63 mg/dL (ref 0.57–1.00)
GFR calc Af Amer: 132 mL/min/{1.73_m2} (ref >59)
GFR calc non Af Amer: 114 mL/min/{1.73_m2} (ref >59)
Globulin, Total: 2.5 g/dL (ref 1.5–4.5)
Glucose: 100 mg/dL — ABNORMAL HIGH (ref 65–99)
Potassium: 4.5 mmol/L (ref 3.5–5.2)
Sodium: 142 mmol/L (ref 134–144)
Total Protein: 6.4 g/dL (ref 6.0–8.5)

## 2020-10-31 LAB — INSULIN, RANDOM: INSULIN: 13.1 u[IU]/mL (ref 2.6–24.9)

## 2020-10-31 LAB — HEMOGLOBIN A1C
Est. average glucose Bld gHb Est-mCnc: 134 mg/dL
Hgb A1c MFr Bld: 6.3 % — ABNORMAL HIGH (ref 4.8–5.6)

## 2020-10-31 LAB — TSH+T4F+T3FREE
Free T4: 2.92 ng/dL — ABNORMAL HIGH (ref 0.82–1.77)
T3, Free: 4.4 pg/mL (ref 2.0–4.4)
TSH: 0.019 u[IU]/mL — ABNORMAL LOW (ref 0.450–4.500)

## 2020-10-31 NOTE — Progress Notes (Signed)
Left msg for pt  to c/b re: labs

## 2020-10-31 NOTE — Telephone Encounter (Signed)
Patient is returning Lisa's call from earlier today.

## 2020-11-01 ENCOUNTER — Encounter (INDEPENDENT_AMBULATORY_CARE_PROVIDER_SITE_OTHER): Payer: Self-pay | Admitting: Bariatrics

## 2020-11-01 ENCOUNTER — Encounter (INDEPENDENT_AMBULATORY_CARE_PROVIDER_SITE_OTHER): Payer: Self-pay

## 2020-11-01 NOTE — Progress Notes (Signed)
Pt advised of lab. Pt reports her dosage is 175 mcg.  She takes 1/2 pill po a.c. qam.

## 2020-11-01 NOTE — Progress Notes (Signed)
Chief Complaint:   OBESITY Isabella Larson is here to discuss her progress with her obesity treatment plan along with follow-up of her obesity related diagnoses. Isabella Larson is on the Category 4 Plan and states she is following her eating plan approximately 30-40% of the time. Isabella Larson states she is is not exercising regularly at this time.  Today's visit was #: 26 Starting weight: 305 lbs Starting date: 04/18/2019 Today's weight: 279 lbs Today's date: 10/30/2020 Total lbs lost to date: 26 lbs Total lbs lost since last in-office visit: 0  Interim History: Isabella Larson is up 2 pounds since her last visit, but has done well overall.  She has had some nasal congestion, but has been eating more soup.  Subjective:   1. Other specified hypothyroidism Isabella Larson is taking Synthroid 175 mcg daily.  Lab Results  Component Value Date   TSH 0.019 (L) 10/30/2020   2. H/O gastric bypass Isabella Larson still has some restrictions.  3. Prediabetes Isabella Larson has a diagnosis of prediabetes based on her elevated HgA1c and was informed this puts her at greater risk of developing diabetes. She continues to work on diet and exercise to decrease her risk of diabetes. She denies nausea or hypoglycemia.  She is on no medications.  Lab Results  Component Value Date   HGBA1C 6.3 (H) 10/30/2020   Lab Results  Component Value Date   INSULIN 13.1 10/30/2020   INSULIN 14.8 04/26/2020   INSULIN 9.8 12/20/2019   INSULIN 15.4 08/23/2019   INSULIN 15.7 04/18/2019   4. At risk for activity intolerance Jeanae is at risk for activity intolerance due to obesity and weather changes.  Assessment/Plan:   1. Other specified hypothyroidism Patient with long-standing hypothyroidism, on levothyroxine therapy. She appears euthyroid. Orders and follow up as documented in patient record.  Continue Synthroid.  Will check thyroid panel today.  Counseling . Good thyroid control is important for overall health. Supratherapeutic  thyroid levels are dangerous and will not improve weight loss results. . The correct way to take levothyroxine is fasting, with water, separated by at least 30 minutes from breakfast, and separated by more than 4 hours from calcium, iron, multivitamins, acid reflux medications (PPIs).   - TSH+T4F+T3Free  2. H/O gastric bypass She will eat small, frequent meals, but will still follow the plan.  She is at risk for malnutrition due to her previous bariatric surgery.   Counseling  You may need to eat 3 meals and 2 snacks, or 5 small meals each day in order to reach your protein and calorie goals.   Allow at least 15 minutes for each meal so that you can eat mindfully. Listen to your body so that you do not overeat. For most people, your sleeve or pouch will comfortably hold 4-6 ounces.  Eat foods from all food groups. This includes fruits and vegetables, grains, dairy, and meat and other proteins.  Include a protein-rich food at every meal and snack, and eat the protein food first.   You should be taking a Bariatric Multivitamin as well as calcium.   3. Prediabetes Isabella Larson will continue to work on weight loss, exercise, and decreasing simple carbohydrates to help decrease the risk of diabetes.  Will check A1c, insulin level, and CMP today.  - Hemoglobin A1c - Insulin, random - Comprehensive metabolic panel  4. At risk for activity intolerance Isabella Larson was given approximately 15 minutes of exercise intolerance counseling today. She is 40 y.o. female and has risk factors exercise intolerance including obesity. We  discussed intensive lifestyle modifications today with an emphasis on specific weight loss instructions and strategies. Isabella Larson will slowly increase activity as tolerated.  Repetitive spaced learning was employed today to elicit superior memory formation and behavioral change.  5. Class 3 severe obesity with serious comorbidity and body mass index (BMI) of 40.0 to 44.9 in adult,  unspecified obesity type (HCC)  - Refill Phentermine-Topiramate (QSYMIA) 7.5-46 MG CP24; 1 daily in the am  Dispense: 30 capsule; Refill: 0  Poonam is currently in the action stage of change. As such, her goal is to continue with weight loss efforts. She has agreed to the Category 4 Plan.   She will work on meal planning and intentional eating.  Will refill Qsymia 7.5/46 mg daily.  Exercise goals: For substantial health benefits, adults should do at least 150 minutes (2 hours and 30 minutes) a week of moderate-intensity, or 75 minutes (1 hour and 15 minutes) a week of vigorous-intensity aerobic physical activity, or an equivalent combination of moderate- and vigorous-intensity aerobic activity. Aerobic activity should be performed in episodes of at least 10 minutes, and preferably, it should be spread throughout the week.  Behavioral modification strategies: increasing lean protein intake, decreasing simple carbohydrates, increasing vegetables, increasing water intake, decreasing eating out, no skipping meals, meal planning and cooking strategies, keeping healthy foods in the home and planning for success.  Isabella Larson has agreed to follow-up with our clinic in 2 weeks. She was informed of the importance of frequent follow-up visits to maximize her success with intensive lifestyle modifications for her multiple health conditions.   Isabella Larson was informed we would discuss her lab results at her next visit unless there is a critical issue that needs to be addressed sooner. Isabella Larson agreed to keep her next visit at the agreed upon time to discuss these results.  Objective:   Blood pressure 123/80, pulse 70, temperature 97.7 F (36.5 C), height 5\' 8"  (1.727 m), SpO2 100 %. Body mass index is 42.12 kg/m.  General: Cooperative, alert, well developed, in no acute distress. HEENT: Conjunctivae and lids unremarkable. Cardiovascular: Regular rhythm.  Lungs: Normal work of breathing. Neurologic: No  focal deficits.   Lab Results  Component Value Date   CREATININE 0.63 10/30/2020   BUN 16 10/30/2020   NA 142 10/30/2020   K 4.5 10/30/2020   CL 108 (H) 10/30/2020   CO2 22 10/30/2020   Lab Results  Component Value Date   ALT 15 10/30/2020   AST 16 10/30/2020   ALKPHOS 116 10/30/2020   BILITOT 0.2 10/30/2020   Lab Results  Component Value Date   HGBA1C 6.3 (H) 10/30/2020   HGBA1C 6.3 (H) 04/26/2020   HGBA1C 6.0 (H) 12/20/2019   HGBA1C 6.2 (H) 08/23/2019   HGBA1C 5.8 (H) 04/18/2019   Lab Results  Component Value Date   INSULIN 13.1 10/30/2020   INSULIN 14.8 04/26/2020   INSULIN 9.8 12/20/2019   INSULIN 15.4 08/23/2019   INSULIN 15.7 04/18/2019   Lab Results  Component Value Date   TSH 0.019 (L) 10/30/2020   Lab Results  Component Value Date   CHOL 171 04/26/2020   HDL 56 04/26/2020   LDLCALC 102 (H) 04/26/2020   TRIG 65 04/26/2020   Lab Results  Component Value Date   WBC 6.2 08/23/2019   HGB 12.3 08/23/2019   HCT 38.9 08/23/2020   MCV 84 08/23/2019   PLT 292 08/23/2019   Lab Results  Component Value Date   IRON 45 08/23/2020   TIBC 299  08/23/2020   FERRITIN 49 08/23/2020   Attestation Statements:   Reviewed by clinician on day of visit: allergies, medications, problem list, medical history, surgical history, family history, social history, and previous encounter notes.  I, Insurance claims handler, CMA, am acting as Energy manager for [employer], DO  I have reviewed the above documentation for accuracy and completeness, and I agree with the above. Corinna Capra, DO

## 2020-11-20 ENCOUNTER — Ambulatory Visit (INDEPENDENT_AMBULATORY_CARE_PROVIDER_SITE_OTHER): Payer: BC Managed Care – PPO | Admitting: Bariatrics

## 2020-11-20 ENCOUNTER — Encounter (INDEPENDENT_AMBULATORY_CARE_PROVIDER_SITE_OTHER): Payer: Self-pay | Admitting: Bariatrics

## 2020-11-20 ENCOUNTER — Other Ambulatory Visit: Payer: Self-pay

## 2020-11-20 VITALS — BP 120/83 | HR 65 | Temp 97.9°F | Ht 68.0 in | Wt 280.0 lb

## 2020-11-20 DIAGNOSIS — E039 Hypothyroidism, unspecified: Secondary | ICD-10-CM | POA: Diagnosis not present

## 2020-11-20 DIAGNOSIS — Z9189 Other specified personal risk factors, not elsewhere classified: Secondary | ICD-10-CM

## 2020-11-20 DIAGNOSIS — Z6841 Body Mass Index (BMI) 40.0 and over, adult: Secondary | ICD-10-CM | POA: Diagnosis not present

## 2020-11-20 DIAGNOSIS — R7303 Prediabetes: Secondary | ICD-10-CM

## 2020-11-21 NOTE — Progress Notes (Signed)
Chief Complaint:   OBESITY Isabella Larson is here to discuss her progress with her obesity treatment plan along with follow-up of her obesity related diagnoses. Isabella Larson is on the Category 4 Plan and states she is following her eating plan approximately 25% of the time. Isabella Larson states she is doing 0 minutes 0 times per week.  Today's visit was #: 27 Starting weight: 305 lbs Starting date: 04/18/2019 Today's weight: 280 lbs Today's date: 11/20/2020 Total lbs lost to date: 25 Total lbs lost since last in-office visit: 0  Interim History: Isabella Larson is up 5 lbs. She continues to take Qsymia. She has not been getting enough protein after being sick. She states that Qsymia still helps.  Subjective:   1. Acquired hypothyroidism Isabella Larson is taking 175 mcg although 1 tablet 1 day and 1.5 tablet all other days. Last TSH was 0.019, previously 6.29.   2. Pre-diabetes Isabella Larson is not on medications.  3. At risk for activity intolerance Isabella Larson is at risk for exercise intolerance due to obesity and thyroid issues.  Assessment/Plan:   1. Acquired hypothyroidism We will check thyroid panel, and Isabella Larson will continue to follow up as directed. Orders and follow up as documented in patient record.  Counseling . Good thyroid control is important for overall health. Supratherapeutic thyroid levels are dangerous and will not improve weight loss results. . Counseling: The correct way to take levothyroxine is fasting, with water, separated by at least 30 minutes from breakfast, and separated by more than 4 hours from calcium, iron, multivitamins, acid reflux medications (PPIs).   2. Pre-diabetes Isabella Larson will continue to work on weight loss, exercise, increasing protein, and decreasing simple carbohydrates to help decrease the risk of diabetes.   3. At risk for activity intolerance Isabella Larson was given approximately 15 minutes of exercise intolerance counseling today. She is 40 y.o. female and has risk  factors exercise intolerance including obesity. We discussed intensive lifestyle modifications today with an emphasis on specific weight loss instructions and strategies. Isabella Larson will slowly increase activity as tolerated.  Repetitive spaced learning was employed today to elicit superior memory formation and behavioral change.  4. Class 3 severe obesity due to excess calories with serious comorbidity and body mass index (BMI) of 40.0 to 44.9 in adult [facility]) Isabella Larson is currently in the action stage of change. As such, her goal is to continue with weight loss efforts. She has agreed to the Category 4 Plan.   I reviewed labs from 10/30/2020 with the patient today.   We discussed various medication options to help Isabella Larson with her weight loss efforts and we both agreed to continue Qsymia.  Exercise goals: No exercise has been prescribed at this time.  Behavioral modification strategies: increasing lean protein intake, decreasing simple carbohydrates, increasing vegetables, increasing water intake, decreasing eating out, no skipping meals, meal planning and cooking strategies, keeping healthy foods in the home and planning for success.  Harla has agreed to follow-up with our clinic in 2 weeks. She was informed of the importance of frequent follow-up visits to maximize her success with intensive lifestyle modifications for her multiple health conditions.   Objective:   Blood pressure 120/83, pulse 65, temperature 97.9 F (36.6 C), height 5\' 8"  (1.727 m), weight 280 lb (127 kg), SpO2 98 %. Body mass index is 42.57 kg/m.  General: Cooperative, alert, well developed, in no acute distress. HEENT: Conjunctivae and lids unremarkable. Cardiovascular: Regular rhythm.  Lungs: Normal work of breathing. Neurologic: No focal deficits.   Lab Results  Component Value Date   CREATININE 0.63 10/30/2020   BUN 16 10/30/2020   NA 142 10/30/2020   K 4.5 10/30/2020   CL 108 (H) 10/30/2020   CO2 22  10/30/2020   Lab Results  Component Value Date   ALT 15 10/30/2020   AST 16 10/30/2020   ALKPHOS 116 10/30/2020   BILITOT 0.2 10/30/2020   Lab Results  Component Value Date   HGBA1C 6.3 (H) 10/30/2020   HGBA1C 6.3 (H) 04/26/2020   HGBA1C 6.0 (H) 12/20/2019   HGBA1C 6.2 (H) 08/23/2019   HGBA1C 5.8 (H) 04/18/2019   Lab Results  Component Value Date   INSULIN 13.1 10/30/2020   INSULIN 14.8 04/26/2020   INSULIN 9.8 12/20/2019   INSULIN 15.4 08/23/2019   INSULIN 15.7 04/18/2019   Lab Results  Component Value Date   TSH 0.019 (L) 10/30/2020   Lab Results  Component Value Date   CHOL 171 04/26/2020   HDL 56 04/26/2020   LDLCALC 102 (H) 04/26/2020   TRIG 65 04/26/2020   Lab Results  Component Value Date   WBC 6.2 08/23/2019   HGB 12.3 08/23/2019   HCT 38.9 08/23/2020   MCV 84 08/23/2019   PLT 292 08/23/2019   Lab Results  Component Value Date   IRON 45 08/23/2020   TIBC 299 08/23/2020   FERRITIN 49 08/23/2020   Attestation Statements:   Reviewed by clinician on day of visit: allergies, medications, problem list, medical history, surgical history, family history, social history, and previous encounter notes.   Trude Mcburney, am acting as Energy manager for [employer], DO.  I have reviewed the above documentation for accuracy and completeness, and I agree with the above. Corinna Capra, DO

## 2020-11-22 ENCOUNTER — Encounter (INDEPENDENT_AMBULATORY_CARE_PROVIDER_SITE_OTHER): Payer: Self-pay | Admitting: Bariatrics

## 2020-11-25 ENCOUNTER — Encounter (INDEPENDENT_AMBULATORY_CARE_PROVIDER_SITE_OTHER): Payer: Self-pay | Admitting: Bariatrics

## 2020-11-26 ENCOUNTER — Other Ambulatory Visit (INDEPENDENT_AMBULATORY_CARE_PROVIDER_SITE_OTHER): Payer: Self-pay

## 2020-11-26 MED ORDER — LEVOTHYROXINE SODIUM 175 MCG PO TABS
175.0000 ug | ORAL_TABLET | Freq: Every day | ORAL | 0 refills | Status: DC
Start: 2020-11-26 — End: 2021-01-08

## 2020-11-26 NOTE — Telephone Encounter (Signed)
Refill request

## 2020-12-10 ENCOUNTER — Encounter (INDEPENDENT_AMBULATORY_CARE_PROVIDER_SITE_OTHER): Payer: Self-pay | Admitting: Bariatrics

## 2020-12-10 ENCOUNTER — Other Ambulatory Visit: Payer: Self-pay

## 2020-12-10 ENCOUNTER — Ambulatory Visit (INDEPENDENT_AMBULATORY_CARE_PROVIDER_SITE_OTHER): Payer: BC Managed Care – PPO | Admitting: Bariatrics

## 2020-12-10 VITALS — BP 118/75 | HR 64 | Temp 97.7°F | Ht 68.0 in | Wt 278.0 lb

## 2020-12-10 DIAGNOSIS — E559 Vitamin D deficiency, unspecified: Secondary | ICD-10-CM

## 2020-12-10 DIAGNOSIS — E039 Hypothyroidism, unspecified: Secondary | ICD-10-CM

## 2020-12-10 DIAGNOSIS — Z9189 Other specified personal risk factors, not elsewhere classified: Secondary | ICD-10-CM

## 2020-12-10 DIAGNOSIS — R7303 Prediabetes: Secondary | ICD-10-CM | POA: Diagnosis not present

## 2020-12-10 DIAGNOSIS — Z6841 Body Mass Index (BMI) 40.0 and over, adult: Secondary | ICD-10-CM

## 2020-12-11 ENCOUNTER — Encounter (INDEPENDENT_AMBULATORY_CARE_PROVIDER_SITE_OTHER): Payer: Self-pay | Admitting: Bariatrics

## 2020-12-11 LAB — TSH+T4F+T3FREE
Free T4: 2.5 ng/dL — ABNORMAL HIGH (ref 0.82–1.77)
T3, Free: 4 pg/mL (ref 2.0–4.4)
TSH: <0.005 u[IU]/mL — ABNORMAL LOW (ref 0.450–4.500)

## 2020-12-11 LAB — VITAMIN D 25 HYDROXY (VIT D DEFICIENCY, FRACTURES): Vit D, 25-Hydroxy: 27.2 ng/mL — ABNORMAL LOW (ref 30.0–100.0)

## 2020-12-11 NOTE — Progress Notes (Signed)
Pt advised.

## 2020-12-11 NOTE — Progress Notes (Signed)
Chief Complaint:   OBESITY Isabella Larson is here to discuss her progress with her obesity treatment plan along with follow-up of her obesity related diagnoses. Isabella Larson is on the Category 4 Plan and states she is following her eating plan approximately 60% of the time. Isabella Larson states she is doing 0 minutes 0 times per week.  Today's visit was #: 28 Starting weight: 305 lbs Starting date: 04/18/2019 Today's weight: 278 lbs Today's date: 12/10/2020 Total lbs lost to date: 27 lbs Total lbs lost since last in-office visit: 2 lbs  Interim History: Isabella Larson is down an additional 2 lbs. She is taking Qsymia. She is doing well with water and eating more protein.  Subjective:   1. Acquired hypothyroidism Isabella Larson is taking Synthroid. Her energy level is OK.   2. Pre-diabetes Isabella Larson is not on medication. She has increased snacks with her cycle.   3. Vitamin D deficiency Isabella Larson's Vitamin D level was 30.3 on 08/23/2020. She is currently taking a multivitamin.   Ref. Range 08/23/2020 15:04  Vitamin D, 25-Hydroxy Latest Ref Range: 30.0 - 100.0 ng/mL 30.3   4. At risk for hypoglycemia Isabella Larson is at increased risk for hypoglycemia due to pre-diabetes and obesity.  Assessment/Plan:   1. Acquired hypothyroidism Patient with long-standing hypothyroidism, on levothyroxine therapy. She appears euthyroid. Orders and follow up as documented in patient record. Check labs today.  Counseling  Good thyroid control is important for overall health. Supratherapeutic thyroid levels are dangerous and will not improve weight loss results.  The correct way to take levothyroxine is fasting, with water, separated by at least 30 minutes from breakfast, and separated by more than 4 hours from calcium, iron, multivitamins, acid reflux medications (PPIs).   - TSH+T4F+T3Free  2. Pre-diabetes Isabella Larson will continue to work on weight loss, exercise, and decreasing simple carbohydrates to help decrease the  risk of diabetes.   3. Vitamin D deficiency Low Vitamin D level contributes to fatigue and are associated with obesity, breast, and colon cancer. She agrees to follow-up for routine testing of Vitamin D, at least 2-3 times per year to avoid over-replacement. Check labs today.  - VITAMIN D 25 Hydroxy (Vit-D Deficiency, Fractures)  4. At risk for hypoglycemia Isabella Larson was given approximately 15 minutes of counseling today regarding prevention of hypoglycemia. She was advised of symptoms of hypoglycemia. Isabella Larson was instructed to avoid skipping meals, eat regular protein rich meals and schedule low calorie snacks as needed.   Repetitive spaced learning was employed today to elicit superior memory formation and behavioral change  5. Class 3 severe obesity due to excess calories with serious comorbidity and body mass index (BMI) of 40.0 to 44.9 in adult [facility]) Isabella Larson is currently in the action stage of change. As such, her goal is to continue with weight loss efforts. She has agreed to the Category 4 Plan.   Exercise goals: As is  Behavioral modification strategies: increasing lean protein intake, decreasing simple carbohydrates, increasing vegetables, increasing water intake, decreasing eating out, no skipping meals, meal planning and cooking strategies, keeping healthy foods in the home and planning for success.  Isabella Larson has agreed to follow-up with our clinic in 2 weeks. She was informed of the importance of frequent follow-up visits to maximize her success with intensive lifestyle modifications for her multiple health conditions.   Isabella Larson was informed we would discuss her lab results at her next visit unless there is a critical issue that needs to be addressed sooner. Isabella Larson agreed to keep her next  visit at the agreed upon time to discuss these results.  Objective:   Blood pressure 118/75, pulse 64, temperature 97.7 F (36.5 C), height 5\' 8"  (1.727 m), weight 278 lb (126.1 kg), SpO2  99 %. Body mass index is 42.27 kg/m.  General: Cooperative, alert, well developed, in no acute distress. HEENT: Conjunctivae and lids unremarkable. Cardiovascular: Regular rhythm.  Lungs: Normal work of breathing. Neurologic: No focal deficits.   Lab Results  Component Value Date   CREATININE 0.63 10/30/2020   BUN 16 10/30/2020   NA 142 10/30/2020   K 4.5 10/30/2020   CL 108 (H) 10/30/2020   CO2 22 10/30/2020   Lab Results  Component Value Date   ALT 15 10/30/2020   AST 16 10/30/2020   ALKPHOS 116 10/30/2020   BILITOT 0.2 10/30/2020   Lab Results  Component Value Date   HGBA1C 6.3 (H) 10/30/2020   HGBA1C 6.3 (H) 04/26/2020   HGBA1C 6.0 (H) 12/20/2019   HGBA1C 6.2 (H) 08/23/2019   HGBA1C 5.8 (H) 04/18/2019   Lab Results  Component Value Date   INSULIN 13.1 10/30/2020   INSULIN 14.8 04/26/2020   INSULIN 9.8 12/20/2019   INSULIN 15.4 08/23/2019   INSULIN 15.7 04/18/2019   Lab Results  Component Value Date   TSH <0.005 (L) 12/10/2020   Lab Results  Component Value Date   CHOL 171 04/26/2020   HDL 56 04/26/2020   LDLCALC 102 (H) 04/26/2020   TRIG 65 04/26/2020   Lab Results  Component Value Date   WBC 6.2 08/23/2019   HGB 12.3 08/23/2019   HCT 38.9 08/23/2020   MCV 84 08/23/2019   PLT 292 08/23/2019   Lab Results  Component Value Date   IRON 45 08/23/2020   TIBC 299 08/23/2020   FERRITIN 49 08/23/2020    Attestation Statements:   Reviewed by clinician on day of visit: allergies, medications, problem list, medical history, surgical history, family history, social history, and previous encounter notes.  14/10/2019, am acting as Edmund Hilda for Energy manager, DO.  I have reviewed the above documentation for accuracy and completeness, and I agree with the above. [employer], DO

## 2020-12-17 ENCOUNTER — Other Ambulatory Visit (INDEPENDENT_AMBULATORY_CARE_PROVIDER_SITE_OTHER): Payer: Self-pay | Admitting: Bariatrics

## 2020-12-25 ENCOUNTER — Ambulatory Visit (INDEPENDENT_AMBULATORY_CARE_PROVIDER_SITE_OTHER): Payer: BC Managed Care – PPO | Admitting: Bariatrics

## 2021-01-08 ENCOUNTER — Other Ambulatory Visit: Payer: Self-pay

## 2021-01-08 ENCOUNTER — Ambulatory Visit (INDEPENDENT_AMBULATORY_CARE_PROVIDER_SITE_OTHER): Payer: BC Managed Care – PPO | Admitting: Bariatrics

## 2021-01-08 ENCOUNTER — Encounter (INDEPENDENT_AMBULATORY_CARE_PROVIDER_SITE_OTHER): Payer: Self-pay | Admitting: Bariatrics

## 2021-01-08 VITALS — BP 123/86 | HR 60 | Temp 97.5°F | Ht 68.0 in | Wt 283.0 lb

## 2021-01-08 DIAGNOSIS — Z9884 Bariatric surgery status: Secondary | ICD-10-CM

## 2021-01-08 DIAGNOSIS — E038 Other specified hypothyroidism: Secondary | ICD-10-CM

## 2021-01-08 DIAGNOSIS — Z6841 Body Mass Index (BMI) 40.0 and over, adult: Secondary | ICD-10-CM

## 2021-01-08 DIAGNOSIS — Z9189 Other specified personal risk factors, not elsewhere classified: Secondary | ICD-10-CM

## 2021-01-08 MED ORDER — LEVOTHYROXINE SODIUM 175 MCG PO TABS: 175.0000 ug | ORAL_TABLET | Freq: Every day | ORAL | 0 refills | Status: DC

## 2021-01-08 MED ORDER — PHENTERMINE-TOPIRAMATE 11.25-69 MG PO CP24
ORAL_CAPSULE | ORAL | 0 refills | Status: DC
Start: 2021-01-08 — End: 2021-03-27

## 2021-01-09 ENCOUNTER — Encounter (INDEPENDENT_AMBULATORY_CARE_PROVIDER_SITE_OTHER): Payer: Self-pay | Admitting: Bariatrics

## 2021-01-09 NOTE — Progress Notes (Signed)
Chief Complaint:   OBESITY Isabella Larson is here to discuss her progress with her obesity treatment plan along with follow-up of her obesity related diagnoses. Isabella Larson is on the Category 4 Plan and states she is following her eating plan approximately 50% of the time. Isabella Larson states she is not exercising regularly.  Today's visit was #: 29 Starting weight: 305 lbs Starting date: 04/18/2019 Today's weight: 283 lbs Today's date: 01/08/2021 Total lbs lost to date: 22 lbs Total lbs lost since last in-office visit: 0  Interim History: Isabella Larson is doing well, but is having more cravings.  The Qsymia has been working well.  Subjective:   1. Other specified hypothyroidism She is taking Synthroid 175 mcg daily.  Since low TSH, dose changed.  Lab Results  Component Value Date   TSH <0.005 (L) 12/10/2020   2. H/O gastric bypass Still has some restrictions.  3. At risk for activity intolerance Isabella Larson is at risk for activity intolerance due to obesity and hypothyroidism.   Assessment/Plan:   1. Other specified hypothyroidism Patient with long-standing hypothyroidism, on levothyroxine therapy. She appears euthyroid. Orders and follow up as documented in patient record.  Recheck thyroid panel in about 3 weeks.  Will refill Synthroid today, as per below.  Counseling . Good thyroid control is important for overall health. Supratherapeutic thyroid levels are dangerous and will not improve weight loss results. . The correct way to take levothyroxine is fasting, with water, separated by at least 30 minutes from breakfast, and separated by more than 4 hours from calcium, iron, multivitamins, acid reflux medications (PPIs).   - Refill levothyroxine (SYNTHROID) 175 MCG tablet; Take 1 tablet (175 mcg total) by mouth daily before breakfast.  Dispense: 30 tablet; Refill: 0  2. H/O gastric bypass Isabella Larson is at risk for malnutrition due to her previous bariatric surgery.  Continue small, frequent  meals.  Counseling  You may need to eat 3 meals and 2 snacks, or 5 small meals each Isabella Larson in order to reach your protein and calorie goals.   Allow at least 15 minutes for each meal so that you can eat mindfully. Listen to your body so that you do not overeat. For most people, your sleeve or pouch will comfortably hold 4-6 ounces.  Eat foods from all food groups. This includes fruits and vegetables, grains, dairy, and meat and other proteins.  Include a protein-rich food at every meal and snack, and eat the protein food first.   You should be taking a Bariatric Multivitamin as well as calcium.   - Increase Phentermine-Topiramate 11.25-69 MG CP24; 1 capsule daily in the am.  Dispense: 30 capsule; Refill: 0  3. At risk for activity intolerance Isabella Larson was given approximately 15 minutes of exercise intolerance counseling today. She is 40 y.o. female and has risk factors exercise intolerance including obesity. We discussed intensive lifestyle modifications today with an emphasis on specific weight loss instructions and strategies. Isabella Larson will slowly increase activity as tolerated.  Repetitive spaced learning was employed today to elicit superior memory formation and behavioral change.  1. Obesity, current BMI 43  Isabella Larson is currently in the action stage of change. As such, her goal is to continue with weight loss efforts. She has agreed to the Category 4 Plan.   She will work on meal planning, intentional eating, and will increase Qsymia to 11.25/69 mg daily (from 7.5/46 mg).  Exercise goals: For substantial health benefits, adults should do at least 150 minutes (2 hours and 30 minutes)  a week of moderate-intensity, or 75 minutes (1 hour and 15 minutes) a week of vigorous-intensity aerobic physical activity, or an equivalent combination of moderate- and vigorous-intensity aerobic activity. Aerobic activity should be performed in episodes of at least 10 minutes, and preferably, it should be  spread throughout the week.  Behavioral modification strategies: increasing lean protein intake, decreasing simple carbohydrates, increasing vegetables, increasing water intake, decreasing eating out, no skipping meals, meal planning and cooking strategies, keeping healthy foods in the home and planning for success.  Isabella Larson has agreed to follow-up with our clinic in 3-4 weeks. She was informed of the importance of frequent follow-up visits to maximize her success with intensive lifestyle modifications for her multiple health conditions.   Objective:   Blood pressure 123/86, pulse 60, temperature (!) 97.5 F (36.4 C), height 5\' 8"  (1.727 m), weight 283 lb (128.4 kg), SpO2 99 %. Body mass index is 43.03 kg/m.  General: Cooperative, alert, well developed, in no acute distress. HEENT: Conjunctivae and lids unremarkable. Cardiovascular: Regular rhythm.  Lungs: Normal work of breathing. Neurologic: No focal deficits.   Lab Results  Component Value Date   CREATININE 0.63 10/30/2020   BUN 16 10/30/2020   NA 142 10/30/2020   K 4.5 10/30/2020   CL 108 (H) 10/30/2020   CO2 22 10/30/2020   Lab Results  Component Value Date   ALT 15 10/30/2020   AST 16 10/30/2020   ALKPHOS 116 10/30/2020   BILITOT 0.2 10/30/2020   Lab Results  Component Value Date   HGBA1C 6.3 (H) 10/30/2020   HGBA1C 6.3 (H) 04/26/2020   HGBA1C 6.0 (H) 12/20/2019   HGBA1C 6.2 (H) 08/23/2019   HGBA1C 5.8 (H) 04/18/2019   Lab Results  Component Value Date   INSULIN 13.1 10/30/2020   INSULIN 14.8 04/26/2020   INSULIN 9.8 12/20/2019   INSULIN 15.4 08/23/2019   INSULIN 15.7 04/18/2019   Lab Results  Component Value Date   TSH <0.005 (L) 12/10/2020   Lab Results  Component Value Date   CHOL 171 04/26/2020   HDL 56 04/26/2020   LDLCALC 102 (H) 04/26/2020   TRIG 65 04/26/2020   Lab Results  Component Value Date   WBC 6.2 08/23/2019   HGB 12.3 08/23/2019   HCT 38.9 08/23/2020   MCV 84 08/23/2019   PLT  292 08/23/2019   Lab Results  Component Value Date   IRON 45 08/23/2020   TIBC 299 08/23/2020   FERRITIN 49 08/23/2020   Attestation Statements:   Reviewed by clinician on Isabella Larson of visit: allergies, medications, problem list, medical history, surgical history, family history, social history, and previous encounter notes.  I, 14/10/2019, CMA, am acting as Insurance claims handler for Energy manager, DO  I have reviewed the above documentation for accuracy and completeness, and I agree with the above. [employer], DO

## 2021-01-22 ENCOUNTER — Ambulatory Visit (INDEPENDENT_AMBULATORY_CARE_PROVIDER_SITE_OTHER): Payer: BC Managed Care – PPO | Admitting: Bariatrics

## 2021-01-30 ENCOUNTER — Ambulatory Visit (INDEPENDENT_AMBULATORY_CARE_PROVIDER_SITE_OTHER): Payer: BC Managed Care – PPO | Admitting: Bariatrics

## 2021-01-30 ENCOUNTER — Encounter (INDEPENDENT_AMBULATORY_CARE_PROVIDER_SITE_OTHER): Payer: Self-pay | Admitting: Bariatrics

## 2021-01-30 ENCOUNTER — Other Ambulatory Visit: Payer: Self-pay

## 2021-01-30 VITALS — BP 118/78 | HR 66 | Temp 98.1°F | Ht 68.0 in | Wt 286.0 lb

## 2021-01-30 DIAGNOSIS — Z9884 Bariatric surgery status: Secondary | ICD-10-CM | POA: Diagnosis not present

## 2021-01-30 DIAGNOSIS — E559 Vitamin D deficiency, unspecified: Secondary | ICD-10-CM | POA: Diagnosis not present

## 2021-01-30 DIAGNOSIS — D508 Other iron deficiency anemias: Secondary | ICD-10-CM | POA: Diagnosis not present

## 2021-01-30 DIAGNOSIS — E038 Other specified hypothyroidism: Secondary | ICD-10-CM

## 2021-01-30 DIAGNOSIS — Z9189 Other specified personal risk factors, not elsewhere classified: Secondary | ICD-10-CM | POA: Diagnosis not present

## 2021-01-30 DIAGNOSIS — Z6841 Body Mass Index (BMI) 40.0 and over, adult: Secondary | ICD-10-CM

## 2021-01-31 LAB — ANEMIA PANEL
Ferritin: 7 ng/mL — ABNORMAL LOW (ref 15–150)
Folate, Hemolysate: 286 ng/mL
Folate, RBC: 801 ng/mL (ref >498)
Hematocrit: 35.7 % (ref 34.0–46.6)
Iron Saturation: 10 % — ABNORMAL LOW (ref 15–55)
Iron: 37 ug/dL (ref 27–159)
Retic Ct Pct: 1.2 % (ref 0.6–2.6)
Total Iron Binding Capacity: 384 ug/dL (ref 250–450)
UIBC: 347 ug/dL (ref 131–425)
Vitamin B-12: 859 pg/mL (ref 232–1245)

## 2021-01-31 LAB — VITAMIN D 25 HYDROXY (VIT D DEFICIENCY, FRACTURES): Vit D, 25-Hydroxy: 27.1 ng/mL — ABNORMAL LOW (ref 30.0–100.0)

## 2021-01-31 LAB — TSH+T4F+T3FREE
Free T4: 1.6 ng/dL (ref 0.82–1.77)
T3, Free: 2.8 pg/mL (ref 2.0–4.4)
TSH: 0.823 u[IU]/mL (ref 0.450–4.500)

## 2021-01-31 NOTE — Progress Notes (Signed)
LRFT MSG FOR PT

## 2021-01-31 NOTE — Progress Notes (Signed)
Chief Complaint:   OBESITY Isabella Larson is here to discuss her progress with her obesity treatment plan along with follow-up of her obesity related diagnoses. Isabella Larson is on the Category 4 Plan and states she is following her eating plan approximately 40% of the time. Isabella Larson states she is not currently exercising.  Today's visit was #: 30 Starting weight: 305 lbs Starting date: 04/18/2019 Today's weight: 286 lbs Today's date: 01/30/2021 Total lbs lost to date: 19 Total lbs lost since last in-office visit: 0  Interim History: Isabella Larson is up 3 lbs since her last visit. She is taking Qsymia.  Subjective:   1. Other specified hypothyroidism Isabella Larson is taking Synthroid 175 mcg.  2. H/O gastric bypass Isabella Larson is taking an daily multivitamin. She reports minimal resistance.  3. Other iron deficiency anemia Isabella Larson has a history of gastric bypass surgery.  4. Vitamin D deficiency Pt denies nausea, vomiting, and muscle weakness.  5. At risk for constipation Isabella Larson is at increased risk for constipation due to inadequate water intake, changes in diet, and/or use of medications such as GLP1 agonists. Isabella Larson denies hard, infrequent stools currently.   Assessment/Plan:   1. Other specified hypothyroidism Patient with long-standing hypothyroidism, on levothyroxine therapy. She appears euthyroid. Orders and follow up as documented in patient record. Check labs today.  Counseling . Good thyroid control is important for overall health. Supratherapeutic thyroid levels are dangerous and will not improve weight loss results. . The correct way to take levothyroxine is fasting, with water, separated by at least 30 minutes from breakfast, and separated by more than 4 hours from calcium, iron, multivitamins, acid reflux medications (PPIs).   - TSH+T4F+T3Free  2. H/O gastric bypass Continue diet and exercise. Smaller meals and increase protein.  - VITAMIN D 25 Hydroxy (Vit-D  Deficiency, Fractures)  3. Other iron deficiency anemia Check labs today.  - Anemia panel  4. Vitamin D deficiency Low Vitamin D level contributes to fatigue and are associated with obesity, breast, and colon cancer. She agrees to follow-up for routine testing of Vitamin D, at least 2-3 times per year to avoid over-replacement.Check labs today.  - VITAMIN D 25 Hydroxy (Vit-D Deficiency, Fractures)  5. At risk for constipation Isabella Larson was given approximately 15 minutes of counseling today regarding prevention of constipation. She was encouraged to increase water and fiber intake.   6. Obesity, current BMI 43  Isabella Larson is currently in the action stage of change. As such, her goal is to continue with weight loss efforts. She has agreed to the Category 4 Plan.   Meal plan Intentional eating Continue Qsymia  Exercise goals: As is  Behavioral modification strategies: increasing lean protein intake, decreasing simple carbohydrates, increasing vegetables, increasing water intake, decreasing eating out, no skipping meals, meal planning and cooking strategies, keeping healthy foods in the home and planning for success.  Isabella Larson has agreed to follow-up with our clinic in 2 weeks. She was informed of the importance of frequent follow-up visits to maximize her success with intensive lifestyle modifications for her multiple health conditions.   Isabella Larson was informed we would discuss her lab results at her next visit unless there is a critical issue that needs to be addressed sooner. Isabella Larson agreed to keep her next visit at the agreed upon time to discuss these results.  Objective:   Blood pressure 118/78, pulse 66, temperature 98.1 F (36.7 C), height 5\' 8"  (1.727 m), weight 286 lb (129.7 kg), last menstrual period 01/27/2021, SpO2 100 %. Body mass  index is 43.49 kg/m.  General: Cooperative, alert, well developed, in no acute distress. HEENT: Conjunctivae and lids  unremarkable. Cardiovascular: Regular rhythm.  Lungs: Normal work of breathing. Neurologic: No focal deficits.   Lab Results  Component Value Date   CREATININE 0.63 10/30/2020   BUN 16 10/30/2020   NA 142 10/30/2020   K 4.5 10/30/2020   CL 108 (H) 10/30/2020   CO2 22 10/30/2020   Lab Results  Component Value Date   ALT 15 10/30/2020   AST 16 10/30/2020   ALKPHOS 116 10/30/2020   BILITOT 0.2 10/30/2020   Lab Results  Component Value Date   HGBA1C 6.3 (H) 10/30/2020   HGBA1C 6.3 (H) 04/26/2020   HGBA1C 6.0 (H) 12/20/2019   HGBA1C 6.2 (H) 08/23/2019   HGBA1C 5.8 (H) 04/18/2019   Lab Results  Component Value Date   INSULIN 13.1 10/30/2020   INSULIN 14.8 04/26/2020   INSULIN 9.8 12/20/2019   INSULIN 15.4 08/23/2019   INSULIN 15.7 04/18/2019   Lab Results  Component Value Date   TSH 0.823 01/30/2021   Lab Results  Component Value Date   CHOL 171 04/26/2020   HDL 56 04/26/2020   LDLCALC 102 (H) 04/26/2020   TRIG 65 04/26/2020   Lab Results  Component Value Date   WBC 6.2 08/23/2019   HGB 12.3 08/23/2019   HCT 35.7 01/30/2021   MCV 84 08/23/2019   PLT 292 08/23/2019   Lab Results  Component Value Date   IRON 37 01/30/2021   TIBC 384 01/30/2021   FERRITIN WILL FOLLOW 01/30/2021    Attestation Statements:   Reviewed by clinician on day of visit: allergies, medications, problem list, medical history, surgical history, family history, social history, and previous encounter notes.  Edmund Hilda, CMA, am acting as Energy manager for [employer], DO.  I have reviewed the above documentation for accuracy and completeness, and I agree with the above. Corinna Capra, DO

## 2021-02-04 NOTE — Progress Notes (Signed)
Pt advised.

## 2021-02-27 ENCOUNTER — Other Ambulatory Visit: Payer: Self-pay

## 2021-02-27 ENCOUNTER — Ambulatory Visit (INDEPENDENT_AMBULATORY_CARE_PROVIDER_SITE_OTHER): Payer: BC Managed Care – PPO | Admitting: Bariatrics

## 2021-02-27 ENCOUNTER — Encounter (INDEPENDENT_AMBULATORY_CARE_PROVIDER_SITE_OTHER): Payer: Self-pay | Admitting: Bariatrics

## 2021-02-27 VITALS — BP 117/77 | HR 67 | Temp 97.5°F | Ht 68.0 in | Wt 290.0 lb

## 2021-02-27 DIAGNOSIS — E038 Other specified hypothyroidism: Secondary | ICD-10-CM

## 2021-02-27 DIAGNOSIS — E65 Localized adiposity: Secondary | ICD-10-CM

## 2021-02-27 DIAGNOSIS — R600 Localized edema: Secondary | ICD-10-CM

## 2021-02-27 DIAGNOSIS — Z6841 Body Mass Index (BMI) 40.0 and over, adult: Secondary | ICD-10-CM

## 2021-02-27 DIAGNOSIS — Z9189 Other specified personal risk factors, not elsewhere classified: Secondary | ICD-10-CM | POA: Diagnosis not present

## 2021-02-27 MED ORDER — FUROSEMIDE 20 MG PO TABS: 20.0000 mg | ORAL_TABLET | ORAL | 0 refills | Status: DC

## 2021-02-27 NOTE — Progress Notes (Addendum)
Chief Complaint:   OBESITY Isabella Larson is here to discuss her progress with her obesity treatment plan along with follow-up of her obesity related diagnoses. Isabella Larson is on the Category 4 Plan and states she is following her eating plan approximately 20% of the time. Isabella Larson states she is not exercising at this time.  Today's visit was #: 31 Starting weight: 305 lbs Starting date: 04/18/2019 Today's weight: 290 lbs Today's date: 02/27/2021 Total lbs lost to date: 15 lbs Total lbs lost since last in-office visit: +4 lbs  Interim History: Isabella Larson is up 4 pounds since her last visit, but she does have some increased water weight. She has had COVID-19 but is feeling okay except for some some feelings of fatigue. She reported that she did not take her Qsymia while she was sick.  Subjective:   1. Other specified hypothyroidism Current symptoms: None reported. She reports that she is still taking Synthroid.  Lab Results  Component Value Date   TSH 0.823 01/30/2021    2. Visceral obesity Isabella Larson has a history of bariatric surgery.   3. Bilateral lower extremity edema Isabella Larson has some bilateral lower leg edema today. Today, it is 1 to 2+ pitting, bilaterally. She has not experienced any shortness of breath and notes that the symptoms worsened after she contracted COVID-19, but she did have some mild symptoms prior to infection. She experiences pain by the end of the night  4. At risk for activity intolerance Isabella Larson is at an increased risk for activity and exercise intolerance because of her recent COVID-19 infection.  Assessment/Plan:   1. Other specified hypothyroidism Isabella Larson has long-standing hypothyroidism and is on levothyroxine therapy. She appears euthyroid. Continue taking Levothyroxine daily as prescribed, and follow up as documented in patient record.  Counseling Good thyroid control is important for overall health. Supratherapeutic thyroid levels are dangerous and  will not improve weight loss results. The correct way to take levothyroxine is fasting, with water, separated by at least 30 minutes from breakfast, and separated by more than 4 hours from calcium, iron, multivitamins, acid reflux medications (PPIs).   2. Visceral obesity Continue to work on your diet and exercise as tolerated. Try to stay away from foods with trans fats, and decrease carbohydrates.  3. Bilateral lower extremity edema Try to increase your water intake, do not add any extra salt to your diet, and decrease consumption of electrolyte water, like Gatorade.   Start: Lasix 20mg  as per below.  - Start Furosemide (LASIX) 20 MG tablet; Take 1 tablet (20 mg total) by mouth every 3 (three) days.  Dispense: 30 tablet; Refill: 0  4. At risk for activity intolerance Isabella Larson was given approximately 15 minutes of exercise intolerance counseling today. She is 40 y.o. female and has risk factors exercise intolerance including obesity. We discussed intensive lifestyle modifications today with an emphasis on specific weight loss instructions and strategies. Isabella Larson will slowly increase activity as tolerated.  Repetitive spaced learning was employed today to elicit superior memory formation and behavioral change.  5. Obesity with current BMI of 44.1 Isabella Larson will continue with her Category 4 Meal Plan and work on mindful eating strategies.  Isabella Larson is currently in the action stage of change. As such, her goal is to continue with weight loss efforts. She has agreed to the Category 4 Plan.   Exercise goals: As is.   Behavioral modification strategies: increasing lean protein intake, decreasing simple carbohydrates, increasing vegetables, increasing water intake, decreasing eating out, no skipping meals,  meal planning and cooking strategies, keeping healthy foods in the home and planning for success.  Isabella Larson has agreed to follow-up with our clinic in 2 weeks. She was informed of the  importance of frequent follow-up visits to maximize her success with intensive lifestyle modifications for her multiple health conditions.   Objective:   Blood pressure 117/77, pulse 67, temperature (!) 97.5 F (36.4 C), height 5\' 8"  (1.727 m), weight 290 lb (131.5 kg), SpO2 98 %. Body mass index is 44.09 kg/m.  General: Cooperative, alert, well developed, in no acute distress. HEENT: Conjunctivae and lids unremarkable. Cardiovascular: Regular rhythm.  Lungs: Normal work of breathing. Neurologic: No focal deficits.   Lab Results  Component Value Date   CREATININE 0.63 10/30/2020   BUN 16 10/30/2020   NA 142 10/30/2020   K 4.5 10/30/2020   CL 108 (H) 10/30/2020   CO2 22 10/30/2020   Lab Results  Component Value Date   ALT 15 10/30/2020   AST 16 10/30/2020   ALKPHOS 116 10/30/2020   BILITOT 0.2 10/30/2020   Lab Results  Component Value Date   HGBA1C 6.3 (H) 10/30/2020   HGBA1C 6.3 (H) 04/26/2020   HGBA1C 6.0 (H) 12/20/2019   HGBA1C 6.2 (H) 08/23/2019   HGBA1C 5.8 (H) 04/18/2019   Lab Results  Component Value Date   INSULIN 13.1 10/30/2020   INSULIN 14.8 04/26/2020   INSULIN 9.8 12/20/2019   INSULIN 15.4 08/23/2019   INSULIN 15.7 04/18/2019   Lab Results  Component Value Date   TSH 0.823 01/30/2021   Lab Results  Component Value Date   CHOL 171 04/26/2020   HDL 56 04/26/2020   LDLCALC 102 (H) 04/26/2020   TRIG 65 04/26/2020   Lab Results  Component Value Date   WBC 6.2 08/23/2019   HGB 12.3 08/23/2019   HCT 35.7 01/30/2021   MCV 84 08/23/2019   PLT 292 08/23/2019   Lab Results  Component Value Date   IRON 37 01/30/2021   TIBC 384 01/30/2021   FERRITIN 7 (L) 01/30/2021   Attestation Statements:   Reviewed by clinician on day of visit: allergies, medications, problem list, medical history, surgical history, family history, social history, and previous encounter notes.    I, Noah Mincy,LPN am acting as 04/01/2021 for Energy manager, DO.  I  have reviewed the above documentation for accuracy and completeness, and I agree with the above. -[employer], DO

## 2021-02-28 ENCOUNTER — Encounter (INDEPENDENT_AMBULATORY_CARE_PROVIDER_SITE_OTHER): Payer: Self-pay | Admitting: Bariatrics

## 2021-03-13 ENCOUNTER — Ambulatory Visit (INDEPENDENT_AMBULATORY_CARE_PROVIDER_SITE_OTHER): Payer: BC Managed Care – PPO | Admitting: Bariatrics

## 2021-03-27 ENCOUNTER — Other Ambulatory Visit: Payer: Self-pay

## 2021-03-27 ENCOUNTER — Ambulatory Visit (INDEPENDENT_AMBULATORY_CARE_PROVIDER_SITE_OTHER): Payer: BC Managed Care – PPO | Admitting: Bariatrics

## 2021-03-27 ENCOUNTER — Encounter (INDEPENDENT_AMBULATORY_CARE_PROVIDER_SITE_OTHER): Payer: Self-pay | Admitting: Bariatrics

## 2021-03-27 VITALS — BP 106/72 | HR 81 | Temp 98.1°F | Ht 68.0 in | Wt 283.0 lb

## 2021-03-27 DIAGNOSIS — E038 Other specified hypothyroidism: Secondary | ICD-10-CM | POA: Diagnosis not present

## 2021-03-27 DIAGNOSIS — Z9189 Other specified personal risk factors, not elsewhere classified: Secondary | ICD-10-CM | POA: Diagnosis not present

## 2021-03-27 DIAGNOSIS — R600 Localized edema: Secondary | ICD-10-CM

## 2021-03-27 DIAGNOSIS — Z9884 Bariatric surgery status: Secondary | ICD-10-CM

## 2021-03-27 DIAGNOSIS — Z6841 Body Mass Index (BMI) 40.0 and over, adult: Secondary | ICD-10-CM

## 2021-03-27 MED ORDER — FUROSEMIDE 20 MG PO TABS: 20.0000 mg | ORAL_TABLET | ORAL | 0 refills | Status: DC

## 2021-03-27 MED ORDER — LEVOTHYROXINE SODIUM 175 MCG PO TABS: 175.0000 ug | ORAL_TABLET | Freq: Every day | ORAL | 0 refills | Status: DC

## 2021-03-27 MED ORDER — PHENTERMINE-TOPIRAMATE ER 11.25-69 MG PO CP24: ORAL_CAPSULE | ORAL | 0 refills | Status: DC

## 2021-04-01 NOTE — Progress Notes (Signed)
Chief Complaint:   OBESITY Isabella Larson is here to discuss her progress with her obesity treatment plan along with follow-up of her obesity related diagnoses. Isabella Larson is on the Category 4 Plan and states she is following her eating plan approximately 40% of the time. Isabella Larson states she is walks 10,000 - 12,000 steps for 1 week.  Today's visit was #: 32 Starting weight: 305 lbs Starting date: 04/18/2019 Today's weight: 283 lbs Today's date: 03/27/2021 Total lbs lost to date: 22 lbs Total lbs lost since last in-office visit: 7 lbs  Interim History: Isabella Larson is down 7 lbs since her last visit.  Subjective:   1. H/O gastric bypass Isabella Larson is taking multi vitamins with some restrictions.  2. Bilateral lower extremity edema Isabella Larson is taking Lasix.  She is having less foot swelling.   3. Other specified hypothyroidism Isabella Larson is taking Synthroid.  4. At risk for activity intolerance Isabella Larson is at risk for activity intolerance due to weather.  Assessment/Plan:   1. H/O gastric bypass Isabella Larson will continue to work on diet and exercise.  - Phentermine-Topiramate 11.25-69 MG CP24; 1 capsule daily in the am.  Dispense: 30 capsule; Refill: 0  2. Bilateral lower extremity edema Isabella Larson will continue Lasix, and we will refill for 1 month.  - furosemide (LASIX) 20 MG tablet; Take 1 tablet (20 mg total) by mouth every 3 (three) days.  Dispense: 30 tablet; Refill: 0  3. Other specified hypothyroidism Patient with long-standing hypothyroidism, on levothyroxine therapy. Shandelle appears euthyroid. We will refill levothyroxine for 1 month. Orders and follow up as documented in patient record.  Counseling Good thyroid control is important for overall health. Supratherapeutic thyroid levels are dangerous and will not improve weight loss results. The correct way to take levothyroxine is fasting, with water, separated by at least 30 minutes from breakfast, and separated by more than 4  hours from calcium, iron, multivitamins, acid reflux medications (PPIs).    - levothyroxine (SYNTHROID) 175 MCG tablet; Take 1 tablet (175 mcg total) by mouth daily before breakfast.  Dispense: 30 tablet; Refill: 0  4. At risk for activity intolerance Isabella Larson was given approximately 15 minutes of exercise intolerance counseling today. She is 41 y.o. female and has risk factors exercise intolerance including obesity. We discussed intensive lifestyle modifications today with an emphasis on specific weight loss instructions and strategies. Isabella Larson will continue steps per weather. Isabella Larson will slowly increase activity as tolerated.  Repetitive spaced learning was employed today to elicit superior memory formation and behavioral change.   5. Obesity with current BMI of 43.2 Isabella Larson is currently in the action stage of change. As such, her goal is to continue with weight loss efforts. She has agreed to the Category 4 Plan.   Isabella Larson will continue meal planning. Isabella Larson will continue taking Phentermine-Tompiramate, and we will refill for 1 month.  - Phentermine-topiramate 11.25-69 mg 1 capsule PO q morning. Dispense: 30 capsules; Refill: 0  Exercise goals: As is.  Behavioral modification strategies: increasing lean protein intake, decreasing simple carbohydrates, increasing vegetables, increasing water intake, decreasing eating out, no skipping meals, meal planning and cooking strategies, keeping healthy foods in the home, and planning for success.  Isabella Larson has agreed to follow-up with our clinic in 2 weeks. She was informed of the importance of frequent follow-up visits to maximize her success with intensive lifestyle modifications for her multiple health conditions.   Objective:   Blood pressure 106/72, pulse 81, temperature 98.1 F (36.7 C), height 5\' 8"  (1.727  m), weight 283 lb (128.4 kg), SpO2 98 %. Body mass index is 43.03 kg/m.  General: Cooperative, alert, well developed, in no  acute distress. HEENT: Conjunctivae and lids unremarkable. Cardiovascular: Regular rhythm.  Lungs: Normal work of breathing. Neurologic: No focal deficits.   Lab Results  Component Value Date   CREATININE 0.63 10/30/2020   BUN 16 10/30/2020   NA 142 10/30/2020   K 4.5 10/30/2020   CL 108 (H) 10/30/2020   CO2 22 10/30/2020   Lab Results  Component Value Date   ALT 15 10/30/2020   AST 16 10/30/2020   ALKPHOS 116 10/30/2020   BILITOT 0.2 10/30/2020   Lab Results  Component Value Date   HGBA1C 6.3 (H) 10/30/2020   HGBA1C 6.3 (H) 04/26/2020   HGBA1C 6.0 (H) 12/20/2019   HGBA1C 6.2 (H) 08/23/2019   HGBA1C 5.8 (H) 04/18/2019   Lab Results  Component Value Date   INSULIN 13.1 10/30/2020   INSULIN 14.8 04/26/2020   INSULIN 9.8 12/20/2019   INSULIN 15.4 08/23/2019   INSULIN 15.7 04/18/2019   Lab Results  Component Value Date   TSH 0.823 01/30/2021   Lab Results  Component Value Date   CHOL 171 04/26/2020   HDL 56 04/26/2020   LDLCALC 102 (H) 04/26/2020   TRIG 65 04/26/2020   Lab Results  Component Value Date   VD25OH 27.1 (L) 01/30/2021   VD25OH 27.2 (L) 12/10/2020   VD25OH 30.3 08/23/2020   Lab Results  Component Value Date   WBC 6.2 08/23/2019   HGB 12.3 08/23/2019   HCT 35.7 01/30/2021   MCV 84 08/23/2019   PLT 292 08/23/2019   Lab Results  Component Value Date   IRON 37 01/30/2021   TIBC 384 01/30/2021   FERRITIN 7 (L) 01/30/2021    Attestation Statements:   Reviewed by clinician on day of visit: allergies, medications, problem list, medical history, surgical history, family history, social history, and previous encounter notes.  I, Jackson Latino, RMA, am acting as Energy manager for [employer], DO.   I have reviewed the above documentation for accuracy and completeness, and I agree with the above. Corinna Capra, DO

## 2021-04-03 ENCOUNTER — Encounter (INDEPENDENT_AMBULATORY_CARE_PROVIDER_SITE_OTHER): Payer: Self-pay | Admitting: Bariatrics

## 2021-04-09 ENCOUNTER — Other Ambulatory Visit: Payer: Self-pay

## 2021-04-09 ENCOUNTER — Encounter (INDEPENDENT_AMBULATORY_CARE_PROVIDER_SITE_OTHER): Payer: Self-pay | Admitting: Bariatrics

## 2021-04-09 ENCOUNTER — Ambulatory Visit (INDEPENDENT_AMBULATORY_CARE_PROVIDER_SITE_OTHER): Payer: BC Managed Care – PPO | Admitting: Bariatrics

## 2021-04-09 VITALS — BP 117/82 | HR 72 | Temp 98.3°F | Ht 68.0 in | Wt 284.0 lb

## 2021-04-09 DIAGNOSIS — Z9189 Other specified personal risk factors, not elsewhere classified: Secondary | ICD-10-CM | POA: Diagnosis not present

## 2021-04-09 DIAGNOSIS — Z6841 Body Mass Index (BMI) 40.0 and over, adult: Secondary | ICD-10-CM

## 2021-04-09 DIAGNOSIS — E038 Other specified hypothyroidism: Secondary | ICD-10-CM | POA: Diagnosis not present

## 2021-04-09 DIAGNOSIS — R600 Localized edema: Secondary | ICD-10-CM | POA: Diagnosis not present

## 2021-04-09 MED ORDER — LEVOTHYROXINE SODIUM 175 MCG PO TABS: 175.0000 ug | ORAL_TABLET | Freq: Every day | ORAL | 0 refills | Status: DC

## 2021-04-09 MED ORDER — FUROSEMIDE 20 MG PO TABS: 20.0000 mg | ORAL_TABLET | ORAL | 0 refills | Status: DC

## 2021-04-16 NOTE — Progress Notes (Signed)
Chief Complaint:   OBESITY Isabella Larson is here to discuss her progress with her obesity treatment plan along with follow-up of her obesity related diagnoses. Isabella Larson is on the Category 4 Plan and states she is following her eating plan approximately 50% of the time. Isabella Larson states she is doing house work for 3-4 hours 1 times per week.  Today's visit was #: 32 Starting weight: 305 lbs Starting date: 04/18/2019 Today's weight: 284 lbs Today's date:04/09/2021 Total lbs lost to date: 21 lbs Total lbs lost since last in-office visit: 0  Interim History: Isabella Larson is up 1 lb from her last visit. She is finishing up her cycle. She states struggle with getting enough water.  Subjective:   1. Other specified hypothyroidism Isabella Larson is taking her medication as directed.  2. Edema of both lower extremities Isabella Larson's swelling is better controlled with Lasix.  3. At risk for dehydration Isabella Larson is at risk for dehydration due to weather, obesity and exercise.  Assessment/Plan:   1. Other specified hypothyroidism We will refill Synthroid 175 mg before breakfast for 1 month with no refills. Orders and follow up as documented in patient record.  Counseling Good thyroid control is important for overall health. Supratherapeutic thyroid levels are dangerous and will not improve weight loss results. Counseling: The correct way to take levothyroxine is fasting, with water, separated by at least 30 minutes from breakfast, and separated by more than 4 hours from calcium, iron, multivitamins, acid reflux medications (PPIs).    - levothyroxine (SYNTHROID) 175 MCG tablet; Take 1 tablet (175 mcg total) by mouth daily before breakfast.  Dispense: 30 tablet; Refill: 0  2. Edema of both lower extremities We will refill Lasix 20 mg for 1 month as needed with no refills.  - furosemide (LASIX) 20 MG tablet; Take 1 tablet (20 mg total) by mouth every 3 (three) days.  Dispense: 30 tablet; Refill: 0  3.  At risk for dehydration Isabella Larson was given approximately 15 minutes dehydration prevention counseling today. Isabella Larson is at risk for dehydration due to weight loss and current medication(s). She was encouraged to hydrate and monitor fluid status to avoid dehydration as well as weight loss plateaus.    4. Obesity with current BMI of 43.3 Isabella Larson is currently in the action stage of change. As such, her goal is to continue with weight loss efforts. She has agreed to the Category 4 Plan.   Isabella Larson will continue to meal plan. She will continue intentional eating. She will increase H2O. She will increase protein in the am.  Exercise goals:  As is, yard work.  Behavioral modification strategies: increasing lean protein intake, decreasing simple carbohydrates, increasing vegetables, increasing water intake, decreasing eating out, no skipping meals, meal planning and cooking strategies, keeping healthy foods in the home, and planning for success.  Isabella Larson has agreed to follow-up with our clinic in 2-3 weeks. She was informed of the importance of frequent follow-up visits to maximize her success with intensive lifestyle modifications for her multiple health conditions.   Objective:   Blood pressure 117/82, pulse 72, temperature 98.3 F (36.8 C), height 5\' 8"  (1.727 m), weight 284 lb (128.8 kg), SpO2 97 %. Body mass index is 43.18 kg/m.  General: Cooperative, alert, well developed, in no acute distress. HEENT: Conjunctivae and lids unremarkable. Cardiovascular: Regular rhythm.  Lungs: Normal work of breathing. Neurologic: No focal deficits.   Lab Results  Component Value Date   CREATININE 0.63 10/30/2020   BUN 16 10/30/2020   NA  142 10/30/2020   K 4.5 10/30/2020   CL 108 (H) 10/30/2020   CO2 22 10/30/2020   Lab Results  Component Value Date   ALT 15 10/30/2020   AST 16 10/30/2020   ALKPHOS 116 10/30/2020   BILITOT 0.2 10/30/2020   Lab Results  Component Value Date   HGBA1C 6.3  (H) 10/30/2020   HGBA1C 6.3 (H) 04/26/2020   HGBA1C 6.0 (H) 12/20/2019   HGBA1C 6.2 (H) 08/23/2019   HGBA1C 5.8 (H) 04/18/2019   Lab Results  Component Value Date   INSULIN 13.1 10/30/2020   INSULIN 14.8 04/26/2020   INSULIN 9.8 12/20/2019   INSULIN 15.4 08/23/2019   INSULIN 15.7 04/18/2019   Lab Results  Component Value Date   TSH 0.823 01/30/2021   Lab Results  Component Value Date   CHOL 171 04/26/2020   HDL 56 04/26/2020   LDLCALC 102 (H) 04/26/2020   TRIG 65 04/26/2020   Lab Results  Component Value Date   VD25OH 27.1 (L) 01/30/2021   VD25OH 27.2 (L) 12/10/2020   VD25OH 30.3 08/23/2020   Lab Results  Component Value Date   WBC 6.2 08/23/2019   HGB 12.3 08/23/2019   HCT 35.7 01/30/2021   MCV 84 08/23/2019   PLT 292 08/23/2019   Lab Results  Component Value Date   IRON 37 01/30/2021   TIBC 384 01/30/2021   FERRITIN 7 (L) 01/30/2021   Attestation Statements:   Reviewed by clinician on day of visit: allergies, medications, problem list, medical history, surgical history, family history, social history, and previous encounter notes.   I, Jackson Latino, RMA, am acting as Energy manager for [employer], DO.   I have reviewed the above documentation for accuracy and completeness, and I agree with the above. Corinna Capra, DO

## 2021-04-17 ENCOUNTER — Encounter (INDEPENDENT_AMBULATORY_CARE_PROVIDER_SITE_OTHER): Payer: Self-pay | Admitting: Bariatrics

## 2021-04-24 ENCOUNTER — Other Ambulatory Visit: Payer: Self-pay

## 2021-04-24 ENCOUNTER — Encounter (INDEPENDENT_AMBULATORY_CARE_PROVIDER_SITE_OTHER): Payer: Self-pay | Admitting: Bariatrics

## 2021-04-24 ENCOUNTER — Ambulatory Visit (INDEPENDENT_AMBULATORY_CARE_PROVIDER_SITE_OTHER): Payer: BC Managed Care – PPO | Admitting: Bariatrics

## 2021-04-24 VITALS — BP 116/72 | HR 61 | Temp 97.8°F | Ht 68.0 in | Wt 282.0 lb

## 2021-04-24 DIAGNOSIS — R600 Localized edema: Secondary | ICD-10-CM

## 2021-04-24 DIAGNOSIS — D508 Other iron deficiency anemias: Secondary | ICD-10-CM

## 2021-04-24 DIAGNOSIS — Z6841 Body Mass Index (BMI) 40.0 and over, adult: Secondary | ICD-10-CM

## 2021-04-24 DIAGNOSIS — E038 Other specified hypothyroidism: Secondary | ICD-10-CM | POA: Diagnosis not present

## 2021-04-24 DIAGNOSIS — Z9884 Bariatric surgery status: Secondary | ICD-10-CM | POA: Diagnosis not present

## 2021-04-24 DIAGNOSIS — Z9189 Other specified personal risk factors, not elsewhere classified: Secondary | ICD-10-CM | POA: Diagnosis not present

## 2021-04-24 MED ORDER — FUROSEMIDE 20 MG PO TABS: 20.0000 mg | ORAL_TABLET | ORAL | 0 refills | Status: DC

## 2021-04-25 LAB — ANEMIA PANEL
Ferritin: 6 ng/mL — ABNORMAL LOW (ref 15–150)
Folate, Hemolysate: 352 ng/mL
Folate, RBC: 957 ng/mL (ref >498)
Hematocrit: 36.8 % (ref 34.0–46.6)
Iron Saturation: 6 % — CL (ref 15–55)
Iron: 23 ug/dL — ABNORMAL LOW (ref 27–159)
Retic Ct Pct: 1.3 % (ref 0.6–2.6)
Total Iron Binding Capacity: 396 ug/dL (ref 250–450)
UIBC: 373 ug/dL (ref 131–425)
Vitamin B-12: 963 pg/mL (ref 232–1245)

## 2021-04-25 NOTE — Progress Notes (Signed)
Chief Complaint:   OBESITY Isabella Larson is here to discuss her progress with her obesity treatment plan along with follow-up of her obesity related diagnoses. Isabella Larson is on the Category 4 Plan and states she is following her eating plan approximately 15% of the time. Isabella Larson states she is doing 0 minutes 0 times per week.  Today's visit was #: 33 Starting weight: 305 lbs Starting date: 04/18/2019 Today's weight: 282 lbs Today's date: 04/24/2021 Total lbs lost to date: 23 lbs Total lbs lost since last in-office visit: 2 lbs  Interim History: Isabella Larson is down 2 lbs, since her last visit for which she was surprised as she has been moving and attending functions which has hindered her ability to follow the plan.  Subjective:   1. Other specified hypothyroidism Isabella Larson is taking Synthoid.  2. H/O gastric bypass Isabella Larson has some restrictions.  3. Other iron deficiency anemia Isabella Larson notes fatigue.  4. Edema of both lower extremities Isabella Larson notes edma and it is improving.  5. At risk for activity intolerance Isabella Larson is at risk for activity intolerance due to obesity, anemia and fatigue.  Assessment/Plan:   1. Other specified hypothyroidism Isabella Larson will continue medications.  Orders and follow up as documented in patient record.  Counseling Good thyroid control is important for overall health. Supratherapeutic thyroid levels are dangerous and will not improve weight loss results. Counseling: The correct way to take levothyroxine is fasting, with water, separated by at least 30 minutes from breakfast, and separated by more than 4 hours from calcium, iron, multivitamins, acid reflux medications (PPIs).    2. H/O gastric bypass Isabella Larson will keep protein high. We will check labs today.  - Anemia panel  3. Other iron deficiency anemia We will check labs.  - Anemia panel  4. Edema of both lower extremities We will refill Lasix 20 mg 60 tablet with no refills. Isabella Larson  will increase from every 3 days.  - furosemide (LASIX) 20 MG tablet; Take 1 tablet (20 mg total) by mouth every other day.  Dispense: 30 tablet; Refill: 0  5. At risk for activity intolerance Isabella Larson was given approximately 15 minutes of exercise intolerance counseling today. She is 40 y.o. female and has risk factors exercise intolerance including obesity. We discussed intensive lifestyle modifications today with an emphasis on specific weight loss instructions and strategies. Isabella Larson will slowly increase activity as tolerated.  Repetitive spaced learning was employed today to elicit superior memory formation and behavioral change.   6. Obesity with current BMI of 42.9 Isabella Larson is currently in the action stage of change. As such, her goal is to continue with weight loss efforts. She has agreed to the Category 4 Plan.   Isabella Larson will continue meal planning. She will continue intentional eating.  Exercise goals: No exercise has been prescribed at this time.  Behavioral modification strategies: increasing lean protein intake, decreasing simple carbohydrates, increasing vegetables, increasing water intake, decreasing eating out, no skipping meals, meal planning and cooking strategies, keeping healthy foods in the home, and planning for success.  Isabella Larson has agreed to follow-up with our clinic in 3 weeks. She was informed of the importance of frequent follow-up visits to maximize her success with intensive lifestyle modifications for her multiple health conditions.   Objective:   Blood pressure 116/72, pulse 61, temperature 97.8 F (36.6 C), height 5\' 8"  (1.727 m), weight 282 lb (127.9 kg), SpO2 97 %. Body mass index is 42.88 kg/m.  General: Cooperative, alert, well developed, in no acute  distress. HEENT: Conjunctivae and lids unremarkable. Cardiovascular: Regular rhythm.  Lungs: Normal work of breathing. Neurologic: No focal deficits.   Lab Results  Component Value Date    CREATININE 0.63 10/30/2020   BUN 16 10/30/2020   NA 142 10/30/2020   K 4.5 10/30/2020   CL 108 (H) 10/30/2020   CO2 22 10/30/2020   Lab Results  Component Value Date   ALT 15 10/30/2020   AST 16 10/30/2020   ALKPHOS 116 10/30/2020   BILITOT 0.2 10/30/2020   Lab Results  Component Value Date   HGBA1C 6.3 (H) 10/30/2020   HGBA1C 6.3 (H) 04/26/2020   HGBA1C 6.0 (H) 12/20/2019   HGBA1C 6.2 (H) 08/23/2019   HGBA1C 5.8 (H) 04/18/2019   Lab Results  Component Value Date   INSULIN 13.1 10/30/2020   INSULIN 14.8 04/26/2020   INSULIN 9.8 12/20/2019   INSULIN 15.4 08/23/2019   INSULIN 15.7 04/18/2019   Lab Results  Component Value Date   TSH 0.823 01/30/2021   Lab Results  Component Value Date   CHOL 171 04/26/2020   HDL 56 04/26/2020   LDLCALC 102 (H) 04/26/2020   TRIG 65 04/26/2020   Lab Results  Component Value Date   VD25OH 27.1 (L) 01/30/2021   VD25OH 27.2 (L) 12/10/2020   VD25OH 30.3 08/23/2020   Lab Results  Component Value Date   WBC 6.2 08/23/2019   HGB 12.3 08/23/2019   HCT 36.8 04/24/2021   MCV 84 08/23/2019   PLT 292 08/23/2019   Lab Results  Component Value Date   IRON 23 (L) 04/24/2021   TIBC 396 04/24/2021   FERRITIN 6 (L) 04/24/2021   Attestation Statements:   Reviewed by clinician on day of visit: allergies, medications, problem list, medical history, surgical history, family history, social history, and previous encounter notes.  I, Jackson Latino, RMA, am acting as Energy manager for [employer], DO.   I have reviewed the above documentation for accuracy and completeness, and I agree with the above. Corinna Capra, DO

## 2021-04-25 NOTE — Progress Notes (Signed)
Pt advised and has an appt on 05/06/21 @ 3:30.

## 2021-05-07 ENCOUNTER — Ambulatory Visit (INDEPENDENT_AMBULATORY_CARE_PROVIDER_SITE_OTHER): Payer: BC Managed Care – PPO | Admitting: Bariatrics

## 2021-05-08 ENCOUNTER — Encounter (INDEPENDENT_AMBULATORY_CARE_PROVIDER_SITE_OTHER): Payer: Self-pay | Admitting: Bariatrics

## 2021-05-08 ENCOUNTER — Ambulatory Visit (INDEPENDENT_AMBULATORY_CARE_PROVIDER_SITE_OTHER): Payer: BC Managed Care – PPO | Admitting: Bariatrics

## 2021-05-08 ENCOUNTER — Other Ambulatory Visit: Payer: Self-pay

## 2021-05-08 VITALS — BP 112/70 | HR 71 | Temp 97.3°F | Ht 62.0 in | Wt 287.0 lb

## 2021-05-08 DIAGNOSIS — Z6841 Body Mass Index (BMI) 40.0 and over, adult: Secondary | ICD-10-CM

## 2021-05-08 DIAGNOSIS — E038 Other specified hypothyroidism: Secondary | ICD-10-CM

## 2021-05-08 DIAGNOSIS — D508 Other iron deficiency anemias: Secondary | ICD-10-CM

## 2021-05-09 NOTE — Progress Notes (Signed)
Chief Complaint:   OBESITY Isabella Larson is here to discuss her progress with her obesity treatment plan along with follow-up of her obesity related diagnoses. Isabella Larson is on the Category 4 Plan and states she is following her eating plan approximately 30-40% of the time. Isabella Larson states she is doing 0 minutes 0 times per week.  Today's visit was #: 34 Starting weight: 305 lbs Starting date: 04/18/2019 Today's weight: 287 lbs Today's date: 05/08/2021 Total lbs lost to date: 18 lbs Total lbs lost since last in-office visit: 0  Interim History: Isabella Larson is up 5 lb since her last visit. She is fatigued and less energy. She will follow her diet. She is taking Qsymia.    Subjective:   1. Other iron deficiency anemia Isabella Larson has a history of iron infusion.  2. Other specified hypothyroidism Isabella Larson is currently taking Synthroid.  Assessment/Plan:   1. Other iron deficiency anemia Isabella Larson's doctor will put in referral for iron infusions in the near future. Orders and follow up as documented in patient record.  Counseling Iron is essential for our bodies to make red blood cells.  Reasons that someone may be deficient include: an iron-deficient diet (more likely in those following vegan or vegetarian diets), women with heavy menses, patients with GI disorders or poor absorption, patients that have had bariatric surgery, frequent blood donors, patients with cancer, and patients with heart disease.   An iron supplement has been recommended. This is found over-the-counter.  Iron-rich foods include dark leafy greens, red and white meats, eggs, seafood, and beans.   Certain foods and drinks prevent your body from absorbing iron properly. Avoid eating these foods in the same meal as iron-rich foods or with iron supplements. These foods include: coffee, black tea, and red wine; milk, dairy products, and foods that are high in calcium; beans and soybeans; whole grains.  Constipation can be a side  effect of iron supplementation. Increased water and fiber intake are helpful. Water goal: > 2 liters/day. Fiber goal: > 25 grams/day.   2. Other specified hypothyroidism Isabella Larson will continue Synthroid. Orders and follow up as documented in patient record.  Counseling Good thyroid control is important for overall health. Supratherapeutic thyroid levels are dangerous and will not improve weight loss results. Counseling: The correct way to take levothyroxine is fasting, with water, separated by at least 30 minutes from breakfast, and separated by more than 4 hours from calcium, iron, multivitamins, acid reflux medications (PPIs).    3. Obesity with current BMI of 43.7 Isabella Larson is currently in the action stage of change. As such, her goal is to continue with weight loss efforts. She has agreed to the Category 4 Plan.   Isabella Larson will continue meal planning. She will continue intentional eating. She will continue Qsymia.  Exercise goals: No exercise has been prescribed at this time.  Behavioral modification strategies: increasing lean protein intake, decreasing simple carbohydrates, increasing vegetables, increasing water intake, decreasing eating out, no skipping meals, meal planning and cooking strategies, keeping healthy foods in the home, and planning for success.  Isabella Larson has agreed to follow-up with our clinic in 2-3 weeks. She was informed of the importance of frequent follow-up visits to maximize her success with intensive lifestyle modifications for her multiple health conditions.   Objective:   Blood pressure 112/70, pulse 71, temperature (!) 97.3 F (36.3 C), height 5\' 2"  (1.575 m), weight 287 lb (130.2 kg), SpO2 100 %. Body mass index is 52.49 kg/m.  General: Cooperative, alert, well  developed, in no acute distress. HEENT: Conjunctivae and lids unremarkable. Cardiovascular: Regular rhythm.  Lungs: Normal work of breathing. Neurologic: No focal deficits.   Lab Results   Component Value Date   CREATININE 0.63 10/30/2020   BUN 16 10/30/2020   NA 142 10/30/2020   K 4.5 10/30/2020   CL 108 (H) 10/30/2020   CO2 22 10/30/2020   Lab Results  Component Value Date   ALT 15 10/30/2020   AST 16 10/30/2020   ALKPHOS 116 10/30/2020   BILITOT 0.2 10/30/2020   Lab Results  Component Value Date   HGBA1C 6.3 (H) 10/30/2020   HGBA1C 6.3 (H) 04/26/2020   HGBA1C 6.0 (H) 12/20/2019   HGBA1C 6.2 (H) 08/23/2019   HGBA1C 5.8 (H) 04/18/2019   Lab Results  Component Value Date   INSULIN 13.1 10/30/2020   INSULIN 14.8 04/26/2020   INSULIN 9.8 12/20/2019   INSULIN 15.4 08/23/2019   INSULIN 15.7 04/18/2019   Lab Results  Component Value Date   TSH 0.823 01/30/2021   Lab Results  Component Value Date   CHOL 171 04/26/2020   HDL 56 04/26/2020   LDLCALC 102 (H) 04/26/2020   TRIG 65 04/26/2020   Lab Results  Component Value Date   VD25OH 27.1 (L) 01/30/2021   VD25OH 27.2 (L) 12/10/2020   VD25OH 30.3 08/23/2020   Lab Results  Component Value Date   WBC 6.2 08/23/2019   HGB 12.3 08/23/2019   HCT 36.8 04/24/2021   MCV 84 08/23/2019   PLT 292 08/23/2019   Lab Results  Component Value Date   IRON 23 (L) 04/24/2021   TIBC 396 04/24/2021   FERRITIN 6 (L) 04/24/2021   Attestation Statements:   Reviewed by clinician on day of visit: allergies, medications, problem list, medical history, surgical history, family history, social history, and previous encounter notes.  Time spent on visit including pre-visit chart review and post-visit care and charting was 20 minutes.   I, Jackson Latino, RMA, am acting as Energy manager for [employer], DO.   I have reviewed the above documentation for accuracy and completeness, and I agree with the above. Corinna Capra, DO

## 2021-05-13 ENCOUNTER — Encounter (INDEPENDENT_AMBULATORY_CARE_PROVIDER_SITE_OTHER): Payer: Self-pay | Admitting: Bariatrics

## 2021-05-13 ENCOUNTER — Other Ambulatory Visit (HOSPITAL_COMMUNITY): Payer: Self-pay | Admitting: *Deleted

## 2021-05-14 ENCOUNTER — Other Ambulatory Visit: Payer: Self-pay

## 2021-05-14 ENCOUNTER — Encounter (HOSPITAL_COMMUNITY)
Admission: RE | Admit: 2021-05-14 | Discharge: 2021-05-14 | Disposition: A | Discharge status: Home / Self Care | Payer: BC Managed Care – PPO | Source: Ambulatory Visit | Attending: Family Medicine | Admitting: Family Medicine

## 2021-05-14 DIAGNOSIS — D508 Other iron deficiency anemias: Secondary | ICD-10-CM | POA: Insufficient documentation

## 2021-05-14 MED ORDER — SODIUM CHLORIDE 0.9 % IV SOLN
510.0000 mg | INTRAVENOUS | Status: DC
  Administered 2021-05-14: 510 mg via INTRAVENOUS
  Filled 2021-05-14: qty 510

## 2021-05-20 ENCOUNTER — Other Ambulatory Visit (HOSPITAL_COMMUNITY): Payer: Self-pay | Admitting: *Deleted

## 2021-05-21 ENCOUNTER — Encounter (HOSPITAL_COMMUNITY)
Admission: RE | Admit: 2021-05-21 | Discharge: 2021-05-21 | Disposition: A | Discharge status: Home / Self Care | Payer: BC Managed Care – PPO | Source: Ambulatory Visit | Attending: Family Medicine | Admitting: Family Medicine

## 2021-05-21 ENCOUNTER — Other Ambulatory Visit: Payer: Self-pay

## 2021-05-21 DIAGNOSIS — D508 Other iron deficiency anemias: Secondary | ICD-10-CM | POA: Diagnosis not present

## 2021-05-21 MED ORDER — SODIUM CHLORIDE 0.9 % IV SOLN
510.0000 mg | INTRAVENOUS | Status: DC
  Administered 2021-05-21: 510 mg via INTRAVENOUS
  Filled 2021-05-21: qty 510

## 2021-05-28 ENCOUNTER — Ambulatory Visit (INDEPENDENT_AMBULATORY_CARE_PROVIDER_SITE_OTHER): Payer: BC Managed Care – PPO | Admitting: Bariatrics

## 2021-06-03 ENCOUNTER — Ambulatory Visit (INDEPENDENT_AMBULATORY_CARE_PROVIDER_SITE_OTHER): Payer: BC Managed Care – PPO | Admitting: Bariatrics

## 2021-06-03 ENCOUNTER — Encounter (INDEPENDENT_AMBULATORY_CARE_PROVIDER_SITE_OTHER): Payer: Self-pay | Admitting: Bariatrics

## 2021-06-03 ENCOUNTER — Other Ambulatory Visit: Payer: Self-pay

## 2021-06-03 VITALS — BP 127/84 | HR 79 | Temp 97.8°F | Ht 68.0 in | Wt 290.0 lb

## 2021-06-03 DIAGNOSIS — E038 Other specified hypothyroidism: Secondary | ICD-10-CM | POA: Diagnosis not present

## 2021-06-03 DIAGNOSIS — Z9884 Bariatric surgery status: Secondary | ICD-10-CM

## 2021-06-03 DIAGNOSIS — L659 Nonscarring hair loss, unspecified: Secondary | ICD-10-CM

## 2021-06-03 DIAGNOSIS — Z6841 Body Mass Index (BMI) 40.0 and over, adult: Secondary | ICD-10-CM

## 2021-06-03 DIAGNOSIS — Z9189 Other specified personal risk factors, not elsewhere classified: Secondary | ICD-10-CM

## 2021-06-03 MED ORDER — PHENTERMINE-TOPIRAMATE ER 11.25-69 MG PO CP24: ORAL_CAPSULE | ORAL | 0 refills | Status: DC

## 2021-06-04 ENCOUNTER — Encounter (INDEPENDENT_AMBULATORY_CARE_PROVIDER_SITE_OTHER): Payer: Self-pay | Admitting: Bariatrics

## 2021-06-04 LAB — TSH+T4F+T3FREE
Free T4: 1.39 ng/dL (ref 0.82–1.77)
T3, Free: 3.2 pg/mL (ref 2.0–4.4)
TSH: 0.049 u[IU]/mL — ABNORMAL LOW (ref 0.450–4.500)

## 2021-06-04 LAB — VITAMIN D 25 HYDROXY (VIT D DEFICIENCY, FRACTURES): Vit D, 25-Hydroxy: 38.7 ng/mL (ref 30.0–100.0)

## 2021-06-04 NOTE — Progress Notes (Signed)
Chief Complaint:   OBESITY Isabella Larson is here to discuss her progress with her obesity treatment plan along with follow-up of her obesity related diagnoses. [name] is on the Category 4 Plan and states she is following her eating plan approximately 40% of the time. [name] states she is doing cardio for 30 minutes 4 times per week.  Today's visit was #: 35 Starting weight: 305 lbs Starting date: 04/18/2019 Today's weight: 290 lbs Today's date: 06/03/2021 Total lbs lost to date: 15 lbs Total lbs lost since last in-office visit: 0  Interim History: Isabella Larson is up 3 lbs since her last visit. She recently had an iron infusion. Her fatigue is improved, but still has some fatigue.   Subjective:   1. Other specified hypothyroidism Eutha missed her thyroid medication this AM. She notes hair loss and fatigue.  2. H/O gastric bypass She still has some restriction.   3. Hair loss Isabella Larson has iron infusion 3/2 weeks x 2. Her last TSH level was 0.823  4. At risk for malnutrition Isabella Larson is at risk for malnutrition due to history of gastric bypass.  Assessment/Plan:   1. Other specified hypothyroidism We will check thyroid panel. She will call when she needs her Synthroid and the dose can be based on her labs drawn today. Orders and follow up as documented in patient record.  Counseling Good thyroid control is important for overall health. Supratherapeutic thyroid levels are dangerous and will not improve weight loss results. Counseling: The correct way to take levothyroxine is fasting, with water, separated by at least 30 minutes from breakfast, and separated by more than 4 hours from calcium, iron, multivitamins, acid reflux medications (PPIs).   - TSH+T4F+T3Free  2. H/O gastric bypass We will check labs today.   - VITAMIN D 25 Hydroxy (Vit-D Deficiency, Fractures) - Vitamin B1 - Vitamin B2, Whole Blood  3. Hair loss We will check thyroid panel, Vitamin D, B1 and B2  today.   - TSH+T4F+T3Free - Vitamin B1  4. At risk for malnutrition Laycie was given approximately 15 minutes of counseling today regarding prevention of malnutrition and ways to meet macronutrient goals..    5. Obesity with current BMI of 44.1 Isabella Larson is currently in the action stage of change. As such, her goal is to continue with weight loss efforts. She has agreed to the Category 4 Plan.   Isabella Larson will continue meal planning. We will refill Phenetermine-Topiramate 11.25/69 1 capsule in AM for 1 month with no refills.  - Phentermine-Topiramate 11.25-69 MG CP24; 1 capsule daily in the am.  Dispense: 30 capsule; Refill: 0  Exercise goals:  As is.  Behavioral modification strategies: increasing lean protein intake, decreasing simple carbohydrates, increasing vegetables, increasing water intake, decreasing eating out, no skipping meals, meal planning and cooking strategies, keeping healthy foods in the home, and planning for success.  Isabella Larson has agreed to follow-up with our clinic in 3-4 weeks. She was informed of the importance of frequent follow-up visits to maximize her success with intensive lifestyle modifications for her multiple health conditions.   Isabella Larson was informed we would discuss her lab results at her next visit unless there is a critical issue that needs to be addressed sooner. Isabella Larson agreed to keep her next visit at the agreed upon time to discuss these results.  Objective:   Blood pressure 127/84, pulse 79, temperature 97.8 F (36.6 C), height 5\' 8"  (1.727 m), weight 290 lb (131.5 kg), SpO2 97 %. Body mass index is 44.09  kg/m.  General: Cooperative, alert, well developed, in no acute distress. HEENT: Conjunctivae and lids unremarkable. Cardiovascular: Regular rhythm.  Lungs: Normal work of breathing. Neurologic: No focal deficits.   Lab Results  Component Value Date   CREATININE 0.63 10/30/2020   BUN 16 10/30/2020   NA 142 10/30/2020   K 4.5  10/30/2020   CL 108 (H) 10/30/2020   CO2 22 10/30/2020   Lab Results  Component Value Date   ALT 15 10/30/2020   AST 16 10/30/2020   ALKPHOS 116 10/30/2020   BILITOT 0.2 10/30/2020   Lab Results  Component Value Date   HGBA1C 6.3 (H) 10/30/2020   HGBA1C 6.3 (H) 04/26/2020   HGBA1C 6.0 (H) 12/20/2019   HGBA1C 6.2 (H) 08/23/2019   HGBA1C 5.8 (H) 04/18/2019   Lab Results  Component Value Date   INSULIN 13.1 10/30/2020   INSULIN 14.8 04/26/2020   INSULIN 9.8 12/20/2019   INSULIN 15.4 08/23/2019   INSULIN 15.7 04/18/2019   Lab Results  Component Value Date   TSH 0.049 (L) 06/03/2021   Lab Results  Component Value Date   CHOL 171 04/26/2020   HDL 56 04/26/2020   LDLCALC 102 (H) 04/26/2020   TRIG 65 04/26/2020   Lab Results  Component Value Date   VD25OH 38.7 06/03/2021   VD25OH 27.1 (L) 01/30/2021   VD25OH 27.2 (L) 12/10/2020   Lab Results  Component Value Date   WBC 6.2 08/23/2019   HGB 12.3 08/23/2019   HCT 36.8 04/24/2021   MCV 84 08/23/2019   PLT 292 08/23/2019   Lab Results  Component Value Date   IRON 23 (L) 04/24/2021   TIBC 396 04/24/2021   FERRITIN 6 (L) 04/24/2021   Attestation Statements:   Reviewed by clinician on day of visit: allergies, medications, problem list, medical history, surgical history, family history, social history, and previous encounter notes.  I, Jackson Latino, RMA, am acting as Energy manager for [employer], DO.   I have reviewed the above documentation for accuracy and completeness, and I agree with the above. Corinna Capra, DO

## 2021-06-06 ENCOUNTER — Other Ambulatory Visit (INDEPENDENT_AMBULATORY_CARE_PROVIDER_SITE_OTHER): Payer: Self-pay | Admitting: Bariatrics

## 2021-06-06 MED ORDER — LEVOTHYROXINE SODIUM 150 MCG PO TABS: 150.0000 ug | ORAL_TABLET | Freq: Every day | ORAL | 1 refills | Status: DC

## 2021-06-06 NOTE — Progress Notes (Signed)
Pt adivsed, okay to send Rx in

## 2021-06-18 ENCOUNTER — Encounter (INDEPENDENT_AMBULATORY_CARE_PROVIDER_SITE_OTHER): Payer: Self-pay

## 2021-06-19 ENCOUNTER — Other Ambulatory Visit: Payer: Self-pay

## 2021-06-19 ENCOUNTER — Ambulatory Visit (INDEPENDENT_AMBULATORY_CARE_PROVIDER_SITE_OTHER): Payer: BC Managed Care – PPO | Admitting: Bariatrics

## 2021-06-19 ENCOUNTER — Encounter (INDEPENDENT_AMBULATORY_CARE_PROVIDER_SITE_OTHER): Payer: Self-pay | Admitting: Bariatrics

## 2021-06-19 VITALS — BP 105/73 | HR 64 | Temp 98.1°F | Ht 68.0 in | Wt 287.0 lb

## 2021-06-19 DIAGNOSIS — Z6841 Body Mass Index (BMI) 40.0 and over, adult: Secondary | ICD-10-CM

## 2021-06-19 DIAGNOSIS — E038 Other specified hypothyroidism: Secondary | ICD-10-CM | POA: Diagnosis not present

## 2021-06-19 DIAGNOSIS — E559 Vitamin D deficiency, unspecified: Secondary | ICD-10-CM

## 2021-06-20 ENCOUNTER — Encounter (INDEPENDENT_AMBULATORY_CARE_PROVIDER_SITE_OTHER): Payer: Self-pay | Admitting: Bariatrics

## 2021-06-20 NOTE — Progress Notes (Signed)
Chief Complaint:   OBESITY Isabella Larson is here to discuss her progress with her obesity treatment plan along with follow-up of her obesity related diagnoses. Aija is on the Category 4 Plan and states she is following her eating plan approximately 45% of the time. Isabella Larson states she is doing 0 minutes 0 times per week.  Today's visit was #: 36 Starting weight: 305 lbs Starting date: 04/18/2019 Today's weight: 287 lbs Today's date: 06/19/2021 Total lbs lost to date: 18 lbs Total lbs lost since last in-office visit: 3 lbs  Interim History: Isabella Larson is down 3 lbs from her last visit. She is adding in the protein shakes. She states that her appetite is normal.  Subjective:   1. Vitamin D deficiency She is currently taking OTC vitamin D each day. She denies nausea, vomiting or muscle weakness. Her last Vitamin D level was 38.7.  2. Other specified hypothyroidism  She decreased her  Synthroid on 06/06/2021 as directed, as her last TSH level has decreased to 0.049.  Assessment/Plan:   1. Vitamin D deficiency Low Vitamin D level contributes to fatigue and are associated with obesity, breast, and colon cancer. Kiari will continue OTC Multi-Vitamin daily and she will follow-up for routine testing of Vitamin D, at least 2-3 times per year to avoid over-replacement.  2. Other specified hypothyroidism Isabella Larson will continue medication. We will recheck thyroid panel in the near future. Orders and follow up as documented in patient record.  Counseling Good thyroid control is important for overall health. Supratherapeutic thyroid levels are dangerous and will not improve weight loss results. Counseling: The correct way to take levothyroxine is fasting, with water, separated by at least 30 minutes from breakfast, and separated by more than 4 hours from calcium, iron, multivitamins, acid reflux medications (PPIs).    3. Obesity with current BMI of 43.7 Isabella Larson is currently in the action  stage of change. As such, her goal is to continue with weight loss efforts. She has agreed to the Category 4 Plan.   Isabella Larson will continue meal planning. We will review labs from 06/03/2021.  Exercise goals: No exercise has been prescribed at this time.  Behavioral modification strategies: increasing lean protein intake, decreasing simple carbohydrates, increasing vegetables, increasing water intake, decreasing eating out, no skipping meals, meal planning and cooking strategies, keeping healthy foods in the home, and planning for success.  Isabella Larson has agreed to follow-up with our clinic in 4 weeks. She was informed of the importance of frequent follow-up visits to maximize her success with intensive lifestyle modifications for her multiple health conditions.   Objective:   Blood pressure 105/73, pulse 64, temperature 98.1 F (36.7 C), height 5\' 8"  (1.727 m), weight 287 lb (130.2 kg), SpO2 100 %. Body mass index is 43.64 kg/m.  General: Cooperative, alert, well developed, in no acute distress. HEENT: Conjunctivae and lids unremarkable. Cardiovascular: Regular rhythm.  Lungs: Normal work of breathing. Neurologic: No focal deficits.   Lab Results  Component Value Date   CREATININE 0.63 10/30/2020   BUN 16 10/30/2020   NA 142 10/30/2020   K 4.5 10/30/2020   CL 108 (H) 10/30/2020   CO2 22 10/30/2020   Lab Results  Component Value Date   ALT 15 10/30/2020   AST 16 10/30/2020   ALKPHOS 116 10/30/2020   BILITOT 0.2 10/30/2020   Lab Results  Component Value Date   HGBA1C 6.3 (H) 10/30/2020   HGBA1C 6.3 (H) 04/26/2020   HGBA1C 6.0 (H) 12/20/2019  HGBA1C 6.2 (H) 08/23/2019   HGBA1C 5.8 (H) 04/18/2019   Lab Results  Component Value Date   INSULIN 13.1 10/30/2020   INSULIN 14.8 04/26/2020   INSULIN 9.8 12/20/2019   INSULIN 15.4 08/23/2019   INSULIN 15.7 04/18/2019   Lab Results  Component Value Date   TSH 0.049 (L) 06/03/2021   Lab Results  Component Value Date    CHOL 171 04/26/2020   HDL 56 04/26/2020   LDLCALC 102 (H) 04/26/2020   TRIG 65 04/26/2020   Lab Results  Component Value Date   VD25OH 38.7 06/03/2021   VD25OH 27.1 (L) 01/30/2021   VD25OH 27.2 (L) 12/10/2020   Lab Results  Component Value Date   WBC 6.2 08/23/2019   HGB 12.3 08/23/2019   HCT 36.8 04/24/2021   MCV 84 08/23/2019   PLT 292 08/23/2019   Lab Results  Component Value Date   IRON 23 (L) 04/24/2021   TIBC 396 04/24/2021   FERRITIN 6 (L) 04/24/2021   Attestation Statements:   Reviewed by clinician on day of visit: allergies, medications, problem list, medical history, surgical history, family history, social history, and previous encounter notes.  I, Jackson Latino, RMA, am acting as Energy manager for [employer], DO.   I have reviewed the above documentation for accuracy and completeness, and I agree with the above. Corinna Capra, DO

## 2021-06-24 ENCOUNTER — Other Ambulatory Visit (INDEPENDENT_AMBULATORY_CARE_PROVIDER_SITE_OTHER): Payer: Self-pay | Admitting: Bariatrics

## 2021-06-24 DIAGNOSIS — R600 Localized edema: Secondary | ICD-10-CM

## 2021-07-01 ENCOUNTER — Ambulatory Visit (INDEPENDENT_AMBULATORY_CARE_PROVIDER_SITE_OTHER): Payer: BC Managed Care – PPO | Admitting: Bariatrics

## 2021-07-03 ENCOUNTER — Encounter (INDEPENDENT_AMBULATORY_CARE_PROVIDER_SITE_OTHER): Payer: Self-pay

## 2021-07-03 ENCOUNTER — Other Ambulatory Visit (INDEPENDENT_AMBULATORY_CARE_PROVIDER_SITE_OTHER): Payer: Self-pay | Admitting: Bariatrics

## 2021-07-03 NOTE — Telephone Encounter (Signed)
Message sent to pt-[facility] 

## 2021-07-03 NOTE — Telephone Encounter (Signed)
Last seen by Dr. Brown. 

## 2021-07-22 ENCOUNTER — Encounter (INDEPENDENT_AMBULATORY_CARE_PROVIDER_SITE_OTHER): Payer: Self-pay | Admitting: Bariatrics

## 2021-07-22 ENCOUNTER — Other Ambulatory Visit: Payer: Self-pay

## 2021-07-22 ENCOUNTER — Ambulatory Visit (INDEPENDENT_AMBULATORY_CARE_PROVIDER_SITE_OTHER): Payer: BC Managed Care – PPO | Admitting: Bariatrics

## 2021-07-22 VITALS — BP 129/82 | HR 62 | Temp 98.4°F | Ht 68.0 in | Wt 292.0 lb

## 2021-07-22 DIAGNOSIS — E038 Other specified hypothyroidism: Secondary | ICD-10-CM | POA: Diagnosis not present

## 2021-07-22 DIAGNOSIS — R600 Localized edema: Secondary | ICD-10-CM | POA: Diagnosis not present

## 2021-07-22 DIAGNOSIS — D508 Other iron deficiency anemias: Secondary | ICD-10-CM | POA: Diagnosis not present

## 2021-07-22 DIAGNOSIS — R7303 Prediabetes: Secondary | ICD-10-CM

## 2021-07-22 DIAGNOSIS — Z6841 Body Mass Index (BMI) 40.0 and over, adult: Secondary | ICD-10-CM

## 2021-07-22 MED ORDER — PHENTERMINE-TOPIRAMATE ER 11.25-69 MG PO CP24: ORAL_CAPSULE | ORAL | 0 refills | Status: DC

## 2021-07-22 MED ORDER — LEVOTHYROXINE SODIUM 150 MCG PO TABS: 150.0000 ug | ORAL_TABLET | Freq: Every day | ORAL | 1 refills | Status: DC

## 2021-07-22 MED ORDER — FUROSEMIDE 20 MG PO TABS: 20.0000 mg | ORAL_TABLET | ORAL | 0 refills | Status: DC

## 2021-07-22 NOTE — Progress Notes (Signed)
Chief Complaint:   OBESITY Isabella Larson is here to discuss her progress with her obesity treatment plan along with follow-up of her obesity related diagnoses. Isabella Larson is on the Category 4 Plan and states she is following her eating plan approximately 30% of the time. Isabella Larson states she is doing 0 minutes 0 times per week.  Today's visit was #: 37 Starting weight: 305 lbs Starting date: 04/18/2019 Today's weight: 292 lbs Today's date: 07/22/2021 Total lbs lost to date: 13 lbs Total lbs lost since last in-office visit: 5 lbs  Interim History: Isabella Larson is down an additional 5 lbs since her last visit.   Subjective:   1. Edema of both lower extremities Isabella Larson is currently taking Lasix.   2. Other specified hypothyroidism Isabella Larson is currently taking Synthroid.   3. Prediabetes Isabella Larson's last A1C level was 6.3. Her last Insulin resistant level was 13.1.  4. Other iron deficiency anemia Isabella Larson notes more energy.  Assessment/Plan:   1. Edema of both lower extremities We will refill Lasix 20 mg for 1 month with no refills.   - furosemide (LASIX) 20 MG tablet; Take 1 tablet (20 mg total) by mouth every other day.  Dispense: 30 tablet; Refill: 0  2. Other specified hypothyroidism We will refill Synthroid 150 mg for 1 month with no refills. Orders and follow up as documented in patient record.  Counseling Good thyroid control is important for overall health. Supratherapeutic thyroid levels are dangerous and will not improve weight loss results. Counseling: The correct way to take levothyroxine is fasting, with water, separated by at least 30 minutes from breakfast, and separated by more than 4 hours from calcium, iron, multivitamins, acid reflux medications (PPIs).   - levothyroxine (SYNTHROID) 150 MCG tablet; Take 1 tablet (150 mcg total) by mouth daily.  Dispense: 30 tablet; Refill: 1 - Comprehensive metabolic panel  3. Prediabetes Isabella Larson will continue to work on  weight loss, exercise, and decreasing simple carbohydrates to help decrease the risk of diabetes. We will check labs today.   - Insulin, random - Hemoglobin A1c  4. Other iron deficiency anemia Orders and follow up as documented in patient record. We will check labs today.   Counseling Iron is essential for our bodies to make red blood cells.  Reasons that someone may be deficient include: an iron-deficient diet (more likely in those following vegan or vegetarian diets), women with heavy menses, patients with GI disorders or poor absorption, patients that have had bariatric surgery, frequent blood donors, patients with cancer, and patients with heart disease.   An iron supplement has been recommended.  Iron-rich foods include dark leafy greens, red and white meats, eggs, seafood, and beans.   Certain foods and drinks prevent your body from absorbing iron properly. Avoid eating these foods in the same meal as iron-rich foods or with iron supplements. These foods include: coffee, black tea, and red wine; milk, dairy products, and foods that are high in calcium; beans and soybeans; whole grains.  Constipation can be a side effect of iron supplementation. Increased water and fiber intake are helpful. Water goal: > 2 liters/day. Fiber goal: > 25 grams/day.  - Anemia panel - Comprehensive metabolic panel  5. Obesity with current BMI of 44.1 Isabella Larson is currently in the action stage of change. As such, her goal is to continue with weight loss efforts. She has agreed to the Category 4 Plan.   We will refill Qsymia 11.25/69 mg for 1 month with no refills.   -  Phentermine-Topiramate 11.25-69 MG CP24; 1 capsule daily in the am.  Dispense: 30 capsule; Refill: 0  Exercise goals: No exercise has been prescribed at this time.  Behavioral modification strategies: increasing lean protein intake, decreasing simple carbohydrates, increasing vegetables, increasing water intake, decreasing eating out, no  skipping meals, meal planning and cooking strategies, keeping healthy foods in the home, and planning for success.  Isabella Larson has agreed to follow-up with our clinic in 4 weeks. She was informed of the importance of frequent follow-up visits to maximize her success with intensive lifestyle modifications for her multiple health conditions.   Isabella Larson was informed we would discuss her lab results at her next visit unless there is a critical issue that needs to be addressed sooner. Isabella Larson agreed to keep her next visit at the agreed upon time to discuss these results.  Objective:   Pulse 62, temperature 98.4 F (36.9 C), height 5\' 8"  (1.727 m), weight 292 lb (132.5 kg), SpO2 99 %. Body mass index is 44.4 kg/m.  General: Cooperative, alert, well developed, in no acute distress. HEENT: Conjunctivae and lids unremarkable. Cardiovascular: Regular rhythm.  Lungs: Normal work of breathing. Neurologic: No focal deficits.   Lab Results  Component Value Date   CREATININE 0.63 10/30/2020   BUN 16 10/30/2020   NA 142 10/30/2020   K 4.5 10/30/2020   CL 108 (H) 10/30/2020   CO2 22 10/30/2020   Lab Results  Component Value Date   ALT 15 10/30/2020   AST 16 10/30/2020   ALKPHOS 116 10/30/2020   BILITOT 0.2 10/30/2020   Lab Results  Component Value Date   HGBA1C 6.3 (H) 10/30/2020   HGBA1C 6.3 (H) 04/26/2020   HGBA1C 6.0 (H) 12/20/2019   HGBA1C 6.2 (H) 08/23/2019   HGBA1C 5.8 (H) 04/18/2019   Lab Results  Component Value Date   INSULIN 13.1 10/30/2020   INSULIN 14.8 04/26/2020   INSULIN 9.8 12/20/2019   INSULIN 15.4 08/23/2019   INSULIN 15.7 04/18/2019   Lab Results  Component Value Date   TSH 0.049 (L) 06/03/2021   Lab Results  Component Value Date   CHOL 171 04/26/2020   HDL 56 04/26/2020   LDLCALC 102 (H) 04/26/2020   TRIG 65 04/26/2020   Lab Results  Component Value Date   VD25OH 38.7 06/03/2021   VD25OH 27.1 (L) 01/30/2021   VD25OH 27.2 (L) 12/10/2020   Lab  Results  Component Value Date   WBC 6.2 08/23/2019   HGB 12.3 08/23/2019   HCT 36.8 04/24/2021   MCV 84 08/23/2019   PLT 292 08/23/2019   Lab Results  Component Value Date   IRON 23 (L) 04/24/2021   TIBC 396 04/24/2021   FERRITIN 6 (L) 04/24/2021   Attestation Statements:   Reviewed by clinician on day of visit: allergies, medications, problem list, medical history, surgical history, family history, social history, and previous encounter notes.  I, 06/24/2021, RMA, am acting as Jackson Latino for Energy manager, DO.   I have reviewed the above documentation for accuracy and completeness, and I agree with the above. [employer], DO

## 2021-07-23 LAB — ANEMIA PANEL
Ferritin: 44 ng/mL (ref 15–150)
Folate, Hemolysate: 320 ng/mL
Folate, RBC: 838 ng/mL (ref >498)
Hematocrit: 38.2 % (ref 34.0–46.6)
Iron Saturation: 26 % (ref 15–55)
Iron: 81 ug/dL (ref 27–159)
Retic Ct Pct: 1 % (ref 0.6–2.6)
Total Iron Binding Capacity: 310 ug/dL (ref 250–450)
UIBC: 229 ug/dL (ref 131–425)
Vitamin B-12: 1093 pg/mL (ref 232–1245)

## 2021-07-23 LAB — INSULIN, RANDOM: INSULIN: 8.5 u[IU]/mL (ref 2.6–24.9)

## 2021-07-23 LAB — COMPREHENSIVE METABOLIC PANEL
ALT: 19 IU/L (ref 0–32)
AST: 22 IU/L (ref 0–40)
Albumin/Globulin Ratio: 1.9 (ref 1.2–2.2)
Albumin: 4.1 g/dL (ref 3.8–4.8)
Alkaline Phosphatase: 98 IU/L (ref 44–121)
BUN/Creatinine Ratio: 20 (ref 9–23)
BUN: 13 mg/dL (ref 6–20)
Bilirubin Total: 0.2 mg/dL (ref 0.0–1.2)
CO2: 24 mmol/L (ref 20–29)
Calcium: 8.9 mg/dL (ref 8.7–10.2)
Chloride: 105 mmol/L (ref 96–106)
Creatinine, Ser: 0.64 mg/dL (ref 0.57–1.00)
Globulin, Total: 2.2 g/dL (ref 1.5–4.5)
Glucose: 99 mg/dL (ref 70–99)
Potassium: 5 mmol/L (ref 3.5–5.2)
Sodium: 141 mmol/L (ref 134–144)
Total Protein: 6.3 g/dL (ref 6.0–8.5)
eGFR: 115 mL/min/{1.73_m2} (ref >59)

## 2021-07-23 LAB — HEMOGLOBIN A1C
Est. average glucose Bld gHb Est-mCnc: 126 mg/dL
Hgb A1c MFr Bld: 6 % — ABNORMAL HIGH (ref 4.8–5.6)

## 2021-08-13 ENCOUNTER — Ambulatory Visit (INDEPENDENT_AMBULATORY_CARE_PROVIDER_SITE_OTHER): Payer: BC Managed Care – PPO | Admitting: Bariatrics

## 2021-08-13 ENCOUNTER — Other Ambulatory Visit: Payer: Self-pay

## 2021-08-13 ENCOUNTER — Encounter (INDEPENDENT_AMBULATORY_CARE_PROVIDER_SITE_OTHER): Payer: Self-pay | Admitting: Bariatrics

## 2021-08-13 VITALS — BP 120/78 | HR 69 | Temp 97.9°F | Ht 68.0 in | Wt 290.0 lb

## 2021-08-13 DIAGNOSIS — D508 Other iron deficiency anemias: Secondary | ICD-10-CM

## 2021-08-13 DIAGNOSIS — R7303 Prediabetes: Secondary | ICD-10-CM | POA: Diagnosis not present

## 2021-08-13 DIAGNOSIS — Z6841 Body Mass Index (BMI) 40.0 and over, adult: Secondary | ICD-10-CM

## 2021-08-19 ENCOUNTER — Other Ambulatory Visit (INDEPENDENT_AMBULATORY_CARE_PROVIDER_SITE_OTHER): Payer: Self-pay | Admitting: Bariatrics

## 2021-08-19 DIAGNOSIS — E038 Other specified hypothyroidism: Secondary | ICD-10-CM

## 2021-08-19 NOTE — Progress Notes (Signed)
Chief Complaint:   OBESITY Isabella Larson is here to discuss her progress with her obesity treatment plan along with follow-up of her obesity related diagnoses. Isabella Larson is on the Category 4 Plan and states she is following her eating plan approximately 25% of the time. Isabella Larson states she is doing 0 minutes 0 times per week.  Today's visit was #: 38 Starting weight: 305 lbs Starting date: 04/18/2019 Today's weight: 290 lbs Today's date:08/13/2021 Total lbs lost to date: 15 lbs Total lbs lost since last in-office visit: 2 lbs  Interim History: Isabella Larson is down 2 lbs since her last visit.  Subjective:   1. Prediabetes Isabella Larson states less fatigue. Her last A1C was 6.0. It was decrease from 6.3.  2. Other iron deficiency anemia Isabella Larson is doing well. She had a recent iron infusion.   Assessment/Plan:   1. Prediabetes Isabella Larson will continue to work on the plan and activities. She will work on weight loss, exercise, and decreasing simple carbohydrates to help decrease the risk of diabetes.   2. Other iron deficiency anemia Orders and follow up as documented in patient record.  and primary care provider will continue to monitor her lab work.   Counseling Iron is essential for our bodies to make red blood cells.  Reasons that someone may be deficient include: an iron-deficient diet (more likely in those following vegan or vegetarian diets), women with heavy menses, patients with GI disorders or poor absorption, patients that have had bariatric surgery, frequent blood donors, patients with cancer, and patients with heart disease.   An iron supplement has been recommended. This is found over-the-counter.  Iron-rich foods include dark leafy greens, red and white meats, eggs, seafood, and beans.   Certain foods and drinks prevent your body from absorbing iron properly. Avoid eating these foods in the same meal as iron-rich foods or with iron supplements. These foods include: coffee, black  tea, and red wine; milk, dairy products, and foods that are high in calcium; beans and soybeans; whole grains.  Constipation can be a side effect of iron supplementation. Increased water and fiber intake are helpful. Water goal: > 2 liters/day. Fiber goal: > 25 grams/day.   3. Obesity with current BMI of 44.2 Isabella Larson is currently in the action stage of change. As such, her goal is to continue with weight loss efforts. She has agreed to the Category 4 Plan.   Isabella Larson will continue meal planning. She will continue intentional eating. We reviewed labs from 07/22/2021 CMP, iron, anemia panel, B12, A1C and insulin.  Exercise goals: No exercise has been prescribed at this time.  Behavioral modification strategies: increasing lean protein intake, decreasing simple carbohydrates, increasing vegetables, increasing water intake, decreasing eating out, no skipping meals, meal planning and cooking strategies, keeping healthy foods in the home, and planning for success.  Isabella Larson has agreed to follow-up with our clinic in 4 weeks. She was informed of the importance of frequent follow-up visits to maximize her success with intensive lifestyle modifications for her multiple health conditions.   Objective:   Blood pressure 120/78, pulse 69, temperature 97.9 F (36.6 C), height 5\' 8"  (1.727 m), weight 290 lb (131.5 kg), SpO2 99 %. Body mass index is 44.09 kg/m.  General: Cooperative, alert, well developed, in no acute distress. HEENT: Conjunctivae and lids unremarkable. Cardiovascular: Regular rhythm.  Lungs: Normal work of breathing. Neurologic: No focal deficits.   Lab Results  Component Value Date   CREATININE 0.64 07/22/2021   BUN 13 07/22/2021  NA 141 07/22/2021   K 5.0 07/22/2021   CL 105 07/22/2021   CO2 24 07/22/2021   Lab Results  Component Value Date   ALT 19 07/22/2021   AST 22 07/22/2021   ALKPHOS 98 07/22/2021   BILITOT 0.2 07/22/2021   Lab Results  Component Value Date    HGBA1C 6.0 (H) 07/22/2021   HGBA1C 6.3 (H) 10/30/2020   HGBA1C 6.3 (H) 04/26/2020   HGBA1C 6.0 (H) 12/20/2019   HGBA1C 6.2 (H) 08/23/2019   Lab Results  Component Value Date   INSULIN 8.5 07/22/2021   INSULIN 13.1 10/30/2020   INSULIN 14.8 04/26/2020   INSULIN 9.8 12/20/2019   INSULIN 15.4 08/23/2019   Lab Results  Component Value Date   TSH 0.049 (L) 06/03/2021   Lab Results  Component Value Date   CHOL 171 04/26/2020   HDL 56 04/26/2020   LDLCALC 102 (H) 04/26/2020   TRIG 65 04/26/2020   Lab Results  Component Value Date   VD25OH 38.7 06/03/2021   VD25OH 27.1 (L) 01/30/2021   VD25OH 27.2 (L) 12/10/2020   Lab Results  Component Value Date   WBC 6.2 08/23/2019   HGB 12.3 08/23/2019   HCT 38.2 07/22/2021   MCV 84 08/23/2019   PLT 292 08/23/2019   Lab Results  Component Value Date   IRON 81 07/22/2021   TIBC 310 07/22/2021   FERRITIN 44 07/22/2021   Attestation Statements:   Reviewed by clinician on day of visit: allergies, medications, problem list, medical history, surgical history, family history, social history, and previous encounter notes.  I, Jackson Latino, RMA, am acting as Energy manager for [employer], DO.   I have reviewed the above documentation for accuracy and completeness, and I agree with the above. Corinna Capra, DO

## 2021-08-19 NOTE — Telephone Encounter (Signed)
Pt last seen by Dr. Brown.  

## 2021-08-19 NOTE — Telephone Encounter (Signed)
LAST APPOINTMENT DATE: 08/13/21 NEXT APPOINTMENT DATE: 09/30/21   [id number] [place], [place] - [address] AT [facility] [address] [place] [address] Phone: [phone] Fax: [phone]  [facility] - [address] [place] - [address] [place] [address] Phone: [phone] Fax: [phone]  [facility] [phone] [place], [place] - [address] [place] [phone] [place] [address] Phone: [phone] Fax: [phone]  Patient is requesting a refill of the following medications: Pending Prescriptions:                       Disp   Refills   levothyroxine (SYNTHROID) 150 MCG tablet [*30 tab*1       Sig: TAKE 1 TABLET BY MOUTH EVERY DAY   Date last filled: 10/32/22 Previously prescribed by Dr. Manson Passey  Lab Results      Component                Value               Date                      HGBA1C                   6.0 (H)             07/22/2021                HGBA1C                   6.3 (H)             10/30/2020                HGBA1C                   6.3 (H)             04/26/2020           Lab Results      Component                Value               Date                      LDLCALC                  102 (H)             04/26/2020                CREATININE               0.64                07/22/2021           Lab Results      Component                Value               Date                      VD25OH                   38.7  06/03/2021                VD25OH                   27.1 (L)            01/30/2021                VD25OH                   27.2 (L)            12/10/2020            BP Readings from Last 3 Encounters: 08/13/21 : 120/78 07/22/21 : 129/82 06/19/21 : 105/73

## 2021-09-02 ENCOUNTER — Other Ambulatory Visit (INDEPENDENT_AMBULATORY_CARE_PROVIDER_SITE_OTHER): Payer: Self-pay | Admitting: Bariatrics

## 2021-09-02 DIAGNOSIS — E038 Other specified hypothyroidism: Secondary | ICD-10-CM

## 2021-09-18 ENCOUNTER — Other Ambulatory Visit (INDEPENDENT_AMBULATORY_CARE_PROVIDER_SITE_OTHER): Payer: Self-pay | Admitting: Bariatrics

## 2021-09-18 DIAGNOSIS — R600 Localized edema: Secondary | ICD-10-CM

## 2021-09-19 ENCOUNTER — Encounter (INDEPENDENT_AMBULATORY_CARE_PROVIDER_SITE_OTHER): Payer: Self-pay

## 2021-09-19 NOTE — Telephone Encounter (Signed)
Msg sent to pt 

## 2021-09-30 ENCOUNTER — Other Ambulatory Visit: Payer: Self-pay

## 2021-09-30 ENCOUNTER — Encounter (INDEPENDENT_AMBULATORY_CARE_PROVIDER_SITE_OTHER): Payer: Self-pay | Admitting: Bariatrics

## 2021-09-30 ENCOUNTER — Ambulatory Visit (INDEPENDENT_AMBULATORY_CARE_PROVIDER_SITE_OTHER): Payer: BC Managed Care – PPO | Admitting: Bariatrics

## 2021-09-30 VITALS — BP 120/76 | HR 74 | Temp 98.1°F | Ht 68.0 in | Wt 289.0 lb

## 2021-09-30 DIAGNOSIS — E559 Vitamin D deficiency, unspecified: Secondary | ICD-10-CM | POA: Diagnosis not present

## 2021-09-30 DIAGNOSIS — Z862 Personal history of diseases of the blood and blood-forming organs and certain disorders involving the immune mechanism: Secondary | ICD-10-CM

## 2021-09-30 DIAGNOSIS — Z6841 Body Mass Index (BMI) 40.0 and over, adult: Secondary | ICD-10-CM

## 2021-09-30 DIAGNOSIS — E038 Other specified hypothyroidism: Secondary | ICD-10-CM

## 2021-10-01 ENCOUNTER — Encounter (INDEPENDENT_AMBULATORY_CARE_PROVIDER_SITE_OTHER): Payer: Self-pay | Admitting: Bariatrics

## 2021-10-01 LAB — ANEMIA PANEL
Ferritin: 31 ng/mL (ref 15–150)
Folate, Hemolysate: 346 ng/mL
Folate, RBC: 889 ng/mL (ref >498)
Hematocrit: 38.9 % (ref 34.0–46.6)
Iron Saturation: 15 % (ref 15–55)
Iron: 57 ug/dL (ref 27–159)
Retic Ct Pct: 1.1 % (ref 0.6–2.6)
Total Iron Binding Capacity: 369 ug/dL (ref 250–450)
UIBC: 312 ug/dL (ref 131–425)
Vitamin B-12: 1474 pg/mL — ABNORMAL HIGH (ref 232–1245)

## 2021-10-01 LAB — TSH+T4F+T3FREE
Free T4: 0.99 ng/dL (ref 0.82–1.77)
T3, Free: 1.8 pg/mL — ABNORMAL LOW (ref 2.0–4.4)
TSH: 70.6 u[IU]/mL — ABNORMAL HIGH (ref 0.450–4.500)

## 2021-10-01 LAB — T3: T3, Total: 70 ng/dL — ABNORMAL LOW (ref 71–180)

## 2021-10-01 LAB — VITAMIN D 25 HYDROXY (VIT D DEFICIENCY, FRACTURES): Vit D, 25-Hydroxy: 30.7 ng/mL (ref 30.0–100.0)

## 2021-10-01 MED ORDER — LEVOTHYROXINE SODIUM 175 MCG PO TABS: 175.0000 ug | ORAL_TABLET | Freq: Every day | ORAL | 1 refills | Status: DC

## 2021-10-01 NOTE — Progress Notes (Signed)
Chief Complaint:   OBESITY Isabella Larson is here to discuss her progress with her obesity treatment plan along with follow-up of her obesity related diagnoses. Isabella Larson is on the Category 4 Plan and states she is following her eating plan approximately 30% of the time. Isabella Larson states she is doing 0 minutes 0 times per week.  Today's visit was #: 39 Starting weight: 305 lbs Starting date: 04/18/2019 Today's weight: 289 lbs Today's date: 10/01/2021 Total lbs lost to date: 16 lbs Total lbs lost since last in-office visit: 1 lbs  Interim History: [name] is down 1 lb over the holidays. She stated that she did not do that well over the holidays.   Subjective:   1. Vitamin D deficiency [name] is taking Vitamin D currently.  2. Other specified hypothyroidism [name] is currently taking Synthroid 150 mg. Her last TSH was decreased.  3. History of anemia Isabella Larson is taking  iron daily. She notes fatigue. She is sleeping okay.  Assessment/Plan:   1. Vitamin D deficiency Low Vitamin D level contributes to fatigue and are associated with obesity, breast, and colon cancer. We will check  Vitamin D  and Laportia will follow-up for routine testing of Vitamin D, at least 2-3 times per year to avoid over-replacement.  - VITAMIN D 25 Hydroxy (Vit-D Deficiency, Fractures)  2. Other specified hypothyroidism We will check TSH and T3, T4. Orders and follow up as documented in patient record.  Counseling Good thyroid control is important for overall health. Supratherapeutic thyroid levels are dangerous and will not improve weight loss results. Counseling: The correct way to take levothyroxine is fasting, with water, separated by at least 30 minutes from breakfast, and separated by more than 4 hours from calcium, iron, multivitamins, acid reflux medications (PPIs).   - T3 - T4, free - TSH - TSH+T4F+T3Free  3. History of anemia We will check anemia panel. Orders and follow up as documented  in patient record.  Counseling Iron is essential for our bodies to make red blood cells.  Reasons that someone may be deficient include: an iron-deficient diet (more likely in those following vegan or vegetarian diets), women with heavy menses, patients with GI disorders or poor absorption, patients that have had bariatric surgery, frequent blood donors, patients with cancer, and patients with heart disease.   An iron supplement has been recommended. This is found over-the-counter. Iron-rich foods include dark leafy greens, red and white meats, eggs, seafood, and beans.   Certain foods and drinks prevent your body from absorbing iron properly. Avoid eating these foods in the same meal as iron-rich foods or with iron supplements. These foods include: coffee, black tea, and red wine; milk, dairy products, and foods that are high in calcium; beans and soybeans; whole grains.  Constipation can be a side effect of iron supplementation. Increased water and fiber intake are helpful. Water goal: > 2 liters/day. Fiber goal: > 25 grams/day.  - Anemia panel  4. Obesity with current BMI of 44 Isabella Larson is currently in the action stage of change. As such, her goal is to continue with weight loss efforts. She has agreed to the Category 4 Plan.   Shondrea will continue meal planning and she will continue intentional eating. She will get back on track. She will continue Qsymia.   Behavioral modification strategies: increasing lean protein intake, decreasing simple carbohydrates, increasing vegetables, increasing water intake, decreasing eating out, no skipping meals, meal planning and cooking strategies, keeping healthy foods in the home, and planning  for success.  Isabella Larson has agreed to follow-up with our clinic in 3-4 weeks. She was informed of the importance of frequent follow-up visits to maximize her success with intensive lifestyle modifications for her multiple health conditions.   Isabella Larson was informed we  would discuss her lab results at her next visit unless there is a critical issue that needs to be addressed sooner. Isabella Larson agreed to keep her next visit at the agreed upon time to discuss these results.  Objective:   Blood pressure 120/76, pulse 74, temperature 98.1 F (36.7 C), height 5\' 8"  (1.727 m), weight 289 lb (131.1 kg), SpO2 100 %. Body mass index is 43.94 kg/m.  General: Cooperative, alert, well developed, in no acute distress. HEENT: Conjunctivae and lids unremarkable. Cardiovascular: Regular rhythm.  Lungs: Normal work of breathing. Neurologic: No focal deficits.   Lab Results  Component Value Date   CREATININE 0.64 07/22/2021   BUN 13 07/22/2021   NA 141 07/22/2021   K 5.0 07/22/2021   CL 105 07/22/2021   CO2 24 07/22/2021   Lab Results  Component Value Date   ALT 19 07/22/2021   AST 22 07/22/2021   ALKPHOS 98 07/22/2021   BILITOT 0.2 07/22/2021   Lab Results  Component Value Date   HGBA1C 6.0 (H) 07/22/2021   HGBA1C 6.3 (H) 10/30/2020   HGBA1C 6.3 (H) 04/26/2020   HGBA1C 6.0 (H) 12/20/2019   HGBA1C 6.2 (H) 08/23/2019   Lab Results  Component Value Date   INSULIN 8.5 07/22/2021   INSULIN 13.1 10/30/2020   INSULIN 14.8 04/26/2020   INSULIN 9.8 12/20/2019   INSULIN 15.4 08/23/2019   Lab Results  Component Value Date   TSH 70.600 (H) 09/30/2021   Lab Results  Component Value Date   CHOL 171 04/26/2020   HDL 56 04/26/2020   LDLCALC 102 (H) 04/26/2020   TRIG 65 04/26/2020   Lab Results  Component Value Date   VD25OH 30.7 09/30/2021   VD25OH 38.7 06/03/2021   VD25OH 27.1 (L) 01/30/2021   Lab Results  Component Value Date   WBC 6.2 08/23/2019   HGB 12.3 08/23/2019   HCT 38.9 09/30/2021   MCV 84 08/23/2019   PLT 292 08/23/2019   Lab Results  Component Value Date   IRON 57 09/30/2021   TIBC 369 09/30/2021   FERRITIN 31 09/30/2021   Attestation Statements:   Reviewed by clinician on day of visit: allergies, medications, problem list,  medical history, surgical history, family history, social history, and previous encounter notes.  I, 11/28/2021, RMA, am acting as Jackson Latino for Energy manager, DO.  I have reviewed the above documentation for accuracy and completeness, and I agree with the above. [employer], DO

## 2021-10-02 NOTE — Telephone Encounter (Signed)
Please review

## 2021-10-17 ENCOUNTER — Encounter (INDEPENDENT_AMBULATORY_CARE_PROVIDER_SITE_OTHER): Payer: Self-pay

## 2021-10-21 ENCOUNTER — Other Ambulatory Visit: Payer: Self-pay

## 2021-10-21 ENCOUNTER — Ambulatory Visit (INDEPENDENT_AMBULATORY_CARE_PROVIDER_SITE_OTHER): Payer: BC Managed Care – PPO | Admitting: Bariatrics

## 2021-10-21 ENCOUNTER — Encounter (INDEPENDENT_AMBULATORY_CARE_PROVIDER_SITE_OTHER): Payer: Self-pay | Admitting: Bariatrics

## 2021-10-21 VITALS — BP 106/72 | HR 75 | Temp 97.5°F | Ht 68.0 in | Wt 288.0 lb

## 2021-10-21 DIAGNOSIS — E669 Obesity, unspecified: Secondary | ICD-10-CM

## 2021-10-21 DIAGNOSIS — R7303 Prediabetes: Secondary | ICD-10-CM

## 2021-10-21 DIAGNOSIS — R7989 Other specified abnormal findings of blood chemistry: Secondary | ICD-10-CM

## 2021-10-21 DIAGNOSIS — Z6841 Body Mass Index (BMI) 40.0 and over, adult: Secondary | ICD-10-CM

## 2021-10-21 DIAGNOSIS — R600 Localized edema: Secondary | ICD-10-CM

## 2021-10-21 MED ORDER — FUROSEMIDE 20 MG PO TABS: 20.0000 mg | ORAL_TABLET | ORAL | 0 refills | Status: DC

## 2021-10-21 NOTE — Progress Notes (Signed)
Chief Complaint:   OBESITY Isabella Larson is here to discuss her progress with her obesity treatment plan along with follow-up of her obesity related diagnoses. Isabella Larson is on the Category 4 Plan and states she is following her eating plan approximately 70% of the time. Isabella Larson states she is doing 0 minutes 0 times per week.  Today's visit was #: 40 Starting weight: 305 lbs Starting date: 04/18/2019 Today's weight: 288 lbs Today's date: 10/21/2021 Total lbs lost to date: 17 lbs Total lbs lost since last in-office visit: 1 lb  Interim History: Isabella Larson is down 1 additional 1 since her last visit.  Subjective:   1. Edema of both lower extremities Heer's edema of both lower extremities improved with medication.   2. Pre-diabetes Isabella Larson is not on medications currently. Her appetite is normal.  3. Elevated TSH Isabella Larson is taking Synthroid currently. Her energy is okay. Her last THS was 70.600.  Assessment/Plan:   1. Edema of both lower extremities We will refill Lasix 20 mg for 1 month with no refills.   - furosemide (LASIX) 20 MG tablet; Take 1 tablet (20 mg total) by mouth every other day.  Dispense: 30 tablet; Refill: 0  2. Pre-diabetes Isabella Larson will continue to follow the plan and exercise, and decreasing simple carbohydrates to help decrease the risk of diabetes.   3. Elevated TSH Isabella Larson will continue Synthroid. We will repeat TSH in about 6 weeks. Orders and follow up as documented in patient record.  Counseling Good thyroid control is important for overall health. Supratherapeutic thyroid levels are dangerous and will not improve weight loss results. Counseling: The correct way to take levothyroxine is fasting, with water, separated by at least 30 minutes from breakfast, and separated by more than 4 hours from calcium, iron, multivitamins, acid reflux medications (PPIs).    4. Obesity with current BMI of 43.9 Isabella Larson is currently in the action stage of change.  As such, her goal is to continue with weight loss efforts. She has agreed to the Category 4 Plan.   We reviewed labs from 09/30/2021 CMP, iron anemia/profile, Vitamin D, B 12, and thyroid panel.  Exercise goals:  Isabella Larson will be active.   Behavioral modification strategies: increasing lean protein intake, decreasing simple carbohydrates, increasing vegetables, increasing water intake, decreasing eating out, no skipping meals, meal planning and cooking strategies, keeping healthy foods in the home, and planning for success.  Isabella Larson has agreed to follow-up with our clinic in 4 weeks. She was informed of the importance of frequent follow-up visits to maximize her success with intensive lifestyle modifications for her multiple health conditions.   Objective:   Blood pressure 106/72, pulse 75, temperature (!) 97.5 F (36.4 C), height 5\' 8"  (1.727 m), weight 288 lb (130.6 kg), SpO2 99 %. Body mass index is 43.79 kg/m.  General: Cooperative, alert, well developed, in no acute distress. HEENT: Conjunctivae and lids unremarkable. Cardiovascular: Regular rhythm.  Lungs: Normal work of breathing. Neurologic: No focal deficits.   Lab Results  Component Value Date   CREATININE 0.64 07/22/2021   BUN 13 07/22/2021   NA 141 07/22/2021   K 5.0 07/22/2021   CL 105 07/22/2021   CO2 24 07/22/2021   Lab Results  Component Value Date   ALT 19 07/22/2021   AST 22 07/22/2021   ALKPHOS 98 07/22/2021   BILITOT 0.2 07/22/2021   Lab Results  Component Value Date   HGBA1C 6.0 (H) 07/22/2021   HGBA1C 6.3 (H) 10/30/2020   HGBA1C  6.3 (H) 04/26/2020   HGBA1C 6.0 (H) 12/20/2019   HGBA1C 6.2 (H) 08/23/2019   Lab Results  Component Value Date   INSULIN 8.5 07/22/2021   INSULIN 13.1 10/30/2020   INSULIN 14.8 04/26/2020   INSULIN 9.8 12/20/2019   INSULIN 15.4 08/23/2019   Lab Results  Component Value Date   TSH 70.600 (H) 09/30/2021   Lab Results  Component Value Date   CHOL 171 04/26/2020    HDL 56 04/26/2020   LDLCALC 102 (H) 04/26/2020   TRIG 65 04/26/2020   Lab Results  Component Value Date   VD25OH 30.7 09/30/2021   VD25OH 38.7 06/03/2021   VD25OH 27.1 (L) 01/30/2021   Lab Results  Component Value Date   WBC 6.2 08/23/2019   HGB 12.3 08/23/2019   HCT 38.9 09/30/2021   MCV 84 08/23/2019   PLT 292 08/23/2019   Lab Results  Component Value Date   IRON 57 09/30/2021   TIBC 369 09/30/2021   FERRITIN 31 09/30/2021   Attestation Statements:   Reviewed by clinician on day of visit: allergies, medications, problem list, medical history, surgical history, family history, social history, and previous encounter notes.  I, Jackson Latino, RMA, am acting as Energy manager for [employer], DO.  I have reviewed the above documentation for accuracy and completeness, and I agree with the above. Corinna Capra, DO

## 2021-10-22 ENCOUNTER — Encounter (INDEPENDENT_AMBULATORY_CARE_PROVIDER_SITE_OTHER): Payer: Self-pay | Admitting: Bariatrics

## 2021-11-11 ENCOUNTER — Ambulatory Visit (INDEPENDENT_AMBULATORY_CARE_PROVIDER_SITE_OTHER): Payer: BC Managed Care – PPO | Admitting: Bariatrics

## 2021-11-11 ENCOUNTER — Other Ambulatory Visit: Payer: Self-pay

## 2021-11-11 ENCOUNTER — Encounter (INDEPENDENT_AMBULATORY_CARE_PROVIDER_SITE_OTHER): Payer: Self-pay | Admitting: Bariatrics

## 2021-11-11 VITALS — BP 115/80 | HR 61 | Temp 97.8°F | Ht 68.0 in | Wt 285.0 lb

## 2021-11-11 DIAGNOSIS — Z6841 Body Mass Index (BMI) 40.0 and over, adult: Secondary | ICD-10-CM

## 2021-11-11 DIAGNOSIS — E669 Obesity, unspecified: Secondary | ICD-10-CM

## 2021-11-11 DIAGNOSIS — R600 Localized edema: Secondary | ICD-10-CM | POA: Diagnosis not present

## 2021-11-11 DIAGNOSIS — Z9884 Bariatric surgery status: Secondary | ICD-10-CM

## 2021-11-11 DIAGNOSIS — E038 Other specified hypothyroidism: Secondary | ICD-10-CM

## 2021-11-11 MED ORDER — LEVOTHYROXINE SODIUM 175 MCG PO TABS: 175.0000 ug | ORAL_TABLET | Freq: Every day | ORAL | 1 refills | Status: DC

## 2021-11-11 MED ORDER — PHENTERMINE-TOPIRAMATE ER 11.25-69 MG PO CP24: ORAL_CAPSULE | ORAL | 0 refills | Status: DC

## 2021-11-11 MED ORDER — FUROSEMIDE 20 MG PO TABS: 20.0000 mg | ORAL_TABLET | ORAL | 0 refills | Status: DC

## 2021-11-11 NOTE — Progress Notes (Signed)
Chief Complaint:   OBESITY Isabella Larson is here to discuss her progress with her obesity treatment plan along with follow-up of her obesity related diagnoses. Isabella Larson is on the Category 4 Plan and states she is following her eating plan approximately 60% of the time. Isabella Larson states she is doing 0 minutes 0 times per week.  Today's visit was #: 41 Starting weight: 305 lbs Starting date: 04/18/2019 Today's weight: 285 lbs Today's date: 11/11/2021 Total lbs lost to date: 20 Total lbs lost since last in-office visit: 3  Interim History: Isabella Larson is down an additional 3 lbs. Her eating is ok, and she is struggling slightly with her water.  Subjective:   1. Edema of both lower extremities Isabella Larson notes edema when standing at work.  2. H/O gastric bypass Isabella Larson still has some mild restrictions.   3. Other specified hypothyroidism Isabella Larson is taking levothyroxine with issues.   Assessment/Plan:   1. Edema of both lower extremities Isabella Larson will continue her medications, and we will refill Lasix for 1 month.  - furosemide (LASIX) 20 MG tablet; Take 1 tablet (20 mg total) by mouth every other day.  Dispense: 30 tablet; Refill: 0  2. H/O gastric bypass Isabella Larson will eat smaller frequent meals.  3. Other specified hypothyroidism We will refill levoyroxine for 2 month, and Isabella Larson is to follow up with her Endocrinologist. Orders and follow up as documented in patient record.  Counseling Good thyroid control is important for overall health. Supratherapeutic thyroid levels are dangerous and will not improve weight loss results. Counseling: The correct way to take levothyroxine is fasting, with water, separated by at least 30 minutes from breakfast, and separated by more than 4 hours from calcium, iron, multivitamins, acid reflux medications (PPIs).   - levothyroxine (SYNTHROID) 175 MCG tablet; Take 1 tablet (175 mcg total) by mouth daily.  Dispense: 30 tablet; Refill: 1  4.  Obesity with current BMI of 43.4 Isabella Larson is currently in the action stage of change. As such, her goal is to continue with weight loss efforts. She has agreed to the Category 4 Plan.   Meal planning, will adhere closely to the plan.  We discussed various medication options to help Isabella Larson with her weight loss efforts and we both agreed to continue phentermine-topiramate and we will refill for 1 month. .  - Phentermine-Topiramate 11.25-69 MG CP24; 1 capsule daily in the am.  Dispense: 30 capsule; Refill: 0  Exercise goals: No exercise has been prescribed at this time, but will do more outside activities.  Behavioral modification strategies: increasing lean protein intake, decreasing simple carbohydrates, increasing vegetables, increasing water intake, decreasing eating out, no skipping meals, meal planning and cooking strategies, keeping healthy foods in the home, and planning for success.  Isabella Larson has agreed to follow-up with our clinic in 4 weeks. She was informed of the importance of frequent follow-up visits to maximize her success with intensive lifestyle modifications for her multiple health conditions.   Objective:   Blood pressure 115/80, pulse 61, temperature 97.8 F (36.6 C), height 5\' 8"  (1.727 m), weight 285 lb (129.3 kg), last menstrual period 11/11/2021, SpO2 100 %. Body mass index is 43.33 kg/m.  General: Cooperative, alert, well developed, in no acute distress. HEENT: Conjunctivae and lids unremarkable. Cardiovascular: Regular rhythm.  Lungs: Normal work of breathing. Neurologic: No focal deficits.   Lab Results  Component Value Date   CREATININE 0.64 07/22/2021   BUN 13 07/22/2021   NA 141 07/22/2021   K 5.0 07/22/2021  CL 105 07/22/2021   CO2 24 07/22/2021   Lab Results  Component Value Date   ALT 19 07/22/2021   AST 22 07/22/2021   ALKPHOS 98 07/22/2021   BILITOT 0.2 07/22/2021   Lab Results  Component Value Date   HGBA1C 6.0 (H) 07/22/2021    HGBA1C 6.3 (H) 10/30/2020   HGBA1C 6.3 (H) 04/26/2020   HGBA1C 6.0 (H) 12/20/2019   HGBA1C 6.2 (H) 08/23/2019   Lab Results  Component Value Date   INSULIN 8.5 07/22/2021   INSULIN 13.1 10/30/2020   INSULIN 14.8 04/26/2020   INSULIN 9.8 12/20/2019   INSULIN 15.4 08/23/2019   Lab Results  Component Value Date   TSH 70.600 (H) 09/30/2021   Lab Results  Component Value Date   CHOL 171 04/26/2020   HDL 56 04/26/2020   LDLCALC 102 (H) 04/26/2020   TRIG 65 04/26/2020   Lab Results  Component Value Date   VD25OH 30.7 09/30/2021   VD25OH 38.7 06/03/2021   VD25OH 27.1 (L) 01/30/2021   Lab Results  Component Value Date   WBC 6.2 08/23/2019   HGB 12.3 08/23/2019   HCT 38.9 09/30/2021   MCV 84 08/23/2019   PLT 292 08/23/2019   Lab Results  Component Value Date   IRON 57 09/30/2021   TIBC 369 09/30/2021   FERRITIN 31 09/30/2021   Attestation Statements:   Reviewed by clinician on day of visit: allergies, medications, problem list, medical history, surgical history, family history, social history, and previous encounter notes.   Trude Mcburney, am acting as Energy manager for [employer], DO.  I have reviewed the above documentation for accuracy and completeness, and I agree with the above. Corinna Capra, DO

## 2021-11-13 ENCOUNTER — Other Ambulatory Visit (INDEPENDENT_AMBULATORY_CARE_PROVIDER_SITE_OTHER): Payer: Self-pay | Admitting: Bariatrics

## 2021-11-13 DIAGNOSIS — E038 Other specified hypothyroidism: Secondary | ICD-10-CM

## 2021-11-13 NOTE — Telephone Encounter (Signed)
Dr.Brown 

## 2021-12-09 ENCOUNTER — Encounter (INDEPENDENT_AMBULATORY_CARE_PROVIDER_SITE_OTHER): Payer: Self-pay | Admitting: Bariatrics

## 2021-12-09 ENCOUNTER — Ambulatory Visit (INDEPENDENT_AMBULATORY_CARE_PROVIDER_SITE_OTHER): Payer: BC Managed Care – PPO | Admitting: Bariatrics

## 2021-12-09 ENCOUNTER — Other Ambulatory Visit: Payer: Self-pay

## 2021-12-09 VITALS — BP 122/79 | HR 77 | Temp 97.6°F | Ht 68.0 in | Wt 290.0 lb

## 2021-12-09 DIAGNOSIS — R600 Localized edema: Secondary | ICD-10-CM | POA: Diagnosis not present

## 2021-12-09 DIAGNOSIS — E038 Other specified hypothyroidism: Secondary | ICD-10-CM | POA: Diagnosis not present

## 2021-12-09 DIAGNOSIS — Z6841 Body Mass Index (BMI) 40.0 and over, adult: Secondary | ICD-10-CM

## 2021-12-09 DIAGNOSIS — E669 Obesity, unspecified: Secondary | ICD-10-CM

## 2021-12-09 MED ORDER — FUROSEMIDE 20 MG PO TABS: 20.0000 mg | ORAL_TABLET | ORAL | 0 refills | Status: DC

## 2021-12-09 MED ORDER — LEVOTHYROXINE SODIUM 175 MCG PO TABS: 175.0000 ug | ORAL_TABLET | Freq: Every day | ORAL | 1 refills | Status: DC

## 2021-12-09 MED ORDER — PHENTERMINE-TOPIRAMATE ER 11.25-69 MG PO CP24: ORAL_CAPSULE | ORAL | 0 refills | Status: DC

## 2021-12-10 NOTE — Progress Notes (Signed)
? ? ? ?Chief Complaint:  ? ?OBESITY ?Isabella Larson is here to discuss her progress with her obesity treatment plan along with follow-up of her obesity related diagnoses. Isabella Larson is on the Category 4 Plan and states she is following her eating plan approximately 40% of the time. Isabella Larson states she is doing 0 minutes 0 times per week. ? ?Today's visit was #: 42 ?Starting weight: 305 lbs ?Starting date: 04/18/2019 ?Today's weight: 290 lbs ?Today's date: 12/09/2021 ?Total lbs lost to date: 15 lbs ?Total lbs lost since last in-office visit: 0 ? ?Interim History: Isabella Larson is up 5 lbs since her last visit. She is taking Qsymia. She has done some stress eating.  ? ?Subjective:  ? ?1. Edema of both lower extremities ?Isabella Larson states Lasix helps with edema and water weight. Need a dry weight.  ? ?2. Other specified hypothyroidism ?Isabella Larson is taking medications as directed.  ? ?Assessment/Plan:  ? ?1. Edema of both lower extremities ?We will refill Lasix 20 mg for 1 month with no refills.  ? ?- furosemide (LASIX) 20 MG tablet; Take 1 tablet (20 mg total) by mouth every other day.  Dispense: 30 tablet; Refill: 0 ? ?2. Other specified hypothyroidism ?We will refill Synthroid 175 mg for 1 month with no refills. Orders and follow up as documented in patient record. ? ?Counseling ?Good thyroid control is important for overall health. Supratherapeutic thyroid levels are dangerous and will not improve weight loss results. ?Counseling: The correct way to take levothyroxine is fasting, with water, separated by at least 30 minutes from breakfast, and separated by more than 4 hours from calcium, iron, multivitamins, acid reflux medications (PPIs).   ? ?- levothyroxine (SYNTHROID) 175 MCG tablet; Take 1 tablet (175 mcg total) by mouth daily.  Dispense: 30 tablet; Refill: 1 ? ?3. Obesity with current BMI of 44.2 ?Isabella Larson is currently in the action stage of change. As such, her goal is to continue with weight loss efforts. She has agreed to  the Category 4 Plan.  ? ?Isabella Larson will adhere closely to the plan 80-90%. We will refill Qsymia 11.25 for 1 month with no refills.  ? ?- Phentermine-Topiramate 11.25-69 MG CP24; 1 capsule daily in the am.  Dispense: 30 capsule; Refill: 0 ? ?Exercise goals: No exercise has been prescribed at this time. ? ?Behavioral modification strategies: increasing lean protein intake, decreasing simple carbohydrates, increasing vegetables, increasing water intake, decreasing eating out, no skipping meals, meal planning and cooking strategies, keeping healthy foods in the home, and planning for success. ? ?Isabella Larson has agreed to follow-up with our clinic in 4 weeks. She was informed of the importance of frequent follow-up visits to maximize her success with intensive lifestyle modifications for her multiple health conditions.  ? ?Objective:  ? ?Blood pressure 122/79, pulse 77, temperature 97.6 ?F (36.4 ?C), height 5\' 8"  (1.727 m), weight 290 lb (131.5 kg), last menstrual period 11/11/2021, SpO2 100 %. ?Body mass index is 44.09 kg/m?. ? ?General: Cooperative, alert, well developed, in no acute distress. ?HEENT: Conjunctivae and lids unremarkable. ?Cardiovascular: Regular rhythm.  ?Lungs: Normal work of breathing. ?Neurologic: No focal deficits.  ? ?Lab Results  ?Component Value Date  ? CREATININE 0.64 07/22/2021  ? BUN 13 07/22/2021  ? NA 141 07/22/2021  ? K 5.0 07/22/2021  ? CL 105 07/22/2021  ? CO2 24 07/22/2021  ? ?Lab Results  ?Component Value Date  ? ALT 19 07/22/2021  ? AST 22 07/22/2021  ? ALKPHOS 98 07/22/2021  ? BILITOT 0.2 07/22/2021  ? ?  Lab Results  ?Component Value Date  ? HGBA1C 6.0 (H) 07/22/2021  ? HGBA1C 6.3 (H) 10/30/2020  ? HGBA1C 6.3 (H) 04/26/2020  ? HGBA1C 6.0 (H) 12/20/2019  ? HGBA1C 6.2 (H) 08/23/2019  ? ?Lab Results  ?Component Value Date  ? INSULIN 8.5 07/22/2021  ? INSULIN 13.1 10/30/2020  ? INSULIN 14.8 04/26/2020  ? INSULIN 9.8 12/20/2019  ? INSULIN 15.4 08/23/2019  ? ?Lab Results  ?Component Value Date   ? TSH 70.600 (H) 09/30/2021  ? ?Lab Results  ?Component Value Date  ? CHOL 171 04/26/2020  ? HDL 56 04/26/2020  ? LDLCALC 102 (H) 04/26/2020  ? TRIG 65 04/26/2020  ? ?Lab Results  ?Component Value Date  ? VD25OH 30.7 09/30/2021  ? VD25OH 38.7 06/03/2021  ? VD25OH 27.1 (L) 01/30/2021  ? ?Lab Results  ?Component Value Date  ? WBC 6.2 08/23/2019  ? HGB 12.3 08/23/2019  ? HCT 38.9 09/30/2021  ? MCV 84 08/23/2019  ? PLT 292 08/23/2019  ? ?Lab Results  ?Component Value Date  ? IRON 57 09/30/2021  ? TIBC 369 09/30/2021  ? FERRITIN 31 09/30/2021  ? ?Attestation Statements:  ? ?Reviewed by clinician on day of visit: allergies, medications, problem list, medical history, surgical history, family history, social history, and previous encounter notes. ? ?I, Jackson Latino, RMA, am acting as transcriptionist for [employer], DO. ? ?I have reviewed the above documentation for accuracy and completeness, and I agree with the above. Corinna Capra, DO ? ?

## 2021-12-11 ENCOUNTER — Encounter (INDEPENDENT_AMBULATORY_CARE_PROVIDER_SITE_OTHER): Payer: Self-pay | Admitting: Bariatrics

## 2021-12-13 ENCOUNTER — Other Ambulatory Visit (INDEPENDENT_AMBULATORY_CARE_PROVIDER_SITE_OTHER): Payer: Self-pay | Admitting: Bariatrics

## 2021-12-13 DIAGNOSIS — R600 Localized edema: Secondary | ICD-10-CM

## 2022-01-06 ENCOUNTER — Ambulatory Visit (INDEPENDENT_AMBULATORY_CARE_PROVIDER_SITE_OTHER): Payer: BC Managed Care – PPO | Admitting: Bariatrics

## 2022-01-06 ENCOUNTER — Encounter (INDEPENDENT_AMBULATORY_CARE_PROVIDER_SITE_OTHER): Payer: Self-pay | Admitting: Bariatrics

## 2022-01-06 VITALS — BP 115/81 | HR 64 | Temp 97.8°F | Ht 68.0 in | Wt 292.0 lb

## 2022-01-06 DIAGNOSIS — R600 Localized edema: Secondary | ICD-10-CM

## 2022-01-06 DIAGNOSIS — E669 Obesity, unspecified: Secondary | ICD-10-CM | POA: Diagnosis not present

## 2022-01-06 DIAGNOSIS — Z6841 Body Mass Index (BMI) 40.0 and over, adult: Secondary | ICD-10-CM

## 2022-01-06 DIAGNOSIS — E038 Other specified hypothyroidism: Secondary | ICD-10-CM | POA: Diagnosis not present

## 2022-01-06 MED ORDER — PHENTERMINE-TOPIRAMATE ER 11.25-69 MG PO CP24: ORAL_CAPSULE | ORAL | 0 refills | Status: DC

## 2022-01-06 MED ORDER — FUROSEMIDE 20 MG PO TABS: 20.0000 mg | ORAL_TABLET | ORAL | 0 refills | Status: DC

## 2022-01-15 NOTE — Progress Notes (Signed)
? ? ? ?Chief Complaint:  ? ?OBESITY ?Isabella Larson is here to discuss her progress with her obesity treatment plan along with follow-up of her obesity related diagnoses. Isabella Larson is on the Category 4 Plan and states she is following her eating plan approximately 30% of the time. Isabella Larson states she is walking for 45 minutes 3-4 times per week. ? ?Today's visit was #: 43 ?Starting weight: 305 lbs ?Starting date: 04/18/2019 ?Today's weight: 292 lbs ?Today's date: 01/06/2022 ?Total lbs lost to date: 13 lbs ?Total lbs lost since last in-office visit: 0 ? ?Interim History: Isabella Larson is up 2 lbs since her last visit. She has been since with Covid 19. ? ?Subjective:  ? ?1. Edema of both lower extremities ?Isabella Larson is currently taking Lasix.  ? ?2. Other specified hypothyroidism ?Isabella Larson is taking Synthroid currently.  ? ?Assessment/Plan:  ? ?1. Edema of both lower extremities ?We will refill Lasix 20 mg for 1 month with no refills.  ? ?- furosemide (LASIX) 20 MG tablet; Take 1 tablet (20 mg total) by mouth every other day.  Dispense: 30 tablet; Refill: 0 ? ?2. Other specified hypothyroidism ?Isabella Larson will continue taking Synthroid. Orders and follow up as documented in patient record. ? ?Counseling ?Good thyroid control is important for overall health. Supratherapeutic thyroid levels are dangerous and will not improve weight loss results. ?Counseling: The correct way to take levothyroxine is fasting, with water, separated by at least 30 minutes from breakfast, and separated by more than 4 hours from calcium, iron, multivitamins, acid reflux medications (PPIs).   ? ?3. Obesity with current BMI of 44.5 ?Isabella Larson is currently in the action stage of change. As such, her goal is to continue with weight loss efforts. She has agreed to the Category 4 Plan.  ? ?Isabella Larson will continue meal planning. She will adhere more closely to the plan 80-90%. We will refill Qsymia 11.25-69  mg for 1 month with no refills.  ? ?-  Phentermine-Topiramate 11.25-69 MG CP24; 1 capsule daily in the am.  Dispense: 30 capsule; Refill: 0 ? ?Exercise goals:  As is.  ? ?Behavioral modification strategies: increasing lean protein intake, decreasing simple carbohydrates, increasing vegetables, increasing water intake, decreasing eating out, no skipping meals, meal planning and cooking strategies, keeping healthy foods in the home, and planning for success. ? ?Isabella Larson has agreed to follow-up with our clinic in 4-5 weeks (fasting). She was informed of the importance of frequent follow-up visits to maximize her success with intensive lifestyle modifications for her multiple health conditions.  ? ?Objective:  ? ?Blood pressure 115/81, pulse 64, temperature 97.8 ?F (36.6 ?C), height 5\' 8"  (1.727 m), weight 292 lb (132.5 kg), SpO2 100 %. ?Body mass index is 44.4 kg/m?. ? ?General: Cooperative, alert, well developed, in no acute distress. ?HEENT: Conjunctivae and lids unremarkable. ?Cardiovascular: Regular rhythm.  ?Lungs: Normal work of breathing. ?Neurologic: No focal deficits.  ? ?Lab Results  ?Component Value Date  ? CREATININE 0.64 07/22/2021  ? BUN 13 07/22/2021  ? NA 141 07/22/2021  ? K 5.0 07/22/2021  ? CL 105 07/22/2021  ? CO2 24 07/22/2021  ? ?Lab Results  ?Component Value Date  ? ALT 19 07/22/2021  ? AST 22 07/22/2021  ? ALKPHOS 98 07/22/2021  ? BILITOT 0.2 07/22/2021  ? ?Lab Results  ?Component Value Date  ? HGBA1C 6.0 (H) 07/22/2021  ? HGBA1C 6.3 (H) 10/30/2020  ? HGBA1C 6.3 (H) 04/26/2020  ? HGBA1C 6.0 (H) 12/20/2019  ? HGBA1C 6.2 (H) 08/23/2019  ? ?Lab Results  ?  Component Value Date  ? INSULIN 8.5 07/22/2021  ? INSULIN 13.1 10/30/2020  ? INSULIN 14.8 04/26/2020  ? INSULIN 9.8 12/20/2019  ? INSULIN 15.4 08/23/2019  ? ?Lab Results  ?Component Value Date  ? TSH 70.600 (H) 09/30/2021  ? ?Lab Results  ?Component Value Date  ? CHOL 171 04/26/2020  ? HDL 56 04/26/2020  ? LDLCALC 102 (H) 04/26/2020  ? TRIG 65 04/26/2020  ? ?Lab Results  ?Component Value  Date  ? VD25OH 30.7 09/30/2021  ? VD25OH 38.7 06/03/2021  ? VD25OH 27.1 (L) 01/30/2021  ? ?Lab Results  ?Component Value Date  ? WBC 6.2 08/23/2019  ? HGB 12.3 08/23/2019  ? HCT 38.9 09/30/2021  ? MCV 84 08/23/2019  ? PLT 292 08/23/2019  ? ?Lab Results  ?Component Value Date  ? IRON 57 09/30/2021  ? TIBC 369 09/30/2021  ? FERRITIN 31 09/30/2021  ? ?Attestation Statements:  ? ?Reviewed by clinician on day of visit: allergies, medications, problem list, medical history, surgical history, family history, social history, and previous encounter notes. ? ?I, Jackson Latino, RMA, am acting as transcriptionist for [employer], DO. ? ?I have reviewed the above documentation for accuracy and completeness, and I agree with the above. Corinna Capra, DO ? ?

## 2022-01-16 ENCOUNTER — Encounter (INDEPENDENT_AMBULATORY_CARE_PROVIDER_SITE_OTHER): Payer: Self-pay | Admitting: Bariatrics

## 2022-02-10 ENCOUNTER — Encounter (INDEPENDENT_AMBULATORY_CARE_PROVIDER_SITE_OTHER): Payer: Self-pay | Admitting: Family Medicine

## 2022-02-10 ENCOUNTER — Other Ambulatory Visit (INDEPENDENT_AMBULATORY_CARE_PROVIDER_SITE_OTHER): Payer: Self-pay | Admitting: Bariatrics

## 2022-02-10 ENCOUNTER — Ambulatory Visit (INDEPENDENT_AMBULATORY_CARE_PROVIDER_SITE_OTHER): Payer: BC Managed Care – PPO | Admitting: Family Medicine

## 2022-02-10 VITALS — BP 120/84 | HR 67 | Temp 98.0°F | Ht 68.0 in | Wt 292.0 lb

## 2022-02-10 DIAGNOSIS — R7303 Prediabetes: Secondary | ICD-10-CM | POA: Diagnosis not present

## 2022-02-10 DIAGNOSIS — Z6841 Body Mass Index (BMI) 40.0 and over, adult: Secondary | ICD-10-CM

## 2022-02-10 DIAGNOSIS — E559 Vitamin D deficiency, unspecified: Secondary | ICD-10-CM | POA: Diagnosis not present

## 2022-02-10 DIAGNOSIS — E038 Other specified hypothyroidism: Secondary | ICD-10-CM | POA: Insufficient documentation

## 2022-02-10 DIAGNOSIS — Z9189 Other specified personal risk factors, not elsewhere classified: Secondary | ICD-10-CM

## 2022-02-10 DIAGNOSIS — R600 Localized edema: Secondary | ICD-10-CM

## 2022-02-10 DIAGNOSIS — Z9884 Bariatric surgery status: Secondary | ICD-10-CM

## 2022-02-10 DIAGNOSIS — E669 Obesity, unspecified: Secondary | ICD-10-CM

## 2022-02-10 DIAGNOSIS — E538 Deficiency of other specified B group vitamins: Secondary | ICD-10-CM

## 2022-02-10 DIAGNOSIS — D508 Other iron deficiency anemias: Secondary | ICD-10-CM

## 2022-02-11 LAB — IRON AND TIBC
Iron Saturation: 10 % — ABNORMAL LOW (ref 15–55)
Iron: 39 ug/dL (ref 27–159)
Total Iron Binding Capacity: 399 ug/dL (ref 250–450)
UIBC: 360 ug/dL (ref 131–425)

## 2022-02-11 LAB — TSH: TSH: 4.09 u[IU]/mL (ref 0.450–4.500)

## 2022-02-11 LAB — TRANSFERRIN: Transferrin: 336 mg/dL (ref 192–364)

## 2022-02-11 LAB — T4, FREE: Free T4: 1.26 ng/dL (ref 0.82–1.77)

## 2022-02-11 LAB — HEMOGLOBIN A1C
Est. average glucose Bld gHb Est-mCnc: 137 mg/dL
Hgb A1c MFr Bld: 6.4 % — ABNORMAL HIGH (ref 4.8–5.6)

## 2022-02-11 LAB — T3: T3, Total: 96 ng/dL (ref 71–180)

## 2022-02-11 LAB — VITAMIN B12: Vitamin B-12: 577 pg/mL (ref 232–1245)

## 2022-02-11 LAB — FERRITIN: Ferritin: 9 ng/mL — ABNORMAL LOW (ref 15–150)

## 2022-02-11 LAB — VITAMIN D 25 HYDROXY (VIT D DEFICIENCY, FRACTURES): Vit D, 25-Hydroxy: 28.9 ng/mL — ABNORMAL LOW (ref 30.0–100.0)

## 2022-02-11 LAB — FOLATE: Folate: 9.4 ng/mL (ref >3.0)

## 2022-02-11 LAB — INSULIN, RANDOM: INSULIN: 14.2 u[IU]/mL (ref 2.6–24.9)

## 2022-02-16 NOTE — Progress Notes (Unsigned)
Chief Complaint:   OBESITY Isabella Larson is here to discuss her progress with her obesity treatment plan along with follow-up of her obesity related diagnoses. Isabella Larson is on the Category 4 Plan and states she is following her eating plan approximately 40% of the time. Isabella Larson states she is walking 40 minutes 2 to 3 times per week.  Today's visit was #: 44 Starting weight: 305 lbs Starting date: 04/18/2019 Today's weight: 292 lbs Today's date: 02/10/2022 Total lbs lost to date: 13 Total lbs lost since last in-office visit: 0  Interim History: This is Isabella Larson's 1st visit with me. Her last office visit was just over 1 month ago. 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 Isabella Larson has seen and increase in swelling of her Left extremity. She is on Lasix per Dr. Manson Passey. Isabella Larson is also here for fasting labs. I do not see in prior office visit notes what to order.  Subjective:   1. Prediabetes Isabella Larson has a diagnosis of prediabetes based on her elevated HgA1c and was informed this puts her at greater risk of developing diabetes. She continues to work on diet and exercise to decrease her risk of diabetes. She denies nausea or hypoglycemia.  Lab Results  Component Value Date   HGBA1C 6.4 (H) 02/10/2022   Lab Results  Component Value Date   INSULIN 14.2 02/10/2022   INSULIN 8.5 07/22/2021   INSULIN 13.1 10/30/2020   INSULIN 14.8 04/26/2020   INSULIN 9.8 12/20/2019   2. Vitamin D deficiency Isabella Larson is on some OTC vitamin D supplements, but she is unsure of the dose.  3. Lower extremity edema When Isabella Larson eats off her plan more, such as when she has more salt or more sugar, she has swelling in her feet and ankles. This has increased lately due to her poor diet. Dr. Manson Passey placed her on Lasix in 02/27/21 and she has been on it for several months now.  4. Other specified hypothyroidism Isabella Larson saw her endocrinologist 3 or 4  months ago. Dr. Talmage Nap changed her medicines at that time. She hasn't been rechecked since.  5. Other iron deficiency anemia Isabella Larson gets infusions once yearly or so. Her last infusion was the end of October or early November 2022.  6. Larson deficiency Isabella Larson takes an OTC Larson-1,043mcg supplement every day. She is status post gastric bypass in 2005.  7. At risk for malnutrition Isabella Larson is at risk of malnutrition due to her history of gastric bypass and off plan eating.  Assessment/Plan:   1. Prediabetes Miyu will continue to work on weight loss, exercise, and decreasing simple carbohydrates to help decrease the risk of diabetes.   - Hemoglobin A1c - Insulin, random  2. Vitamin D deficiency We will recheck her vitamin D, which was suboptimally controlled on her past labs.  - VITAMIN D 25 Hydroxy (Vit-D Deficiency, Fractures)  3. Lower extremity edema We will recheck at her next office visit and she agrees to follow up as directed.  4. Other specified hypothyroidism Isabella Larson requests thyroid labs to be done and sent to her endocrinologist, Dr. Talmage Nap at [facility]. I recommend that she follow up with them in the very near future.  - T4, free - T3 - TSH  5. Other iron deficiency anemia Labs will be obtained today. Infusions and future labs per PCP. Her anemia panel results will need to be sent to her PCP for management.  - Folate -  Iron and TIBC - Ferritin - Transferrin  6. Larson deficiency Isabella Larson will be checked today. She agrees to continue taking her supplement until labs are resulted.  - Vitamin Larson - Folate  7. At risk for malnutrition Isabella Larson was given approximately 24 minutes of counseling today regarding prevention of malnutrition and ways to meet macronutrient goals.    8. Obesity with current BMI of 44.5 Isabella Larson is currently in the action stage of change. As such, her goal is to continue with weight loss efforts. She has agreed to the Category 4 Plan.    Exercise goals: For substantial health benefits, adults should do at least 150 minutes (2 hours and 30 minutes) a week of moderate-intensity, or 75 minutes (1 hour and 15 minutes) a week of vigorous-intensity aerobic physical activity, or an equivalent combination of moderate- and vigorous-intensity aerobic activity. Aerobic activity should be performed in episodes of at least 10 minutes, and preferably, it should be spread throughout the week.  Behavioral modification strategies: increasing lean protein intake, decreasing simple carbohydrates, increasing water intake, and decreasing sodium intake.  Isabella Larson has agreed to follow-up with our clinic in 4 weeks. She was informed of the importance of frequent follow-up visits to maximize her success with intensive lifestyle modifications for her multiple health conditions.   Isabella Larson was informed we would discuss her lab results at her next visit unless there is a critical issue that needs to be addressed sooner. Isabella Larson agreed to keep her next visit at the agreed upon time to discuss these results.  Objective:   Blood pressure 120/84, pulse 67, temperature 98 F (36.7 C), height 5\' 8"  (1.727 m), weight 292 lb (132.5 kg), SpO2 99 %. Body mass index is 44.4 kg/m.  General: Cooperative, alert, well developed, in no acute distress. HEENT: Conjunctivae and lids unremarkable. Cardiovascular: Regular rhythm.  Lungs: Normal work of breathing. Neurologic: No focal deficits.   Lab Results  Component Value Date   CREATININE 0.64 07/22/2021   BUN 13 07/22/2021   NA 141 07/22/2021   K 5.0 07/22/2021   CL 105 07/22/2021   CO2 24 07/22/2021   Lab Results  Component Value Date   ALT 19 07/22/2021   AST 22 07/22/2021   ALKPHOS 98 07/22/2021   BILITOT 0.2 07/22/2021   Lab Results  Component Value Date   HGBA1C 6.4 (H) 02/10/2022   HGBA1C 6.0 (H) 07/22/2021   HGBA1C 6.3 (H) 10/30/2020   HGBA1C 6.3 (H) 04/26/2020   HGBA1C 6.0 (H) 12/20/2019    Lab Results  Component Value Date   INSULIN 14.2 02/10/2022   INSULIN 8.5 07/22/2021   INSULIN 13.1 10/30/2020   INSULIN 14.8 04/26/2020   INSULIN 9.8 12/20/2019   Lab Results  Component Value Date   TSH 4.090 02/10/2022   Lab Results  Component Value Date   CHOL 171 04/26/2020   HDL 56 04/26/2020   LDLCALC 102 (H) 04/26/2020   TRIG 65 04/26/2020   Lab Results  Component Value Date   VD25OH 28.9 (L) 02/10/2022   VD25OH 30.7 09/30/2021   VD25OH 38.7 06/03/2021   Lab Results  Component Value Date   WBC 6.2 08/23/2019   HGB 12.3 08/23/2019   HCT 38.9 09/30/2021   MCV 84 08/23/2019   PLT 292 08/23/2019   Lab Results  Component Value Date   IRON 39 02/10/2022   TIBC 399 02/10/2022   FERRITIN 9 (L) 02/10/2022   Attestation Statements:   Reviewed by clinician on day of visit: allergies,  medications, problem list, medical history, surgical history, family history, social history, and previous encounter notes.  IKirke Corin, CMA, am acting as transcriptionist for [employer], DO  I have reviewed the above documentation for accuracy and completeness, and I agree with the above. -  ***

## 2022-02-19 ENCOUNTER — Telehealth (INDEPENDENT_AMBULATORY_CARE_PROVIDER_SITE_OTHER): Payer: Self-pay

## 2022-02-19 DIAGNOSIS — D649 Anemia, unspecified: Secondary | ICD-10-CM | POA: Insufficient documentation

## 2022-02-19 NOTE — Telephone Encounter (Signed)
-----   Message from Roswell Nickel, DO sent at 02/19/2022  3:10 PM EDT ----- Regarding: FW: pt lab results... Call patient and tell her to check with her hematologist . Her ferritin and iron levels are low again. Not as low as before but she may not want to wait as long to have her iron infusion.  ----- Message ----- From: Thomasene Lot, DO Sent: 02/18/2022   6:07 PM EDT To: Roswell Nickel, DO Subject: pt lab results...                              Isabella Larson,   As you can see, I saw your pt last time and we obtained labs.  She is due to f/up with you in the near future to review them.  I am sorry but I do not see that the CBC was drawn, only the anemia panel and transferrin level.  Not sure why- so sorry bout that.   P.s.- She requested her thyroid labs be sent to Dr Talmage Nap her Endo doc as well.   Let me know if you have any questions.    ----- Message ----- From: Interface, [facility] Lab Results In Sent: 02/11/2022   7:40 AM EDT To: Thomasene Lot, DO

## 2022-02-19 NOTE — Telephone Encounter (Signed)
Left message for patient to return call.

## 2022-02-20 ENCOUNTER — Encounter (INDEPENDENT_AMBULATORY_CARE_PROVIDER_SITE_OTHER): Payer: Self-pay

## 2022-03-10 ENCOUNTER — Encounter (INDEPENDENT_AMBULATORY_CARE_PROVIDER_SITE_OTHER): Payer: Self-pay | Admitting: Bariatrics

## 2022-03-10 ENCOUNTER — Ambulatory Visit (INDEPENDENT_AMBULATORY_CARE_PROVIDER_SITE_OTHER): Payer: BC Managed Care – PPO | Admitting: Bariatrics

## 2022-03-10 VITALS — BP 118/80 | HR 63 | Temp 98.0°F | Ht 68.0 in | Wt 295.0 lb

## 2022-03-10 DIAGNOSIS — D508 Other iron deficiency anemias: Secondary | ICD-10-CM | POA: Diagnosis not present

## 2022-03-10 DIAGNOSIS — Z6841 Body Mass Index (BMI) 40.0 and over, adult: Secondary | ICD-10-CM

## 2022-03-10 DIAGNOSIS — E038 Other specified hypothyroidism: Secondary | ICD-10-CM

## 2022-03-10 DIAGNOSIS — R600 Localized edema: Secondary | ICD-10-CM

## 2022-03-10 DIAGNOSIS — E559 Vitamin D deficiency, unspecified: Secondary | ICD-10-CM | POA: Diagnosis not present

## 2022-03-10 DIAGNOSIS — E669 Obesity, unspecified: Secondary | ICD-10-CM

## 2022-03-10 MED ORDER — POLYSACCHARIDE IRON COMPLEX 150 MG PO CAPS: 150.0000 mg | ORAL_CAPSULE | Freq: Every day | ORAL | 0 refills | Status: DC

## 2022-03-10 MED ORDER — PHENTERMINE-TOPIRAMATE ER 11.25-69 MG PO CP24: ORAL_CAPSULE | ORAL | 0 refills | Status: DC

## 2022-03-10 MED ORDER — FUROSEMIDE 20 MG PO TABS: 20.0000 mg | ORAL_TABLET | ORAL | 0 refills | Status: DC

## 2022-03-10 MED ORDER — VITAMIN D (ERGOCALCIFEROL) 1.25 MG (50000 UNIT) PO CAPS
50000.0000 [IU] | ORAL_CAPSULE | ORAL | 0 refills | Status: DC
Start: 2022-03-10 — End: 2022-04-14

## 2022-03-11 NOTE — Progress Notes (Unsigned)
Chief Complaint:   OBESITY Isabella Larson is here to discuss her progress with her obesity treatment plan along with follow-up of her obesity related diagnoses. Isabella Larson is on the Category 4 Plan and states she is following her eating plan approximately 30% of the time. Isabella Larson states she is doing 0 minutes 0 times per week.  Today's visit was #: 45 Starting weight: 305 lbs Starting date: 04/18/2019 Today's weight: 295 lbs Today's date: 03/10/2022 Total lbs lost to date: 10 Total lbs lost since last in-office visit: 0  Interim History: Isabella Larson is up 3 pounds since her last visit.  She is taking Qsymia for appetite and cravings.   Subjective:   1. Other specified hypothyroidism Isabella Larson is taking Synthroid currently.  2. Edema of both lower extremities Isabella Larson is taking Lasix, and finds it helpful.  3. Other iron deficiency anemia Isabella Larson's labs iron/anemia panel, Ferritin was 9, iron saturation was 10.  4. Vitamin D deficiency Isabella Larson is taking vitamin D OTC.  Labs vitamin D level decreased to 28.9.  Assessment/Plan:   1. Other specified hypothyroidism Isabella Larson will continue taking Synthroid.  2. Edema of both lower extremities Isabella Larson will continue Lasix 20 mg, and we will refill for 1 month.  - furosemide (LASIX) 20 MG tablet; Take 1 tablet (20 mg total) by mouth every other day.  Dispense: 30 tablet; Refill: 0  3. Other iron deficiency anemia Isabella Larson will continue Nu-iron 150 mg, and we will refill for 1 month.  - iron polysaccharides (NU-IRON) 150 MG capsule; Take 1 capsule (150 mg total) by mouth daily.  Dispense: 30 capsule; Refill: 0  4. Vitamin D deficiency Isabella Larson will continue prescription vitamin D 50,000 units weekly, and we will refill for 1 month.  - Vitamin D, Ergocalciferol, (DRISDOL) 1.25 MG (50000 UNIT) CAPS capsule; Take 1 capsule (50,000 Units total) by mouth every 7 (seven) days.  Dispense: 4 capsule; Refill: 0  5. Obesity with current BMI  of 44.9 Isabella Larson is currently in the action stage of change. As such, her goal is to continue with weight loss efforts. She has agreed to the Category 4 Plan.   Meal planning.  Intentional eating.  We discussed various medication options to help Isabella Larson with her weight loss efforts and we both agreed to continue Qsymia, and we will refill for 1 month.  - Phentermine-Topiramate 11.25-69 MG CP24; 1 capsule daily in the am.  Dispense: 30 capsule; Refill: 0  Exercise goals: No exercise has been prescribed at this time.  Behavioral modification strategies: increasing lean protein intake, decreasing simple carbohydrates, increasing vegetables, increasing water intake, decreasing eating out, no skipping meals, meal planning and cooking strategies, keeping healthy foods in the home, and planning for success.  Isabella Larson has agreed to follow-up with our clinic in 4 weeks. She was informed of the importance of frequent follow-up visits to maximize her success with intensive lifestyle modifications for her multiple health conditions.   Objective:   Blood pressure 118/80, pulse 63, temperature 98 F (36.7 C), height 5\' 8"  (1.727 m), weight 295 lb (133.8 kg), SpO2 98 %. Body mass index is 44.85 kg/m.  General: Cooperative, alert, well developed, in no acute distress. HEENT: Conjunctivae and lids unremarkable. Cardiovascular: Regular rhythm.  Lungs: Normal work of breathing. Neurologic: No focal deficits.   Lab Results  Component Value Date   CREATININE 0.64 07/22/2021   BUN 13 07/22/2021   NA 141 07/22/2021   K 5.0 07/22/2021   CL 105 07/22/2021   CO2  24 07/22/2021   Lab Results  Component Value Date   ALT 19 07/22/2021   AST 22 07/22/2021   ALKPHOS 98 07/22/2021   BILITOT 0.2 07/22/2021   Lab Results  Component Value Date   HGBA1C 6.4 (H) 02/10/2022   HGBA1C 6.0 (H) 07/22/2021   HGBA1C 6.3 (H) 10/30/2020   HGBA1C 6.3 (H) 04/26/2020   HGBA1C 6.0 (H) 12/20/2019   Lab Results   Component Value Date   INSULIN 14.2 02/10/2022   INSULIN 8.5 07/22/2021   INSULIN 13.1 10/30/2020   INSULIN 14.8 04/26/2020   INSULIN 9.8 12/20/2019   Lab Results  Component Value Date   TSH 4.090 02/10/2022   Lab Results  Component Value Date   CHOL 171 04/26/2020   HDL 56 04/26/2020   LDLCALC 102 (H) 04/26/2020   TRIG 65 04/26/2020   Lab Results  Component Value Date   VD25OH 28.9 (L) 02/10/2022   VD25OH 30.7 09/30/2021   VD25OH 38.7 06/03/2021   Lab Results  Component Value Date   WBC 6.2 08/23/2019   HGB 12.3 08/23/2019   HCT 38.9 09/30/2021   MCV 84 08/23/2019   PLT 292 08/23/2019   Lab Results  Component Value Date   IRON 39 02/10/2022   TIBC 399 02/10/2022   FERRITIN 9 (L) 02/10/2022   Attestation Statements:   Reviewed by clinician on day of visit: allergies, medications, problem list, medical history, surgical history, family history, social history, and previous encounter notes.   Trude Mcburney, am acting as Energy manager for [employer], DO.  I have reviewed the above documentation for accuracy and completeness, and I agree with the above. -  ***

## 2022-03-15 ENCOUNTER — Encounter (INDEPENDENT_AMBULATORY_CARE_PROVIDER_SITE_OTHER): Payer: Self-pay | Admitting: Bariatrics

## 2022-04-01 ENCOUNTER — Other Ambulatory Visit (INDEPENDENT_AMBULATORY_CARE_PROVIDER_SITE_OTHER): Payer: Self-pay | Admitting: Bariatrics

## 2022-04-01 DIAGNOSIS — D508 Other iron deficiency anemias: Secondary | ICD-10-CM

## 2022-04-14 ENCOUNTER — Ambulatory Visit (INDEPENDENT_AMBULATORY_CARE_PROVIDER_SITE_OTHER): Payer: BC Managed Care – PPO | Admitting: Bariatrics

## 2022-04-14 ENCOUNTER — Encounter (INDEPENDENT_AMBULATORY_CARE_PROVIDER_SITE_OTHER): Payer: Self-pay | Admitting: Bariatrics

## 2022-04-14 VITALS — BP 114/76 | HR 68 | Temp 97.9°F | Ht 68.0 in | Wt 292.0 lb

## 2022-04-14 DIAGNOSIS — D508 Other iron deficiency anemias: Secondary | ICD-10-CM

## 2022-04-14 DIAGNOSIS — Z6841 Body Mass Index (BMI) 40.0 and over, adult: Secondary | ICD-10-CM

## 2022-04-14 DIAGNOSIS — R600 Localized edema: Secondary | ICD-10-CM | POA: Diagnosis not present

## 2022-04-14 DIAGNOSIS — E038 Other specified hypothyroidism: Secondary | ICD-10-CM

## 2022-04-14 DIAGNOSIS — E559 Vitamin D deficiency, unspecified: Secondary | ICD-10-CM

## 2022-04-14 DIAGNOSIS — E669 Obesity, unspecified: Secondary | ICD-10-CM

## 2022-04-14 MED ORDER — POLYSACCHARIDE IRON COMPLEX 150 MG PO CAPS: 150.0000 mg | ORAL_CAPSULE | Freq: Every day | ORAL | 0 refills | Status: DC

## 2022-04-14 MED ORDER — VITAMIN D (ERGOCALCIFEROL) 1.25 MG (50000 UNIT) PO CAPS: 50000.0000 [IU] | ORAL_CAPSULE | ORAL | 0 refills | Status: DC

## 2022-04-14 MED ORDER — FUROSEMIDE 20 MG PO TABS: 20.0000 mg | ORAL_TABLET | ORAL | 0 refills | Status: DC

## 2022-04-14 MED ORDER — POLYSACCHARIDE IRON COMPLEX 150 MG PO CAPS: 150.0000 mg | ORAL_CAPSULE | Freq: Two times a day (BID) | ORAL | 0 refills | Status: DC

## 2022-04-15 LAB — ANEMIA PANEL
Ferritin: 8 ng/mL — ABNORMAL LOW (ref 15–150)
Folate, Hemolysate: 382 ng/mL
Folate, RBC: 1058 ng/mL (ref >498)
Hematocrit: 36.1 % (ref 34.0–46.6)
Iron Saturation: 12 % — ABNORMAL LOW (ref 15–55)
Iron: 46 ug/dL (ref 27–159)
Retic Ct Pct: 1.3 % (ref 0.6–2.6)
Total Iron Binding Capacity: 397 ug/dL (ref 250–450)
UIBC: 351 ug/dL (ref 131–425)
Vitamin B-12: 564 pg/mL (ref 232–1245)

## 2022-04-15 LAB — VITAMIN D 25 HYDROXY (VIT D DEFICIENCY, FRACTURES): Vit D, 25-Hydroxy: 44.8 ng/mL (ref 30.0–100.0)

## 2022-04-15 LAB — TSH+T4F+T3FREE
Free T4: 1.35 ng/dL (ref 0.82–1.77)
T3, Free: 2.4 pg/mL (ref 2.0–4.4)
TSH: 10.2 u[IU]/mL — ABNORMAL HIGH (ref 0.450–4.500)

## 2022-04-15 NOTE — Progress Notes (Signed)
Spoke to patient and she has not been skipping dosages of medication. Confirmed pharmacy with patient as [facility] [address]

## 2022-04-16 ENCOUNTER — Other Ambulatory Visit (INDEPENDENT_AMBULATORY_CARE_PROVIDER_SITE_OTHER): Payer: Self-pay | Admitting: Bariatrics

## 2022-04-16 MED ORDER — LEVOTHYROXINE SODIUM 200 MCG PO TABS: 200.0000 ug | ORAL_TABLET | Freq: Every day | ORAL | 1 refills | Status: DC

## 2022-04-21 ENCOUNTER — Encounter (INDEPENDENT_AMBULATORY_CARE_PROVIDER_SITE_OTHER): Payer: Self-pay | Admitting: Bariatrics

## 2022-04-21 NOTE — Progress Notes (Signed)
Chief Complaint:   OBESITY Isabella Larson is here to discuss her progress with her obesity treatment plan along with follow-up of her obesity related diagnoses. Isabella Larson is on the Category 4 Plan and states she is following her eating plan approximately 20% of the time. Isabella Larson states she is doing 0 minutes 0 times per week.  Today's visit was #: 50 Starting weight: 305 lbs Starting date: 04/18/2019 Today's weight: 292 lbs Today's date: 04/14/2022 Total lbs lost to date: 13 Total lbs lost since last in-office visit: 3  Interim History: Isabella Larson is down another 3 pounds since her last visit.  Subjective:   1. Edema of both lower extremities Isabella Larson is on Lasix 20 mg once every other day.  2. Vitamin D deficiency Isabella Larson is taking prescription vitamin D once weekly.  3. Other iron deficiency anemia Isabella Larson is tolerating her iron well, and she is taking it as directed.  4. Other specified hypothyroidism Isabella Larson is taking her thyroid medications.  Assessment/Plan:   1. Edema of both lower extremities Isabella Larson will continue Lasix 20 mg every other day, and we will refill for 1 month.  - furosemide (LASIX) 20 MG tablet; Take 1 tablet (20 mg total) by mouth every other day.  Dispense: 30 tablet; Refill: 0  2. Vitamin D deficiency We will check labs today. Isabella Larson will continue prescription vitamin D 50,000 units once every 7 days, and we will refill for 1 month.  - Vitamin D, Ergocalciferol, (DRISDOL) 1.25 MG (50000 UNIT) CAPS capsule; Take 1 capsule (50,000 Units total) by mouth every 7 (seven) days.  Dispense: 4 capsule; Refill: 0 - VITAMIN D 25 Hydroxy (Vit-D Deficiency, Fractures)  3. Other iron deficiency anemia We will check labs today. We will refill Nu-Iron 150 mg BID for 1 month.  - Anemia panel - Reticulocytes; Future - iron polysaccharides (NU-IRON) 150 MG capsule; Take 1 capsule (150 mg total) by mouth 2 (two) times daily.  Dispense: 60 capsule; Refill:  0  4. Other specified hypothyroidism We will check labs today, and we will follow-up at Isabella Larson's next visit.  - TSH+T4F+T3Free  5. Obesity with current BMI of 44.5 Isabella Larson is currently in the action stage of change. As such, her goal is to continue with weight loss efforts. She has agreed to the Category 4 Plan.   Meal planning and intentional eating were discussed.  Exercise goals: No exercise has been prescribed at this time.  Behavioral modification strategies: increasing lean protein intake, decreasing simple carbohydrates, increasing vegetables, increasing water intake, decreasing eating out, no skipping meals, meal planning and cooking strategies, keeping healthy foods in the home, and planning for success.  Nedda has agreed to follow-up with our clinic in 4 weeks. She was informed of the importance of frequent follow-up visits to maximize her success with intensive lifestyle modifications for her multiple health conditions.   Isabella Larson was informed we would discuss her lab results at her next visit unless there is a critical issue that needs to be addressed sooner. Isabella Larson agreed to keep her next visit at the agreed upon time to discuss these results.  Objective:   Blood pressure 114/76, pulse 68, temperature 97.9 F (36.6 C), height 5\' 8"  (1.727 m), weight 292 lb (132.5 kg), SpO2 99 %. Body mass index is 44.4 kg/m.  General: Cooperative, alert, well developed, in no acute distress. HEENT: Conjunctivae and lids unremarkable. Cardiovascular: Regular rhythm.  Lungs: Normal work of breathing. Neurologic: No focal deficits.   Lab Results  Component Value  Date   CREATININE 0.64 07/22/2021   BUN 13 07/22/2021   NA 141 07/22/2021   K 5.0 07/22/2021   CL 105 07/22/2021   CO2 24 07/22/2021   Lab Results  Component Value Date   ALT 19 07/22/2021   AST 22 07/22/2021   ALKPHOS 98 07/22/2021   BILITOT 0.2 07/22/2021   Lab Results  Component Value Date   HGBA1C 6.4  (H) 02/10/2022   HGBA1C 6.0 (H) 07/22/2021   HGBA1C 6.3 (H) 10/30/2020   HGBA1C 6.3 (H) 04/26/2020   HGBA1C 6.0 (H) 12/20/2019   Lab Results  Component Value Date   INSULIN 14.2 02/10/2022   INSULIN 8.5 07/22/2021   INSULIN 13.1 10/30/2020   INSULIN 14.8 04/26/2020   INSULIN 9.8 12/20/2019   Lab Results  Component Value Date   TSH 10.200 (H) 04/14/2022   Lab Results  Component Value Date   CHOL 171 04/26/2020   HDL 56 04/26/2020   LDLCALC 102 (H) 04/26/2020   TRIG 65 04/26/2020   Lab Results  Component Value Date   VD25OH 44.8 04/14/2022   VD25OH 28.9 (L) 02/10/2022   VD25OH 30.7 09/30/2021   Lab Results  Component Value Date   WBC 6.2 08/23/2019   HGB 12.3 08/23/2019   HCT 36.1 04/14/2022   MCV 84 08/23/2019   PLT 292 08/23/2019   Lab Results  Component Value Date   IRON 46 04/14/2022   TIBC 397 04/14/2022   FERRITIN 8 (L) 04/14/2022   Attestation Statements:   Reviewed by clinician on day of visit: allergies, medications, problem list, medical history, surgical history, family history, social history, and previous encounter notes.   Trude Mcburney, am acting as Energy manager for [employer], DO.  I have reviewed the above documentation for accuracy and completeness, and I agree with the above. Corinna Capra, DO

## 2022-04-30 ENCOUNTER — Encounter (INDEPENDENT_AMBULATORY_CARE_PROVIDER_SITE_OTHER): Payer: Self-pay

## 2022-05-08 ENCOUNTER — Other Ambulatory Visit (INDEPENDENT_AMBULATORY_CARE_PROVIDER_SITE_OTHER): Payer: Self-pay | Admitting: Bariatrics

## 2022-05-08 DIAGNOSIS — D508 Other iron deficiency anemias: Secondary | ICD-10-CM

## 2022-05-29 NOTE — Progress Notes (Unsigned)
TeleHealth Visit:  This visit was completed with telemedicine (audio/video) technology. Jeff has verbally consented to this TeleHealth visit. The patient is located at home, the provider is located at home. The participants in this visit include the listed provider and patient. The visit was conducted today via MyChart video.  OBESITY Isabella Larson is here to discuss her progress with her obesity treatment plan along with follow-up of her obesity related diagnoses.   Today's visit was # 51 Starting weight: 305 lbs Starting date: 04/18/2019 Weight at last in office visit: 292 lbs on 04/14/22 Total weight loss: 13 lbs at last in office visit on 04/14/22. Today's reported weight: *** lbs No weight reported.  Nutrition Plan: the Category 4 Plan.  Hunger is {EWCONTROLASSESSMENT:24261}. Cravings are {EWCONTROLASSESSMENT:24261}.  Current exercise: {exercise types:16438}  Interim History: ***  Assessment/Plan:  1. ***  2. ***  3. ***  Obesity: Current BMI *** Isabella Larson {CHL AMB IS/IS NOT:210130109} currently in the action stage of change. As such, her goal is to {MWMwtloss#1:210800005}.  She has agreed to {MWMwtlossportion/plan2:23431}.   Exercise goals: {MWM EXERCISE RECS:23473}  Behavioral modification strategies: {MWMwtlossdietstrategies3:23432}.  Afua has agreed to follow-up with our clinic in {NUMBER 1-10:22536} weeks.   No orders of the defined types were placed in this encounter.   There are no discontinued medications.   No orders of the defined types were placed in this encounter.     Objective:   VITALS: Per patient if applicable, see vitals. GENERAL: Alert and in no acute distress. CARDIOPULMONARY: No increased WOB. Speaking in clear sentences.  PSYCH: Pleasant and cooperative. Speech normal rate and rhythm. Affect is appropriate. Insight and judgement are appropriate. Attention is focused, linear, and appropriate.  NEURO: Oriented as arrived to  appointment on time with no prompting.   Lab Results  Component Value Date   CREATININE 0.64 07/22/2021   BUN 13 07/22/2021   NA 141 07/22/2021   K 5.0 07/22/2021   CL 105 07/22/2021   CO2 24 07/22/2021   Lab Results  Component Value Date   ALT 19 07/22/2021   AST 22 07/22/2021   ALKPHOS 98 07/22/2021   BILITOT 0.2 07/22/2021   Lab Results  Component Value Date   HGBA1C 6.4 (H) 02/10/2022   HGBA1C 6.0 (H) 07/22/2021   HGBA1C 6.3 (H) 10/30/2020   HGBA1C 6.3 (H) 04/26/2020   HGBA1C 6.0 (H) 12/20/2019   Lab Results  Component Value Date   INSULIN 14.2 02/10/2022   INSULIN 8.5 07/22/2021   INSULIN 13.1 10/30/2020   INSULIN 14.8 04/26/2020   INSULIN 9.8 12/20/2019   Lab Results  Component Value Date   TSH 10.200 (H) 04/14/2022   Lab Results  Component Value Date   CHOL 171 04/26/2020   HDL 56 04/26/2020   LDLCALC 102 (H) 04/26/2020   TRIG 65 04/26/2020   Lab Results  Component Value Date   WBC 6.2 08/23/2019   HGB 12.3 08/23/2019   HCT 36.1 04/14/2022   MCV 84 08/23/2019   PLT 292 08/23/2019   Lab Results  Component Value Date   IRON 46 04/14/2022   TIBC 397 04/14/2022   FERRITIN 8 (L) 04/14/2022   Lab Results  Component Value Date   VD25OH 44.8 04/14/2022   VD25OH 28.9 (L) 02/10/2022   VD25OH 30.7 09/30/2021    Attestation Statements:   Reviewed by clinician on day of visit: allergies, medications, problem list, medical history, surgical history, family history, social history, and previous encounter notes.  ***(delete if time-based billing  not used) Time spent on visit including the items listed below was *** minutes.  -preparing to see the patient (e.g., review of tests, history, previous notes) -obtaining and/or reviewing separately obtained history -counseling and educating the patient/family/caregiver -documenting clinical information in the electronic or other health record -ordering medications, tests, or procedures -independently  interpreting results and communicating results to the patient/ family/caregiver -referring and communicating with other health care professionals  -care coordination

## 2022-06-02 ENCOUNTER — Encounter (INDEPENDENT_AMBULATORY_CARE_PROVIDER_SITE_OTHER): Payer: Self-pay | Admitting: Family Medicine

## 2022-06-02 ENCOUNTER — Ambulatory Visit (INDEPENDENT_AMBULATORY_CARE_PROVIDER_SITE_OTHER): Payer: BC Managed Care – PPO | Admitting: Family Medicine

## 2022-06-02 ENCOUNTER — Telehealth (INDEPENDENT_AMBULATORY_CARE_PROVIDER_SITE_OTHER): Payer: BC Managed Care – PPO | Admitting: Family Medicine

## 2022-06-02 DIAGNOSIS — E559 Vitamin D deficiency, unspecified: Secondary | ICD-10-CM

## 2022-06-02 DIAGNOSIS — R7303 Prediabetes: Secondary | ICD-10-CM

## 2022-06-02 DIAGNOSIS — D509 Iron deficiency anemia, unspecified: Secondary | ICD-10-CM

## 2022-06-02 DIAGNOSIS — D508 Other iron deficiency anemias: Secondary | ICD-10-CM

## 2022-06-02 DIAGNOSIS — Z6841 Body Mass Index (BMI) 40.0 and over, adult: Secondary | ICD-10-CM

## 2022-06-02 DIAGNOSIS — Z9884 Bariatric surgery status: Secondary | ICD-10-CM

## 2022-06-02 DIAGNOSIS — E669 Obesity, unspecified: Secondary | ICD-10-CM

## 2022-06-02 DIAGNOSIS — R600 Localized edema: Secondary | ICD-10-CM | POA: Diagnosis not present

## 2022-06-02 DIAGNOSIS — E038 Other specified hypothyroidism: Secondary | ICD-10-CM

## 2022-06-02 MED ORDER — POLYSACCHARIDE IRON COMPLEX 150 MG PO CAPS: 150.0000 mg | ORAL_CAPSULE | Freq: Two times a day (BID) | ORAL | 0 refills | Status: DC

## 2022-06-02 MED ORDER — FUROSEMIDE 20 MG PO TABS: 20.0000 mg | ORAL_TABLET | Freq: Every day | ORAL | 0 refills | Status: DC

## 2022-06-02 MED ORDER — METFORMIN HCL 500 MG PO TABS: 500.0000 mg | ORAL_TABLET | Freq: Every day | ORAL | 0 refills | Status: DC

## 2022-06-02 MED ORDER — VITAMIN D (ERGOCALCIFEROL) 1.25 MG (50000 UNIT) PO CAPS
50000.0000 [IU] | ORAL_CAPSULE | ORAL | 0 refills | Status: DC
Start: 2022-06-02 — End: 2022-06-26

## 2022-06-11 ENCOUNTER — Other Ambulatory Visit (INDEPENDENT_AMBULATORY_CARE_PROVIDER_SITE_OTHER): Payer: Self-pay | Admitting: Family Medicine

## 2022-06-11 DIAGNOSIS — R600 Localized edema: Secondary | ICD-10-CM

## 2022-06-26 ENCOUNTER — Ambulatory Visit (INDEPENDENT_AMBULATORY_CARE_PROVIDER_SITE_OTHER): Payer: BC Managed Care – PPO | Admitting: Family Medicine

## 2022-06-26 ENCOUNTER — Encounter (INDEPENDENT_AMBULATORY_CARE_PROVIDER_SITE_OTHER): Payer: Self-pay | Admitting: Family Medicine

## 2022-06-26 VITALS — BP 125/84 | HR 67 | Temp 97.9°F | Ht 68.0 in | Wt 296.0 lb

## 2022-06-26 DIAGNOSIS — R7303 Prediabetes: Secondary | ICD-10-CM | POA: Diagnosis not present

## 2022-06-26 DIAGNOSIS — D508 Other iron deficiency anemias: Secondary | ICD-10-CM

## 2022-06-26 DIAGNOSIS — K9189 Other postprocedural complications and disorders of digestive system: Secondary | ICD-10-CM

## 2022-06-26 DIAGNOSIS — Z6841 Body Mass Index (BMI) 40.0 and over, adult: Secondary | ICD-10-CM

## 2022-06-26 DIAGNOSIS — R600 Localized edema: Secondary | ICD-10-CM

## 2022-06-26 DIAGNOSIS — E669 Obesity, unspecified: Secondary | ICD-10-CM

## 2022-06-26 DIAGNOSIS — J40 Bronchitis, not specified as acute or chronic: Secondary | ICD-10-CM

## 2022-06-26 DIAGNOSIS — E559 Vitamin D deficiency, unspecified: Secondary | ICD-10-CM

## 2022-06-26 MED ORDER — VITAMIN D (ERGOCALCIFEROL) 1.25 MG (50000 UNIT) PO CAPS: 50000.0000 [IU] | ORAL_CAPSULE | ORAL | 0 refills | Status: DC

## 2022-06-26 MED ORDER — PHENTERMINE-TOPIRAMATE ER 11.25-69 MG PO CP24: ORAL_CAPSULE | ORAL | 0 refills | Status: DC

## 2022-06-26 MED ORDER — FUROSEMIDE 20 MG PO TABS: 20.0000 mg | ORAL_TABLET | Freq: Every day | ORAL | 0 refills | Status: DC

## 2022-06-26 MED ORDER — METFORMIN HCL 500 MG PO TABS: 500.0000 mg | ORAL_TABLET | Freq: Every day | ORAL | 0 refills | Status: DC

## 2022-06-26 MED ORDER — POLYSACCHARIDE IRON COMPLEX 150 MG PO CAPS: 150.0000 mg | ORAL_CAPSULE | Freq: Two times a day (BID) | ORAL | 0 refills | Status: DC

## 2022-06-27 LAB — IRON AND TIBC
Iron Saturation: 4 % — CL (ref 15–55)
Iron: 15 ug/dL — ABNORMAL LOW (ref 27–159)
Total Iron Binding Capacity: 397 ug/dL (ref 250–450)
UIBC: 382 ug/dL (ref 131–425)

## 2022-06-27 LAB — CBC WITH DIFFERENTIAL/PLATELET
Basophils Absolute: 0 10*3/uL (ref 0.0–0.2)
Basos: 1 %
EOS (ABSOLUTE): 0.2 10*3/uL (ref 0.0–0.4)
Eos: 3 %
Hematocrit: 31.4 % — ABNORMAL LOW (ref 34.0–46.6)
Hemoglobin: 10 g/dL — ABNORMAL LOW (ref 11.1–15.9)
Immature Grans (Abs): 0 10*3/uL (ref 0.0–0.1)
Immature Granulocytes: 0 %
Lymphocytes Absolute: 2.4 10*3/uL (ref 0.7–3.1)
Lymphs: 32 %
MCH: 24.9 pg — ABNORMAL LOW (ref 26.6–33.0)
MCHC: 31.8 g/dL (ref 31.5–35.7)
MCV: 78 fL — ABNORMAL LOW (ref 79–97)
Monocytes Absolute: 0.7 10*3/uL (ref 0.1–0.9)
Monocytes: 9 %
Neutrophils Absolute: 4.1 10*3/uL (ref 1.4–7.0)
Neutrophils: 55 %
Platelets: 330 10*3/uL (ref 150–450)
RBC: 4.01 x10E6/uL (ref 3.77–5.28)
RDW: 13.2 % (ref 11.7–15.4)
WBC: 7.5 10*3/uL (ref 3.4–10.8)

## 2022-06-27 LAB — FERRITIN: Ferritin: 9 ng/mL — ABNORMAL LOW (ref 15–150)

## 2022-06-27 LAB — FOLATE: Folate: 9.5 ng/mL (ref >3.0)

## 2022-07-01 NOTE — Progress Notes (Signed)
Chief Complaint:   OBESITY Isabella Larson is here to discuss her progress with her obesity treatment plan along with follow-up of her obesity related diagnoses. Isabella Larson is on the Category 4 Plan and states she is following her eating plan approximately 30% of the time. Isabella Larson states she is walking for 40 minutes 3 times per week.  Today's visit was #: 52 Starting weight: 305 lbs Starting date: 04/18/2019 Today's weight: 296 lbs Today's date: 06/26/2022 Total lbs lost to date: 9 Total lbs lost since last in-office visit: 0  Interim History: Isabella Larson recently returned from vacation from [place].  She did some celebration eating, but she also did a lot of walking.  She has bronchitis now and she is mostly increasing her sleep and eating soup etc.  Subjective:   1. Bronchitis Isabella Larson still notes a nonproductive cough.  She was diagnosed with bronchitis.  She has been on a Z-Pak and [employer] but this did not help much.  She is taking Mucinex.  2. 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  She is on iron prescription and she is due to have labs checked.  She denies constipation or worsening constipation.  3. Prediabetes Isabella Larson was prescribed metformin.  4. Vitamin D deficiency Isabella Larson is on vitamin D prescription, with no side effects noted.  5. Edema of both lower extremities Isabella Larson is taking Lasix regularly.  She has not had a formal evaluation for causes.  Assessment/Plan:   1. Bronchitis Isabella Larson is to take OTC Delsym for now and we will follow-up at her next visit.  2. 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  We will follow-up at Isabella Larson's next visit.  - CBC with Differential/Platelet - Folate - Iron and TIBC - Ferritin - iron polysaccharides (NU-IRON) 150 MG capsule;  Take 1 capsule (150 mg total) by mouth 2 (two) times daily.  Dispense: 60 capsule; Refill: 0  3. Prediabetes Isabella Larson will continue metformin 500 mg once daily with breakfast, and we will refill for 1 month.  - metFORMIN (GLUCOPHAGE) 500 MG tablet; Take 1 tablet (500 mg total) by mouth daily with breakfast.  Dispense: 30 tablet; Refill: 0  4. Vitamin D deficiency We will refill prescription Vitamin D for 1 month.  Isabella Larson will follow-up for routine testing of Vitamin D, at least 2-3 times per year to avoid over-replacement.  - Vitamin D, Ergocalciferol, (DRISDOL) 1.25 MG (50000 UNIT) CAPS capsule; Take 1 capsule (50,000 Units total) by mouth every 7 (seven) days.  Dispense: 4 capsule; Refill: 0  5. Edema of both lower extremities We will refill Lasix 20 mg daily for 1 month.  Isabella Larson is to take sporadically and discussed this with her PCP as this can cause renal damage over long periods of time.  - furosemide (LASIX) 20 MG tablet; Take 1-2 tablets (20-40 mg total) by mouth daily.  Dispense: 30 tablet; Refill: 0  6. Obesity with current BMI of 45.1 Isabella Larson is currently in the action stage of change. As such, her goal is to continue with weight loss efforts. She has agreed to the Category 4 Plan.   We discussed various medication options to help Isabella Larson with her weight loss efforts and we both agreed to continue Qsymia, and we will refill for 1 month.  - Phentermine-Topiramate 11.25-69 MG CP24; 1 capsule daily  in the am.  Dispense: 30 capsule; Refill: 0  Exercise goals: As is.   Behavioral modification strategies: increasing lean protein intake.  Isabella Larson has agreed to follow-up with our clinic in 3 to 4 weeks. She was informed of the importance of frequent follow-up visits to maximize her success with intensive lifestyle modifications for her multiple health conditions.   Isabella Larson was informed we would discuss her lab results at her next visit unless there is a critical issue that  needs to be addressed sooner. Isabella Larson agreed to keep her next visit at the agreed upon time to discuss these results.  Objective:   Blood pressure 125/84, pulse 67, temperature 97.9 F (36.6 C), height 5\' 8"  (1.727 m), weight 296 lb (134.3 kg), SpO2 99 %. Body mass index is 45.01 kg/m.  General: Cooperative, alert, well developed, in no acute distress. HEENT: Conjunctivae and lids unremarkable. Cardiovascular: Regular rhythm.  Lungs: Normal work of breathing. Neurologic: No focal deficits.   Lab Results  Component Value Date   CREATININE 0.64 07/22/2021   BUN 13 07/22/2021   NA 141 07/22/2021   K 5.0 07/22/2021   CL 105 07/22/2021   CO2 24 07/22/2021   Lab Results  Component Value Date   ALT 19 07/22/2021   AST 22 07/22/2021   ALKPHOS 98 07/22/2021   BILITOT 0.2 07/22/2021   Lab Results  Component Value Date   HGBA1C 6.4 (H) 02/10/2022   HGBA1C 6.0 (H) 07/22/2021   HGBA1C 6.3 (H) 10/30/2020   HGBA1C 6.3 (H) 04/26/2020   HGBA1C 6.0 (H) 12/20/2019   Lab Results  Component Value Date   INSULIN 14.2 02/10/2022   INSULIN 8.5 07/22/2021   INSULIN 13.1 10/30/2020   INSULIN 14.8 04/26/2020   INSULIN 9.8 12/20/2019   Lab Results  Component Value Date   TSH 10.200 (H) 04/14/2022   Lab Results  Component Value Date   CHOL 171 04/26/2020   HDL 56 04/26/2020   LDLCALC 102 (H) 04/26/2020   TRIG 65 04/26/2020   Lab Results  Component Value Date   VD25OH 44.8 04/14/2022   VD25OH 28.9 (L) 02/10/2022   VD25OH 30.7 09/30/2021   Lab Results  Component Value Date   WBC 7.5 06/26/2022   HGB 10.0 (L) 06/26/2022   HCT 31.4 (L) 06/26/2022   MCV 78 (L) 06/26/2022   PLT 330 06/26/2022   Lab Results  Component Value Date   IRON 15 (L) 06/26/2022   TIBC 397 06/26/2022   FERRITIN 9 (L) 06/26/2022   Attestation Statements:   Reviewed by clinician on day of visit: allergies, medications, problem list, medical history, surgical history, family history, social history,  and previous encounter notes.   I, Trixie Dredge, am acting as transcriptionist for Dennard Nip, MD.  I have reviewed the above documentation for accuracy and completeness, and I agree with the above. -  Dennard Nip, MD

## 2022-07-14 ENCOUNTER — Other Ambulatory Visit (INDEPENDENT_AMBULATORY_CARE_PROVIDER_SITE_OTHER): Payer: Self-pay | Admitting: Family Medicine

## 2022-07-14 DIAGNOSIS — R7303 Prediabetes: Secondary | ICD-10-CM

## 2022-07-22 ENCOUNTER — Encounter (INDEPENDENT_AMBULATORY_CARE_PROVIDER_SITE_OTHER): Payer: Self-pay | Admitting: Family Medicine

## 2022-07-22 ENCOUNTER — Ambulatory Visit (INDEPENDENT_AMBULATORY_CARE_PROVIDER_SITE_OTHER): Payer: BC Managed Care – PPO | Admitting: Family Medicine

## 2022-07-22 VITALS — BP 133/84 | HR 89 | Temp 99.0°F | Ht 68.0 in | Wt 293.0 lb

## 2022-07-22 DIAGNOSIS — R7303 Prediabetes: Secondary | ICD-10-CM | POA: Diagnosis not present

## 2022-07-22 DIAGNOSIS — E038 Other specified hypothyroidism: Secondary | ICD-10-CM | POA: Diagnosis not present

## 2022-07-22 DIAGNOSIS — E559 Vitamin D deficiency, unspecified: Secondary | ICD-10-CM | POA: Diagnosis not present

## 2022-07-22 DIAGNOSIS — D508 Other iron deficiency anemias: Secondary | ICD-10-CM | POA: Diagnosis not present

## 2022-07-22 DIAGNOSIS — E669 Obesity, unspecified: Secondary | ICD-10-CM

## 2022-07-22 DIAGNOSIS — R600 Localized edema: Secondary | ICD-10-CM

## 2022-07-22 DIAGNOSIS — Z6841 Body Mass Index (BMI) 40.0 and over, adult: Secondary | ICD-10-CM

## 2022-07-22 MED ORDER — VITAMIN D (ERGOCALCIFEROL) 1.25 MG (50000 UNIT) PO CAPS: 50000.0000 [IU] | ORAL_CAPSULE | ORAL | 0 refills | Status: DC

## 2022-07-22 MED ORDER — FUSION PLUS PO CAPS: 1.0000 | ORAL_CAPSULE | Freq: Every day | ORAL | 0 refills | Status: DC

## 2022-07-22 MED ORDER — LEVOTHYROXINE SODIUM 200 MCG PO TABS: 200.0000 ug | ORAL_TABLET | Freq: Every day | ORAL | 1 refills | Status: DC

## 2022-07-22 MED ORDER — FUROSEMIDE 20 MG PO TABS: 20.0000 mg | ORAL_TABLET | Freq: Every day | ORAL | 0 refills | Status: DC

## 2022-07-22 MED ORDER — METFORMIN HCL 500 MG PO TABS: 500.0000 mg | ORAL_TABLET | Freq: Every day | ORAL | 0 refills | Status: DC

## 2022-07-29 ENCOUNTER — Ambulatory Visit (INDEPENDENT_AMBULATORY_CARE_PROVIDER_SITE_OTHER): Payer: BC Managed Care – PPO | Admitting: Family Medicine

## 2022-08-16 ENCOUNTER — Other Ambulatory Visit (INDEPENDENT_AMBULATORY_CARE_PROVIDER_SITE_OTHER): Payer: Self-pay | Admitting: Family Medicine

## 2022-08-16 DIAGNOSIS — R7303 Prediabetes: Secondary | ICD-10-CM

## 2022-08-20 ENCOUNTER — Ambulatory Visit (INDEPENDENT_AMBULATORY_CARE_PROVIDER_SITE_OTHER): Payer: BC Managed Care – PPO | Admitting: Family Medicine

## 2022-08-20 ENCOUNTER — Encounter (INDEPENDENT_AMBULATORY_CARE_PROVIDER_SITE_OTHER): Payer: Self-pay | Admitting: Family Medicine

## 2022-08-20 VITALS — BP 108/75 | HR 70 | Temp 97.9°F | Ht 68.0 in | Wt 282.0 lb

## 2022-08-20 DIAGNOSIS — Z6841 Body Mass Index (BMI) 40.0 and over, adult: Secondary | ICD-10-CM | POA: Diagnosis not present

## 2022-08-20 DIAGNOSIS — E668 Other obesity: Secondary | ICD-10-CM

## 2022-08-20 DIAGNOSIS — E65 Localized adiposity: Secondary | ICD-10-CM

## 2022-08-20 DIAGNOSIS — R7303 Prediabetes: Secondary | ICD-10-CM

## 2022-08-20 MED ORDER — TOPIRAMATE 100 MG PO TABS: 100.0000 mg | ORAL_TABLET | Freq: Every day | ORAL | 0 refills | Status: DC

## 2022-08-20 MED ORDER — PHENTERMINE HCL 15 MG PO CAPS: 15.0000 mg | ORAL_CAPSULE | Freq: Every day | ORAL | 0 refills | Status: DC

## 2022-08-20 MED ORDER — METFORMIN HCL 500 MG PO TABS: 500.0000 mg | ORAL_TABLET | Freq: Every day | ORAL | 0 refills | Status: DC

## 2022-08-28 NOTE — Progress Notes (Signed)
Chief Complaint:   OBESITY Isabella Larson is here to discuss her progress with her obesity treatment plan along with follow-up of her obesity related diagnoses. Isabella Larson is on the Category 4 Plan and states she is following her eating plan approximately 40-45% of the time. Isabella Larson states she is exercising 0 minutes 0 times per week.  Today's visit was #: 53 Starting weight: 305 lbs Starting date: 04/18/2019 Today's weight: 293 lbs Today's date: 07/22/2022 Total lbs lost to date: 12 lbs Total lbs lost since last in-office visit: 3  Interim History: Isabella Larson's 1st visit with me; previously saw Dr. Manson Passey and most recently with Dr. Dalbert Garnet. Struggles with dinner prep. Feeling tired by dinner. She shops [employer] and [address]. Wondering how to make dinner easier to adhere to.  Subjective:   1. 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  2. Vitamin D deficiency Isabella Larson is currently taking prescription Vit D 50,000 IU once a week.  Denies any nausea, vomiting or muscle weakness. She notes fatigue.  3. Other specified hypothyroidism Isabella Larson's last TSH 10.2. Denies any chest pain,shortness of breath or palpitations.  4. Prediabetes Isabella Larson's last A1c was 6.4, insulin 14.2. She is on Metformin. Denies GI side effects.  5. Edema of both lower extremities Present bilaterally. On Lasix ( amount depends on swelling).  Assessment/Plan:   1. 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  -Start Iron-FA-B Cmp-C-Biot-Probiotic (FUSION PLUS) CAPS; Take 1 capsule by mouth daily.  Dispense: 90 capsule; Refill: 0  2. Vitamin D deficiency We will refill Vit D 50K IU once a week for 1 month with 0 refills.  -Refill Vitamin D, Ergocalciferol, (DRISDOL) 1.25 MG (50000 UNIT) CAPS capsule; Take 1 capsule (50,000 Units total) by mouth every 7 (seven) days.  Dispense: 4 capsule; Refill:  0  3. Other specified hypothyroidism We will refill Levothyroxine 200 mcg daily for 1 month with 0 refills.  -Refill levothyroxine (SYNTHROID) 200 MCG tablet; Take 1 tablet (200 mcg total) by mouth daily.  Dispense: 30 tablet; Refill: 1  4. Prediabetes We will refill Metformin 500 mg daily for 1 month with 0 refills.  5. Edema of both lower extremities We will refill Lasix 20 mg daily for 1 month with 0 refills.  -Refill furosemide (LASIX) 20 MG tablet; Take 1-2 tablets (20-40 mg total) by mouth daily.  Dispense: 30 tablet; Refill: 0  6. Obesity with current BMI of 44.7 Isabella Larson is currently in the action stage of change. As such, her goal is to continue with weight loss efforts. She has agreed to keeping a food journal and adhering to recommended goals of 450-600 calories and 40+ grams protein at supper.   Exercise goals: No exercise has been prescribed at this time.  Behavioral modification strategies: increasing lean protein intake, meal planning and cooking strategies, keeping healthy foods in the home, and planning for success.  Isabella Larson has agreed to follow-up with our clinic in 4 weeks. She was informed of the importance of frequent follow-up visits to maximize her success with intensive lifestyle modifications for her multiple health conditions.   Objective:   Blood pressure 133/84, pulse 89, temperature 99 F (37.2 C), height 5\' 8"  (1.727 m), weight 293 lb (132.9 kg), SpO2 100 %. Body mass index is 44.55 kg/m.  General: Cooperative, alert, well developed, in no acute distress. HEENT: Conjunctivae and lids unremarkable. Cardiovascular: Regular rhythm.  Lungs: Normal work of breathing.  Neurologic: No focal deficits.   Lab Results  Component Value Date   CREATININE 0.64 07/22/2021   BUN 13 07/22/2021   NA 141 07/22/2021   K 5.0 07/22/2021   CL 105 07/22/2021   CO2 24 07/22/2021   Lab Results  Component Value Date   ALT 19 07/22/2021   AST 22 07/22/2021    ALKPHOS 98 07/22/2021   BILITOT 0.2 07/22/2021   Lab Results  Component Value Date   HGBA1C 6.4 (H) 02/10/2022   HGBA1C 6.0 (H) 07/22/2021   HGBA1C 6.3 (H) 10/30/2020   HGBA1C 6.3 (H) 04/26/2020   HGBA1C 6.0 (H) 12/20/2019   Lab Results  Component Value Date   INSULIN 14.2 02/10/2022   INSULIN 8.5 07/22/2021   INSULIN 13.1 10/30/2020   INSULIN 14.8 04/26/2020   INSULIN 9.8 12/20/2019   Lab Results  Component Value Date   TSH 10.200 (H) 04/14/2022   Lab Results  Component Value Date   CHOL 171 04/26/2020   HDL 56 04/26/2020   LDLCALC 102 (H) 04/26/2020   TRIG 65 04/26/2020   Lab Results  Component Value Date   VD25OH 44.8 04/14/2022   VD25OH 28.9 (L) 02/10/2022   VD25OH 30.7 09/30/2021   Lab Results  Component Value Date   WBC 7.5 06/26/2022   HGB 10.0 (L) 06/26/2022   HCT 31.4 (L) 06/26/2022   MCV 78 (L) 06/26/2022   PLT 330 06/26/2022   Lab Results  Component Value Date   IRON 15 (L) 06/26/2022   TIBC 397 06/26/2022   FERRITIN 9 (L) 06/26/2022   Attestation Statements:   Reviewed by clinician on day of visit: allergies, medications, problem list, medical history, surgical history, family history, social history, and previous encounter notes.  I, Fortino Sic, RMA am acting as transcriptionist for Reuben Likes, MD.  I have reviewed the above documentation for accuracy and completeness, and I agree with the above. - Reuben Likes, MD

## 2022-09-03 NOTE — Progress Notes (Signed)
Chief Complaint:   OBESITY Isabella Larson is here to discuss her progress with her obesity treatment plan along with follow-up of her obesity related diagnoses. Isabella Larson is on the Category 4 Plan and states she is following her eating plan approximately 50% of the time. Isabella Larson states she is exercising 0 minutes 0 times per week.  Today's visit was #: 54 Starting weight: 305 lbs Starting date: 04/18/2019 Today's weight: 282 lbs Today's date: 08/20/2022 Total lbs lost to date: 23 lbs Total lbs lost since last in-office visit: 11 lbs  Interim History: Isabella Larson feels like she is doing better with meals and getting a bit more aware of frequent of snacking. Smartfood popcorn is her indulgence. Wants to improve her morning protein intake. Wondering how she can make breakfast a bit easier.   Subjective:   1. Visceral obesity Isabella Larson started 1st treatment dose 06/2021 at 292 lbs---goal would be 5% weight loss by end of Jan 2022, which she has not achieved.  2. Prediabetes Isabella Larson's last A1c was 6.4 in May. She is on Metformin 500 mg daily.  Assessment/Plan:   1. Visceral obesity Increase/Refill Topamax 100 mg daily AND Increase/refill Phentermine 15 mg daily for 1 month with 0 refills.  -Increase/Refill topiramate (TOPAMAX) 100 MG tablet; Take 1 tablet (100 mg total) by mouth daily.  Dispense: 30 tablet; Refill: 0  -Increase/Refill phentermine 15 MG capsule; Take 1 capsule (15 mg total) by mouth daily.  Dispense: 30 capsule; Refill: 0  2. Prediabetes We will refill Metformin 500 mg daily for 3 months with 0 refills.  -Refill metFORMIN (GLUCOPHAGE) 500 MG tablet; Take 1 tablet (500 mg total) by mouth daily with breakfast.  Dispense: 90 tablet; Refill: 0  3. Obesity with current BMI of 42.9 Isabella Larson is currently in the action stage of change. As such, her goal is to continue with weight loss efforts. She has agreed to the Category 4 Plan.   Exercise goals: All adults should avoid  inactivity. Some physical activity is better than none, and adults who participate in any amount of physical activity gain some health benefits.  Behavioral modification strategies: increasing lean protein intake, meal planning and cooking strategies, keeping healthy foods in the home, emotional eating strategies, and planning for success.  Isabella Larson has agreed to follow-up with our clinic in 3 weeks. She was informed of the importance of frequent follow-up visits to maximize her success with intensive lifestyle modifications for her multiple health conditions.   Objective:   Blood pressure 108/75, pulse 70, temperature 97.9 F (36.6 C), height 5\' 8"  (1.727 m), weight 282 lb (127.9 kg), last menstrual period 08/07/2022, SpO2 100 %. Body mass index is 42.88 kg/m.  General: Cooperative, alert, well developed, in no acute distress. HEENT: Conjunctivae and lids unremarkable. Cardiovascular: Regular rhythm.  Lungs: Normal work of breathing. Neurologic: No focal deficits.   Lab Results  Component Value Date   CREATININE 0.64 07/22/2021   BUN 13 07/22/2021   NA 141 07/22/2021   K 5.0 07/22/2021   CL 105 07/22/2021   CO2 24 07/22/2021   Lab Results  Component Value Date   ALT 19 07/22/2021   AST 22 07/22/2021   ALKPHOS 98 07/22/2021   BILITOT 0.2 07/22/2021   Lab Results  Component Value Date   HGBA1C 6.4 (H) 02/10/2022   HGBA1C 6.0 (H) 07/22/2021   HGBA1C 6.3 (H) 10/30/2020   HGBA1C 6.3 (H) 04/26/2020   HGBA1C 6.0 (H) 12/20/2019   Lab Results  Component Value Date  INSULIN 14.2 02/10/2022   INSULIN 8.5 07/22/2021   INSULIN 13.1 10/30/2020   INSULIN 14.8 04/26/2020   INSULIN 9.8 12/20/2019   Lab Results  Component Value Date   TSH 10.200 (H) 04/14/2022   Lab Results  Component Value Date   CHOL 171 04/26/2020   HDL 56 04/26/2020   LDLCALC 102 (H) 04/26/2020   TRIG 65 04/26/2020   Lab Results  Component Value Date   VD25OH 44.8 04/14/2022   VD25OH 28.9 (L)  02/10/2022   VD25OH 30.7 09/30/2021   Lab Results  Component Value Date   WBC 7.5 06/26/2022   HGB 10.0 (L) 06/26/2022   HCT 31.4 (L) 06/26/2022   MCV 78 (L) 06/26/2022   PLT 330 06/26/2022   Lab Results  Component Value Date   IRON 15 (L) 06/26/2022   TIBC 397 06/26/2022   FERRITIN 9 (L) 06/26/2022   Attestation Statements:   Reviewed by clinician on day of visit: allergies, medications, problem list, medical history, surgical history, family history, social history, and previous encounter notes.  I, Fortino Sic, RMA am acting as transcriptionist for Reuben Likes, MD.  I have reviewed the above documentation for accuracy and completeness, and I agree with the above. - Reuben Likes, MD

## 2022-09-11 ENCOUNTER — Ambulatory Visit (INDEPENDENT_AMBULATORY_CARE_PROVIDER_SITE_OTHER): Payer: BC Managed Care – PPO | Admitting: Family Medicine

## 2022-09-11 ENCOUNTER — Encounter (INDEPENDENT_AMBULATORY_CARE_PROVIDER_SITE_OTHER): Payer: Self-pay | Admitting: Family Medicine

## 2022-09-11 VITALS — BP 117/61 | HR 65 | Temp 97.8°F | Ht 68.0 in | Wt 276.0 lb

## 2022-09-11 DIAGNOSIS — R7303 Prediabetes: Secondary | ICD-10-CM | POA: Diagnosis not present

## 2022-09-11 DIAGNOSIS — E038 Other specified hypothyroidism: Secondary | ICD-10-CM

## 2022-09-11 DIAGNOSIS — E669 Obesity, unspecified: Secondary | ICD-10-CM

## 2022-09-11 DIAGNOSIS — Z6841 Body Mass Index (BMI) 40.0 and over, adult: Secondary | ICD-10-CM

## 2022-09-11 DIAGNOSIS — D508 Other iron deficiency anemias: Secondary | ICD-10-CM | POA: Diagnosis not present

## 2022-09-11 DIAGNOSIS — E559 Vitamin D deficiency, unspecified: Secondary | ICD-10-CM

## 2022-09-11 DIAGNOSIS — R632 Polyphagia: Secondary | ICD-10-CM

## 2022-09-11 DIAGNOSIS — E7849 Other hyperlipidemia: Secondary | ICD-10-CM

## 2022-09-12 ENCOUNTER — Other Ambulatory Visit (INDEPENDENT_AMBULATORY_CARE_PROVIDER_SITE_OTHER): Payer: Self-pay | Admitting: Family Medicine

## 2022-09-12 DIAGNOSIS — E65 Localized adiposity: Secondary | ICD-10-CM

## 2022-09-12 LAB — ANEMIA PANEL
Ferritin: 13 ng/mL — ABNORMAL LOW (ref 15–150)
Folate, Hemolysate: 430 ng/mL
Folate, RBC: 1132 ng/mL (ref >498)
Hematocrit: 38 % (ref 34.0–46.6)
Iron Saturation: 14 % — ABNORMAL LOW (ref 15–55)
Iron: 49 ug/dL (ref 27–159)
Retic Ct Pct: 1.3 % (ref 0.6–2.6)
Total Iron Binding Capacity: 341 ug/dL (ref 250–450)
UIBC: 292 ug/dL (ref 131–425)
Vitamin B-12: 527 pg/mL (ref 232–1245)

## 2022-09-12 LAB — COMPREHENSIVE METABOLIC PANEL
ALT: 14 IU/L (ref 0–32)
AST: 15 IU/L (ref 0–40)
Albumin/Globulin Ratio: 1.7 (ref 1.2–2.2)
Albumin: 4.2 g/dL (ref 3.9–4.9)
Alkaline Phosphatase: 106 IU/L (ref 44–121)
BUN/Creatinine Ratio: 21 (ref 9–23)
BUN: 15 mg/dL (ref 6–24)
Bilirubin Total: 0.3 mg/dL (ref 0.0–1.2)
CO2: 22 mmol/L (ref 20–29)
Calcium: 9.5 mg/dL (ref 8.7–10.2)
Chloride: 106 mmol/L (ref 96–106)
Creatinine, Ser: 0.7 mg/dL (ref 0.57–1.00)
Globulin, Total: 2.5 g/dL (ref 1.5–4.5)
Glucose: 102 mg/dL — ABNORMAL HIGH (ref 70–99)
Potassium: 4.2 mmol/L (ref 3.5–5.2)
Sodium: 141 mmol/L (ref 134–144)
Total Protein: 6.7 g/dL (ref 6.0–8.5)
eGFR: 112 mL/min/{1.73_m2} (ref >59)

## 2022-09-12 LAB — LIPID PANEL WITH LDL/HDL RATIO
Cholesterol, Total: 169 mg/dL (ref 100–199)
HDL: 66 mg/dL (ref >39)
LDL Chol Calc (NIH): 91 mg/dL (ref 0–99)
LDL/HDL Ratio: 1.4 ratio (ref 0.0–3.2)
Triglycerides: 62 mg/dL (ref 0–149)
VLDL Cholesterol Cal: 12 mg/dL (ref 5–40)

## 2022-09-12 LAB — CBC WITH DIFFERENTIAL/PLATELET
Basophils Absolute: 0 10*3/uL (ref 0.0–0.2)
Basos: 1 %
EOS (ABSOLUTE): 0 10*3/uL (ref 0.0–0.4)
Eos: 1 %
Hemoglobin: 11.6 g/dL (ref 11.1–15.9)
Immature Grans (Abs): 0 10*3/uL (ref 0.0–0.1)
Immature Granulocytes: 0 %
Lymphocytes Absolute: 1.7 10*3/uL (ref 0.7–3.1)
Lymphs: 32 %
MCH: 25.2 pg — ABNORMAL LOW (ref 26.6–33.0)
MCHC: 30.5 g/dL — ABNORMAL LOW (ref 31.5–35.7)
MCV: 82 fL (ref 79–97)
Monocytes Absolute: 0.5 10*3/uL (ref 0.1–0.9)
Monocytes: 9 %
Neutrophils Absolute: 2.9 10*3/uL (ref 1.4–7.0)
Neutrophils: 57 %
Platelets: 297 10*3/uL (ref 150–450)
RBC: 4.61 x10E6/uL (ref 3.77–5.28)
RDW: 16.6 % — ABNORMAL HIGH (ref 11.7–15.4)
WBC: 5.1 10*3/uL (ref 3.4–10.8)

## 2022-09-12 LAB — INSULIN, RANDOM: INSULIN: 11.3 u[IU]/mL (ref 2.6–24.9)

## 2022-09-12 LAB — VITAMIN D 25 HYDROXY (VIT D DEFICIENCY, FRACTURES): Vit D, 25-Hydroxy: 61.1 ng/mL (ref 30.0–100.0)

## 2022-09-12 LAB — HEMOGLOBIN A1C
Est. average glucose Bld gHb Est-mCnc: 120 mg/dL
Hgb A1c MFr Bld: 5.8 % — ABNORMAL HIGH (ref 4.8–5.6)

## 2022-10-02 ENCOUNTER — Other Ambulatory Visit (INDEPENDENT_AMBULATORY_CARE_PROVIDER_SITE_OTHER): Payer: Self-pay | Admitting: Family Medicine

## 2022-10-02 DIAGNOSIS — R600 Localized edema: Secondary | ICD-10-CM

## 2022-10-02 DIAGNOSIS — E559 Vitamin D deficiency, unspecified: Secondary | ICD-10-CM

## 2022-10-02 DIAGNOSIS — E65 Localized adiposity: Secondary | ICD-10-CM

## 2022-10-06 ENCOUNTER — Other Ambulatory Visit (INDEPENDENT_AMBULATORY_CARE_PROVIDER_SITE_OTHER): Payer: Self-pay | Admitting: Family Medicine

## 2022-10-06 DIAGNOSIS — E559 Vitamin D deficiency, unspecified: Secondary | ICD-10-CM

## 2022-10-06 DIAGNOSIS — R600 Localized edema: Secondary | ICD-10-CM

## 2022-10-06 DIAGNOSIS — E65 Localized adiposity: Secondary | ICD-10-CM

## 2022-10-06 DIAGNOSIS — K9189 Other postprocedural complications and disorders of digestive system: Secondary | ICD-10-CM

## 2022-10-09 ENCOUNTER — Encounter (INDEPENDENT_AMBULATORY_CARE_PROVIDER_SITE_OTHER): Payer: Self-pay | Admitting: Family Medicine

## 2022-10-09 ENCOUNTER — Ambulatory Visit (INDEPENDENT_AMBULATORY_CARE_PROVIDER_SITE_OTHER): Payer: BC Managed Care – PPO | Admitting: Family Medicine

## 2022-10-09 VITALS — BP 137/76 | HR 78 | Temp 98.2°F | Ht 68.0 in

## 2022-10-09 DIAGNOSIS — R600 Localized edema: Secondary | ICD-10-CM

## 2022-10-09 DIAGNOSIS — E669 Obesity, unspecified: Secondary | ICD-10-CM

## 2022-10-09 DIAGNOSIS — E559 Vitamin D deficiency, unspecified: Secondary | ICD-10-CM

## 2022-10-09 DIAGNOSIS — D508 Other iron deficiency anemias: Secondary | ICD-10-CM | POA: Diagnosis not present

## 2022-10-09 DIAGNOSIS — R632 Polyphagia: Secondary | ICD-10-CM | POA: Diagnosis not present

## 2022-10-09 DIAGNOSIS — Z6841 Body Mass Index (BMI) 40.0 and over, adult: Secondary | ICD-10-CM

## 2022-10-09 MED ORDER — FUROSEMIDE 20 MG PO TABS: 20.0000 mg | ORAL_TABLET | Freq: Every day | ORAL | 0 refills | Status: DC

## 2022-10-09 MED ORDER — TOPIRAMATE 100 MG PO TABS: 100.0000 mg | ORAL_TABLET | Freq: Every day | ORAL | 0 refills | Status: DC

## 2022-10-09 MED ORDER — VITAMIN D (ERGOCALCIFEROL) 1.25 MG (50000 UNIT) PO CAPS: 50000.0000 [IU] | ORAL_CAPSULE | ORAL | 0 refills | Status: DC

## 2022-10-09 MED ORDER — PHENTERMINE HCL 15 MG PO CAPS: 15.0000 mg | ORAL_CAPSULE | Freq: Every day | ORAL | 0 refills | Status: DC

## 2022-10-10 ENCOUNTER — Encounter (INDEPENDENT_AMBULATORY_CARE_PROVIDER_SITE_OTHER): Payer: Self-pay | Admitting: Family Medicine

## 2022-10-16 ENCOUNTER — Other Ambulatory Visit (INDEPENDENT_AMBULATORY_CARE_PROVIDER_SITE_OTHER): Payer: Self-pay | Admitting: Family Medicine

## 2022-10-16 DIAGNOSIS — E038 Other specified hypothyroidism: Secondary | ICD-10-CM

## 2022-10-20 ENCOUNTER — Other Ambulatory Visit (INDEPENDENT_AMBULATORY_CARE_PROVIDER_SITE_OTHER): Payer: Self-pay | Admitting: Family Medicine

## 2022-10-20 DIAGNOSIS — E038 Other specified hypothyroidism: Secondary | ICD-10-CM

## 2022-10-21 NOTE — Progress Notes (Signed)
Chief Complaint:   OBESITY Isabella Larson is here to discuss her progress with her obesity treatment plan along with follow-up of her obesity related diagnoses. Isabella Larson is on the Category 4 Plan and states she is following her eating plan approximately 15% of the time. Isabella Larson states she is exercising 0 minutes 0 times per week.  Today's visit was #: 63 Starting weight: 305 lbs Starting date: 04/18/2019 Today's weight: 278 lbs Today's date: 10/09/2022 Total lbs lost to date:27 lbs  Total lbs lost since last in-office visit: 0  Interim History: Isabella Larson had GI issues for around 2 to 2-1/2 weeks in between last appointment and today.  The last week she has finally been able to get back on plan.  She really could not get much protein in daily while sick stomach sick.  Subjective:   1. Vitamin D deficiency Labs discussed during visit today.  Isabella Larson is currently taking prescription Vit D 50,000 IU once a week.  Denies any nausea, vomiting or muscle weakness.  She notes fatigue.  2. Isabella Larson is on Phentermine/Topamax combo without side effects.  She has been off medication for 1 week.  Started at 292 lbs and she needs to lose 5% (down 14 lbs) which she has achieved.  3. Edema of both lower extremities On Lasix with good results in control of swelling.  4. Other iron deficiency anemia Taking Fusion Plus.  Significant improvement in Iron, Ferritin, MCV improving fatigue.  Assessment/Plan:   1. Vitamin D deficiency We will refill Vit D 50K IU once a week for 1 month with 0 refills.  -Refill Vitamin D, Ergocalciferol, (DRISDOL) 1.25 MG (50000 UNIT) CAPS capsule; Take 1 capsule (50,000 Units total) by mouth every 7 (seven) days.  Dispense: 4 capsule; Refill: 0  2. Polyphagia Will refill Phentermine 15 mg by mouth daily and refill Topiramate 100 mg by mouth daily for 1 month with 0 refills.  -Refill phentermine 15 MG capsule; Take 1 capsule (15 mg total) by mouth daily.   Dispense: 30 capsule; Refill: 0  -Refill topiramate (TOPAMAX) 100 MG tablet; Take 1 tablet (100 mg total) by mouth daily.  Dispense: 30 tablet; Refill: 0  3. Edema of both lower extremities Will refill Lasix 20 to 40 mg by mouth daily for 1 month with 0 refills.  -Refill furosemide (LASIX) 20 MG tablet; Take 1-2 tablets (20-40 mg total) by mouth daily.  Dispense: 30 tablet; Refill: 0  4. Other iron deficiency anemia Continue fusion plus, multivitamin, no refills needed today.  5. Obesity with current BMI of 42.4 Isabella Larson is currently in the action stage of change. As such, her goal is to continue with weight loss efforts. She has agreed to the Category 4 Plan.   Exercise goals: All adults should avoid inactivity. Some physical activity is better than none, and adults who participate in any amount of physical activity gain some health benefits.  Behavioral modification strategies: increasing lean protein intake, meal planning and cooking strategies, keeping healthy foods in the home, and planning for success.  Isabella Larson has agreed to follow-up with our clinic in 4 weeks. She was informed of the importance of frequent follow-up visits to maximize her success with intensive lifestyle modifications for her multiple health conditions.   Objective:   Blood pressure 137/76, pulse 78, temperature 98.2 F (36.8 C), height 5\' 8"  (1.727 m), last menstrual period 09/04/2022, SpO2 99 %. Body mass index is 41.97 kg/m.  General: Cooperative, alert, well developed, in no acute distress.  HEENT: Conjunctivae and lids unremarkable. Cardiovascular: Regular rhythm.  Lungs: Normal work of breathing. Neurologic: No focal deficits.   Lab Results  Component Value Date   CREATININE 0.70 09/11/2022   BUN 15 09/11/2022   NA 141 09/11/2022   K 4.2 09/11/2022   CL 106 09/11/2022   CO2 22 09/11/2022   Lab Results  Component Value Date   ALT 14 09/11/2022   AST 15 09/11/2022   ALKPHOS 106 09/11/2022    BILITOT 0.3 09/11/2022   Lab Results  Component Value Date   HGBA1C 5.8 (H) 09/11/2022   HGBA1C 6.4 (H) 02/10/2022   HGBA1C 6.0 (H) 07/22/2021   HGBA1C 6.3 (H) 10/30/2020   HGBA1C 6.3 (H) 04/26/2020   Lab Results  Component Value Date   INSULIN 11.3 09/11/2022   INSULIN 14.2 02/10/2022   INSULIN 8.5 07/22/2021   INSULIN 13.1 10/30/2020   INSULIN 14.8 04/26/2020   Lab Results  Component Value Date   TSH 10.200 (H) 04/14/2022   Lab Results  Component Value Date   CHOL 169 09/11/2022   HDL 66 09/11/2022   LDLCALC 91 09/11/2022   TRIG 62 09/11/2022   Lab Results  Component Value Date   VD25OH 61.1 09/11/2022   VD25OH 44.8 04/14/2022   VD25OH 28.9 (L) 02/10/2022   Lab Results  Component Value Date   WBC 5.1 09/11/2022   HGB 11.6 09/11/2022   HCT 38.0 09/11/2022   MCV 82 09/11/2022   PLT 297 09/11/2022   Lab Results  Component Value Date   IRON 49 09/11/2022   TIBC 341 09/11/2022   FERRITIN 13 (L) 09/11/2022   Attestation Statements:   Reviewed by clinician on day of visit: allergies, medications, problem list, medical history, surgical history, family history, social history, and previous encounter notes.  I, Elnora Morrison, RMA am acting as transcriptionist for Coralie Common, MD. I have reviewed the above documentation for accuracy and completeness, and I agree with the above. - Coralie Common, MD

## 2022-10-28 NOTE — Progress Notes (Signed)
Chief Complaint:   OBESITY Isabella Larson is here to discuss her progress with her obesity treatment plan along with follow-up of her obesity related diagnoses. Isabella Larson is on the Category 4 Plan and states she is following her eating plan approximately 50% of the time. Isabella Larson states she is exercising 0 minutes 0 times per week.  Today's visit was #: 31 Starting weight: 305 lbs Starting date: 04/18/2019 Today's weight: 276 lbs Today's date: 09/11/2022 Total lbs lost to date: 29 lbs Total lbs lost since last in-office visit: 6  Interim History: Isabella Larson feels very proud with how well she has done with being mindful of controlling how much frequently she has indulged.  Has brought some increased protein snacks.  Having brunch on Christmas.  .  Subjective:   1. Prediabetes A1c 6.4 on last check.  2. Polyphagia Doing well on the combination medication.  PDMP checked.  3. Other specified hypothyroidism Last TSH at goal.  4. Other iron deficiency anemia On fusion plus now.  Iron 15, ferritin 9, MCV 78.  5. Other hyperlipidemia Last LDL minimally elevated.  6. Vitamin D deficiency Isabella Larson is currently taking prescription Vit D 50,000 IU once a week.  Denies any nausea, vomiting or muscle weakness.  She notes fatigue.  Assessment/Plan:   1. Prediabetes We will obtain labs today.  Continue metformin 500 mg daily.  - Comprehensive metabolic panel - Hemoglobin A1c - Insulin, random  2. Polyphagia Will refill Phentermine 15 mg by mouth daily #30 and refill Topiramate 100 mg by mouth daily #30.  3. Other specified hypothyroidism Will refill Synthroid 200 mcg by mouth daily for 3 months with 0 refills.  4. Other iron deficiency anemia We will obtain labs today.    - CBC w/Diff/Platelet - Anemia panel  5. Other hyperlipidemia We will obtain labs today.  - Lipid Panel With LDL/HDL Ratio  6. Vitamin D deficiency We will obtain labs today.  - VITAMIN D 25 Hydroxy  (Vit-D Deficiency, Fractures)  7. Obesity with current BMI of 42.1 [name] is currently in the action stage of change. As such, her goal is to continue with weight loss efforts. She has agreed to the Category 4 Plan.   Exercise goals: All adults should avoid inactivity. Some physical activity is better than none, and adults who participate in any amount of physical activity gain some health benefits.  Behavioral modification strategies: increasing lean protein intake, meal planning and cooking strategies, keeping healthy foods in the home, and planning for success.  Keyonna has agreed to follow-up with our clinic in 3 weeks. She was informed of the importance of frequent follow-up visits to maximize her success with intensive lifestyle modifications for her multiple health conditions.   [name] was informed we would discuss her lab results at her next visit unless there is a critical issue that needs to be addressed sooner. [name] agreed to keep her next visit at the agreed upon time to discuss these results.  Objective:   Blood pressure 117/61, pulse 65, temperature 97.8 F (36.6 C), height 5\' 8"  (1.727 m), weight 276 lb (125.2 kg), last menstrual period 09/04/2022, SpO2 100 %. Body mass index is 41.97 kg/m.  General: Cooperative, alert, well developed, in no acute distress. HEENT: Conjunctivae and lids unremarkable. Cardiovascular: Regular rhythm.  Lungs: Normal work of breathing. Neurologic: No focal deficits.   Lab Results  Component Value Date   CREATININE 0.70 09/11/2022   BUN 15 09/11/2022   NA 141 09/11/2022   K  4.2 09/11/2022   CL 106 09/11/2022   CO2 22 09/11/2022   Lab Results  Component Value Date   ALT 14 09/11/2022   AST 15 09/11/2022   ALKPHOS 106 09/11/2022   BILITOT 0.3 09/11/2022   Lab Results  Component Value Date   HGBA1C 5.8 (H) 09/11/2022   HGBA1C 6.4 (H) 02/10/2022   HGBA1C 6.0 (H) 07/22/2021   HGBA1C 6.3 (H) 10/30/2020   HGBA1C 6.3 (H)  04/26/2020   Lab Results  Component Value Date   INSULIN 11.3 09/11/2022   INSULIN 14.2 02/10/2022   INSULIN 8.5 07/22/2021   INSULIN 13.1 10/30/2020   INSULIN 14.8 04/26/2020   Lab Results  Component Value Date   TSH 10.200 (H) 04/14/2022   Lab Results  Component Value Date   CHOL 169 09/11/2022   HDL 66 09/11/2022   LDLCALC 91 09/11/2022   TRIG 62 09/11/2022   Lab Results  Component Value Date   VD25OH 61.1 09/11/2022   VD25OH 44.8 04/14/2022   VD25OH 28.9 (L) 02/10/2022   Lab Results  Component Value Date   WBC 5.1 09/11/2022   HGB 11.6 09/11/2022   HCT 38.0 09/11/2022   MCV 82 09/11/2022   PLT 297 09/11/2022   Lab Results  Component Value Date   IRON 49 09/11/2022   TIBC 341 09/11/2022   FERRITIN 13 (L) 09/11/2022   Attestation Statements:   Reviewed by clinician on day of visit: allergies, medications, problem list, medical history, surgical history, family history, social history, and previous encounter notes.  I, Elnora Morrison, RMA am acting as transcriptionist for Coralie Common, MD.  I have reviewed the above documentation for accuracy and completeness, and I agree with the above. - Coralie Common, MD

## 2022-10-30 ENCOUNTER — Ambulatory Visit (INDEPENDENT_AMBULATORY_CARE_PROVIDER_SITE_OTHER): Payer: BC Managed Care – PPO | Admitting: Family Medicine

## 2022-10-30 ENCOUNTER — Encounter (INDEPENDENT_AMBULATORY_CARE_PROVIDER_SITE_OTHER): Payer: Self-pay | Admitting: Family Medicine

## 2022-10-30 VITALS — BP 112/73 | HR 80 | Temp 98.1°F | Ht 68.0 in | Wt 275.0 lb

## 2022-10-30 DIAGNOSIS — R600 Localized edema: Secondary | ICD-10-CM | POA: Diagnosis not present

## 2022-10-30 DIAGNOSIS — E038 Other specified hypothyroidism: Secondary | ICD-10-CM | POA: Diagnosis not present

## 2022-10-30 DIAGNOSIS — K9189 Other postprocedural complications and disorders of digestive system: Secondary | ICD-10-CM | POA: Diagnosis not present

## 2022-10-30 DIAGNOSIS — E669 Obesity, unspecified: Secondary | ICD-10-CM

## 2022-10-30 DIAGNOSIS — D508 Other iron deficiency anemias: Secondary | ICD-10-CM

## 2022-10-30 DIAGNOSIS — Z6841 Body Mass Index (BMI) 40.0 and over, adult: Secondary | ICD-10-CM

## 2022-10-30 MED ORDER — FUROSEMIDE 20 MG PO TABS: 20.0000 mg | ORAL_TABLET | Freq: Every day | ORAL | 0 refills | Status: DC

## 2022-10-30 MED ORDER — FUSION PLUS PO CAPS: 1.0000 | ORAL_CAPSULE | Freq: Every day | ORAL | 0 refills | Status: DC

## 2022-10-30 MED ORDER — TOPIRAMATE 100 MG PO TABS: 100.0000 mg | ORAL_TABLET | Freq: Every day | ORAL | 0 refills | Status: DC

## 2022-10-30 MED ORDER — PHENTERMINE HCL 15 MG PO CAPS: 15.0000 mg | ORAL_CAPSULE | Freq: Every day | ORAL | 0 refills | Status: DC

## 2022-10-30 MED ORDER — LEVOTHYROXINE SODIUM 200 MCG PO TABS: 200.0000 ug | ORAL_TABLET | Freq: Every day | ORAL | 1 refills | Status: DC

## 2022-10-30 NOTE — Progress Notes (Deleted)
Since last appointment patient has been mostly working as there are no breaks until Easter.  Weekends are for watching sports most of the time.  She has struggled to get a new weekend routine with the sports.  She notices when she does meal prep her week goes well.  The weeks she doesn't meal prep she tends to struggle with getting food on plan in. She has found a banquet meal of crustless pizza that she occassionally does for dinner. Needs refill of Phentermine/ Topiramate combo.  PDMP checked- no concerns.  Since starting this dose of combo patient has lost 7lbs.

## 2022-11-01 ENCOUNTER — Other Ambulatory Visit (INDEPENDENT_AMBULATORY_CARE_PROVIDER_SITE_OTHER): Payer: Self-pay | Admitting: Family Medicine

## 2022-11-01 DIAGNOSIS — R600 Localized edema: Secondary | ICD-10-CM

## 2022-11-07 ENCOUNTER — Other Ambulatory Visit (INDEPENDENT_AMBULATORY_CARE_PROVIDER_SITE_OTHER): Payer: Self-pay | Admitting: Family Medicine

## 2022-11-07 DIAGNOSIS — E559 Vitamin D deficiency, unspecified: Secondary | ICD-10-CM

## 2022-11-11 MED ORDER — VITAMIN D (ERGOCALCIFEROL) 1.25 MG (50000 UNIT) PO CAPS: 50000.0000 [IU] | ORAL_CAPSULE | ORAL | 0 refills | Status: DC

## 2022-11-13 NOTE — Progress Notes (Signed)
Chief Complaint:   OBESITY Isabella Larson is here to discuss her progress with her obesity treatment plan along with follow-up of her obesity related diagnoses. Isabella Larson is on the Category 4 Plan and states she is following her eating plan approximately 50% of the time. Isabella Larson states she is exercising 0 minutes 0 times per week.  Today's visit was #: 47 Starting weight: 305 lbs Starting date: 04/18/2019 Today's weight: 275 lbs Today's date: 10/30/2022 Total lbs lost to date: 30 lbs Total lbs lost since last in-office visit: 3  Interim History: Since last appointment Isabella Larson has been mostly working as there are no breaks until Easter.  Weekends are for watching sports most of the time.  She has struggled to get a new weekend routine with the sports.  She notices when she does meal prep her week goes well.  The weeks she doesn't meal prep she tends to struggle with getting food on plan in. She has found a banquet meal of crustless pizza that she occassionally does for dinner. Needs refill of Phentermine/ Topiramate combo.  PDMP checked- no concerns.  Since starting this dose of combo patient has lost 7lbs.  Subjective:   1. Other specified hypothyroidism Current Synthroid 200 mcg.  Last test was in July in epic.  2. Edema of both lower extremities On Lasix 20-40 mg grams as needed daily.  3. 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  Without GI side effects of iron.  Assessment/Plan:   1. Other specified hypothyroidism Will refill levothyroxine 200 mcg by mouth daily for 1 month with 0 refills.  TSH at next appointment.  -Refill levothyroxine (SYNTHROID) 200 MCG tablet; Take 1 tablet (200 mcg total) by mouth daily.  Dispense: 30 tablet; Refill: 1  2. Edema of both lower extremities Will refill Lasix 20-40 mg by mouth daily for the 3 months with 0 refills.  -Refill furosemide (LASIX) 20 MG tablet; Take 1-2 tablets (20-40 mg total) by mouth daily.  Dispense: 30  tablet; Refill: 0  3. 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  -Refill Iron-FA-B Cmp-C-Biot-Probiotic (FUSION PLUS) CAPS; Take 1 capsule by mouth daily.  Dispense: 90 capsule; Refill: 0  4. BMI 40.0-44.9, adult ([facility])  5. Obesity with starting BMI of 46.3 We refill Topamax 100 mg daily AND refill Phentermine 15 mg daily for 1 month with 0 refills.  -Refill topiramate (TOPAMAX) 100 MG tablet; Take 1 tablet (100 mg total) by mouth daily.  Dispense: 30 tablet; Refill: 0  -Refill phentermine 15 MG capsule; Take 1 capsule (15 mg total) by mouth daily.  Dispense: 30 capsule; Refill: 0  Isabella Larson is currently in the action stage of change. As such, her goal is to continue with weight loss efforts. She has agreed to the Category 4 Plan.   Exercise goals: All adults should avoid inactivity. Some physical activity is better than none, and adults who participate in any amount of physical activity gain some health benefits.  Patient is to start adding in 10 resistance training (resistant band workout sheets given).  Behavioral modification strategies: increasing lean protein intake, meal planning and cooking strategies, keeping healthy foods in the home, and planning for success.  Isabella Larson has agreed to follow-up with our clinic in 4 weeks. She was informed of the importance of frequent follow-up visits to maximize her success with intensive lifestyle modifications for her multiple health conditions.   Objective:   Blood pressure  112/73, pulse 80, temperature 98.1 F (36.7 C), height '5\' 8"'$  (1.727 m), weight 275 lb (124.7 kg), last menstrual period 10/26/2022, SpO2 100 %. Body mass index is 41.81 kg/m.  General: Cooperative, alert, well developed, in no acute distress. HEENT: Conjunctivae and lids unremarkable. Cardiovascular: Regular rhythm.  Lungs: Normal work of breathing. Neurologic: No focal deficits.   Lab Results   Component Value Date   CREATININE 0.70 09/11/2022   BUN 15 09/11/2022   NA 141 09/11/2022   K 4.2 09/11/2022   CL 106 09/11/2022   CO2 22 09/11/2022   Lab Results  Component Value Date   ALT 14 09/11/2022   AST 15 09/11/2022   ALKPHOS 106 09/11/2022   BILITOT 0.3 09/11/2022   Lab Results  Component Value Date   HGBA1C 5.8 (H) 09/11/2022   HGBA1C 6.4 (H) 02/10/2022   HGBA1C 6.0 (H) 07/22/2021   HGBA1C 6.3 (H) 10/30/2020   HGBA1C 6.3 (H) 04/26/2020   Lab Results  Component Value Date   INSULIN 11.3 09/11/2022   INSULIN 14.2 02/10/2022   INSULIN 8.5 07/22/2021   INSULIN 13.1 10/30/2020   INSULIN 14.8 04/26/2020   Lab Results  Component Value Date   TSH 10.200 (H) 04/14/2022   Lab Results  Component Value Date   CHOL 169 09/11/2022   HDL 66 09/11/2022   LDLCALC 91 09/11/2022   TRIG 62 09/11/2022   Lab Results  Component Value Date   VD25OH 61.1 09/11/2022   VD25OH 44.8 04/14/2022   VD25OH 28.9 (L) 02/10/2022   Lab Results  Component Value Date   WBC 5.1 09/11/2022   HGB 11.6 09/11/2022   HCT 38.0 09/11/2022   MCV 82 09/11/2022   PLT 297 09/11/2022   Lab Results  Component Value Date   IRON 49 09/11/2022   TIBC 341 09/11/2022   FERRITIN 13 (L) 09/11/2022   Attestation Statements:   Reviewed by clinician on day of visit: allergies, medications, problem list, medical history, surgical history, family history, social history, and previous encounter notes.  I, Elnora Morrison, RMA am acting as transcriptionist for Coralie Common, MD.  I have reviewed the above documentation for accuracy and completeness, and I agree with the above. - Coralie Common, MD

## 2022-11-27 ENCOUNTER — Encounter (INDEPENDENT_AMBULATORY_CARE_PROVIDER_SITE_OTHER): Payer: Self-pay | Admitting: Family Medicine

## 2022-11-27 ENCOUNTER — Ambulatory Visit (INDEPENDENT_AMBULATORY_CARE_PROVIDER_SITE_OTHER): Payer: BC Managed Care – PPO | Admitting: Family Medicine

## 2022-11-27 VITALS — BP 121/77 | HR 77 | Temp 97.3°F | Ht 68.0 in | Wt 282.0 lb

## 2022-11-27 DIAGNOSIS — Z6841 Body Mass Index (BMI) 40.0 and over, adult: Secondary | ICD-10-CM

## 2022-11-27 DIAGNOSIS — R600 Localized edema: Secondary | ICD-10-CM | POA: Diagnosis not present

## 2022-11-27 DIAGNOSIS — E669 Obesity, unspecified: Secondary | ICD-10-CM | POA: Diagnosis not present

## 2022-11-27 DIAGNOSIS — E038 Other specified hypothyroidism: Secondary | ICD-10-CM | POA: Diagnosis not present

## 2022-11-27 NOTE — Progress Notes (Deleted)
About a week after her last appointment she was helping her parents move furniture and aggravated her back.  She has seen the chiropractor and the lower back is improved but the upper back is now slightly sore.  Prior to getting injured she was doing her exercises with resistance.  She is dealing with more symptomatic menstrual cycles recently (currently on cycle).  Next few weeks life is very routine for her.  She has a small break over Easter.  Next few weeks she wants to start to branch out and eat different options.  Wondering about how to incorporate rice into her diet.

## 2022-11-29 MED ORDER — LEVOTHYROXINE SODIUM 200 MCG PO TABS: 200.0000 ug | ORAL_TABLET | Freq: Every day | ORAL | 0 refills | Status: DC

## 2022-11-29 MED ORDER — TOPIRAMATE 100 MG PO TABS: 100.0000 mg | ORAL_TABLET | Freq: Every day | ORAL | 0 refills | Status: DC

## 2022-11-29 MED ORDER — PHENTERMINE HCL 15 MG PO CAPS: 15.0000 mg | ORAL_CAPSULE | Freq: Every day | ORAL | 0 refills | Status: DC

## 2022-11-29 MED ORDER — FUROSEMIDE 20 MG PO TABS: 20.0000 mg | ORAL_TABLET | Freq: Every day | ORAL | 0 refills | Status: DC

## 2022-12-09 NOTE — Progress Notes (Signed)
Chief Complaint:   OBESITY Isabella Larson is here to discuss her progress with her obesity treatment plan along with follow-up of her obesity related diagnoses. Isabella Larson is on the Category 4 Plan and states she is following her eating plan approximately 50% of the time. Arlena states she is exercises 15-20 minutes 4 times per week.  Today's visit was #: 80 Starting weight: 305 lbs Starting date: 04/18/2019 Today's weight: 282 lbs Today's date: 11/27/2022 Total lbs lost to date: 23 lbs Total lbs lost since last in-office visit: +7 lbs  Interim History:  About a week after her last appointment she was helping her parents move furniture and aggravated her back.  She has seen the chiropractor and the lower back is improved but the upper back is now slightly sore.  Prior to getting injured she was doing her exercises with resistance.  She is dealing with more symptomatic menstrual cycles recently (currently on cycle).  Next few weeks life is very routine for her.  She has a small break over Easter.  Next few weeks she wants to start to branch out and eat different options.  Wondering about how to incorporate rice into her diet.    Subjective:   1. Other specified hypothyroidism Patient is on Levothyroxine 200 mcg daily.  Patient denies cold and heat intolerance, or palpitations.   2. Edema of both lower extremities Patient is on Lasix 1-2 tablets daily pending edema.  No side effects noted.  More feet swelling reported.    Assessment/Plan:   1. Other specified hypothyroidism Refill - levothyroxine (SYNTHROID) 200 MCG tablet; Take 1 tablet (200 mcg total) by mouth daily.  Dispense: 90 tablet; Refill: 0  2. Edema of both lower extremities Refill - furosemide (LASIX) 20 MG tablet; Take 1-2 tablets (20-40 mg total) by mouth daily.  Dispense: 30 tablet; Refill: 0  3. BMI 40.0-44.9, adult ([place])  4. Obesity with starting BMI of 46.3 Refill- phentermine 15 MG capsule; Take 1 capsule (15 mg  total) by mouth daily.  Dispense: 30 capsule; Refill: 0  Refill - topiramate (TOPAMAX) 100 MG tablet; Take 1 tablet (100 mg total) by mouth daily.  Dispense: 30 tablet; Refill: 0  [name] is currently in the action stage of change. As such, her goal is to continue with weight loss efforts. She has agreed to the Category 4 Plan.   Exercise goals: All adults should avoid inactivity. Some physical activity is better than none, and adults who participate in any amount of physical activity gain some health benefits.  Behavioral modification strategies: increasing lean protein intake, meal planning and cooking strategies, keeping healthy foods in the home, and planning for success.  [name] has agreed to follow-up with our clinic in 4 weeks. She was informed of the importance of frequent follow-up visits to maximize her success with intensive lifestyle modifications for her multiple health conditions.   Objective:   Blood pressure 121/77, pulse 77, temperature (!) 97.3 F (36.3 C), height 5\' 8"  (1.727 m), weight 282 lb (127.9 kg), last menstrual period 10/26/2022, SpO2 99 %. Body mass index is 42.88 kg/m.  General: Cooperative, alert, well developed, in no acute distress. HEENT: Conjunctivae and lids unremarkable. Cardiovascular: Regular rhythm.  Lungs: Normal work of breathing. Neurologic: No focal deficits.   Lab Results  Component Value Date   CREATININE 0.70 09/11/2022   BUN 15 09/11/2022   NA 141 09/11/2022   K 4.2 09/11/2022   CL 106 09/11/2022   CO2 22 09/11/2022  Lab Results  Component Value Date   ALT 14 09/11/2022   AST 15 09/11/2022   ALKPHOS 106 09/11/2022   BILITOT 0.3 09/11/2022   Lab Results  Component Value Date   HGBA1C 5.8 (H) 09/11/2022   HGBA1C 6.4 (H) 02/10/2022   HGBA1C 6.0 (H) 07/22/2021   HGBA1C 6.3 (H) 10/30/2020   HGBA1C 6.3 (H) 04/26/2020   Lab Results  Component Value Date   INSULIN 11.3 09/11/2022   INSULIN 14.2 02/10/2022   INSULIN 8.5  07/22/2021   INSULIN 13.1 10/30/2020   INSULIN 14.8 04/26/2020   Lab Results  Component Value Date   TSH 10.200 (H) 04/14/2022   Lab Results  Component Value Date   CHOL 169 09/11/2022   HDL 66 09/11/2022   LDLCALC 91 09/11/2022   TRIG 62 09/11/2022   Lab Results  Component Value Date   VD25OH 61.1 09/11/2022   VD25OH 44.8 04/14/2022   VD25OH 28.9 (L) 02/10/2022   Lab Results  Component Value Date   WBC 5.1 09/11/2022   HGB 11.6 09/11/2022   HCT 38.0 09/11/2022   MCV 82 09/11/2022   PLT 297 09/11/2022   Lab Results  Component Value Date   IRON 49 09/11/2022   TIBC 341 09/11/2022   FERRITIN 13 (L) 09/11/2022   Attestation Statements:   Reviewed by clinician on day of visit: allergies, medications, problem list, medical history, surgical history, family history, social history, and previous encounter notes.  I, Davy Pique, RMA, am acting as transcriptionist for Coralie Common, MD.  I have reviewed the above documentation for accuracy and completeness, and I agree with the above. - Coralie Common, MD

## 2022-12-25 ENCOUNTER — Encounter (INDEPENDENT_AMBULATORY_CARE_PROVIDER_SITE_OTHER): Payer: Self-pay | Admitting: Physician Assistant

## 2022-12-25 ENCOUNTER — Ambulatory Visit (INDEPENDENT_AMBULATORY_CARE_PROVIDER_SITE_OTHER): Payer: BC Managed Care – PPO | Admitting: Physician Assistant

## 2022-12-25 ENCOUNTER — Other Ambulatory Visit (INDEPENDENT_AMBULATORY_CARE_PROVIDER_SITE_OTHER): Payer: Self-pay | Admitting: Family Medicine

## 2022-12-25 VITALS — BP 111/75 | HR 80 | Temp 97.8°F | Ht 68.0 in | Wt 276.0 lb

## 2022-12-25 DIAGNOSIS — R600 Localized edema: Secondary | ICD-10-CM

## 2022-12-25 DIAGNOSIS — R7303 Prediabetes: Secondary | ICD-10-CM | POA: Diagnosis not present

## 2022-12-25 DIAGNOSIS — E559 Vitamin D deficiency, unspecified: Secondary | ICD-10-CM

## 2022-12-25 DIAGNOSIS — Z6841 Body Mass Index (BMI) 40.0 and over, adult: Secondary | ICD-10-CM

## 2022-12-25 DIAGNOSIS — E038 Other specified hypothyroidism: Secondary | ICD-10-CM

## 2022-12-25 DIAGNOSIS — E669 Obesity, unspecified: Secondary | ICD-10-CM

## 2022-12-25 DIAGNOSIS — R632 Polyphagia: Secondary | ICD-10-CM

## 2022-12-25 MED ORDER — PHENTERMINE HCL 15 MG PO CAPS
15.0000 mg | ORAL_CAPSULE | Freq: Every day | ORAL | 0 refills | Status: DC
Start: 2022-12-25 — End: 2023-01-22

## 2022-12-25 MED ORDER — FUROSEMIDE 20 MG PO TABS: 20.0000 mg | ORAL_TABLET | Freq: Every day | ORAL | 0 refills | Status: DC

## 2022-12-25 MED ORDER — TOPIRAMATE 100 MG PO TABS
100.0000 mg | ORAL_TABLET | Freq: Every day | ORAL | 0 refills | Status: DC
Start: 2022-12-25 — End: 2023-01-22

## 2022-12-25 MED ORDER — VITAMIN D (ERGOCALCIFEROL) 1.25 MG (50000 UNIT) PO CAPS: 50000.0000 [IU] | ORAL_CAPSULE | ORAL | 0 refills | Status: DC

## 2022-12-25 MED ORDER — LEVOTHYROXINE SODIUM 200 MCG PO TABS
200.0000 ug | ORAL_TABLET | Freq: Every day | ORAL | 0 refills | Status: DC
Start: 2022-12-25 — End: 2023-03-09

## 2022-12-25 MED ORDER — METFORMIN HCL 500 MG PO TABS
500.0000 mg | ORAL_TABLET | Freq: Every day | ORAL | 0 refills | Status: DC
Start: 2022-12-25 — End: 2023-05-05

## 2022-12-25 NOTE — Progress Notes (Signed)
Office: [phone]  /  Fax: [phone]  WEIGHT SUMMARY AND BIOMETRICS  Vitals Temp: 97.8 F (36.6 C) BP: 111/75 Pulse Rate: 80 SpO2: 100 %   Anthropometric Measurements Height: 5\' 8"  (1.727 m) Weight: 276 lb (125.2 kg) BMI (Calculated): 41.98 Weight at Last Visit: 282 lb Weight Lost Since Last Visit: 6 lb Weight Gained Since Last Visit: 0 lb Starting Weight: 305 lb Total Weight Loss (lbs): 29 lb (13.2 kg)   Body Composition  Body Fat %: 49.8 % Fat Mass (lbs): 137.8 lbs Muscle Mass (lbs): 131.8 lbs Total Body Water (lbs): 109.2 lbs Visceral Fat Rating : 14   Other Clinical Data Fasting: no Labs: no Today's Visit #: 3 Starting Date: 04/18/19     HPI  Chief Complaint: OBESITY  Isabella Larson is here to discuss her progress with her obesity treatment plan. She is on the the Category 4 Plan and states she is following her eating plan approximately 40 % of the time. She states she is exercising/walking/resistance bands 30 minutes 3 times per week.   Interval History:  Since last office visit she has done well . Down 6 lbs. She has been focusing on getting adequate protein. Not skipping meals.  Exercise- walking and strengthening 30 minutes 3 times weekly.   Has trip with family to [place] for celebrity golf tournament in 2 weeks!    Pharmacotherapy: Metformin for prediabetes              Phentermine /Topamax for polyphagia      PHYSICAL EXAM:  Blood pressure 111/75, pulse 80, temperature 97.8 F (36.6 C), height 5\' 8"  (1.727 m), weight 276 lb (125.2 kg), SpO2 100 %. Body mass index is 41.97 kg/m.  General: She is overweight, cooperative, alert, well developed, and in no acute distress. PSYCH: Has normal mood, affect and thought process.   Cardiovascular: Regular rhythm, HR 80's , some lower extremity edema (reports improved from previous)  Lungs: Normal breathing effort, no conversational dyspnea. Neuro: no focal deficits  DIAGNOSTIC DATA  REVIEWED:  BMET    Component Value Date/Time   NA 141 09/11/2022 0803   NA 141 09/23/2013 0932   K 4.2 09/11/2022 0803   K 3.7 09/23/2013 0932   CL 106 09/11/2022 0803   CO2 22 09/11/2022 0803   CO2 23 09/23/2013 0932   GLUCOSE 102 (H) 09/11/2022 0803   GLUCOSE 97 09/23/2013 0932   BUN 15 09/11/2022 0803   BUN 11.6 09/23/2013 0932   CREATININE 0.70 09/11/2022 0803   CREATININE 0.7 09/23/2013 0932   CALCIUM 9.5 09/11/2022 0803   CALCIUM 8.8 09/23/2013 0932   GFRNONAA 114 10/30/2020 0749   GFRAA 132 10/30/2020 0749   Lab Results  Component Value Date   HGBA1C 5.8 (H) 09/11/2022   HGBA1C 5.8 (H) 04/18/2019   Lab Results  Component Value Date   INSULIN 11.3 09/11/2022   INSULIN 15.7 04/18/2019   Lab Results  Component Value Date   TSH 10.200 (H) 04/14/2022   CBC    Component Value Date/Time   WBC 5.1 09/11/2022 0803   WBC 5.3 09/23/2013 0932   RBC 4.61 09/11/2022 0803   RBC 4.42 09/23/2013 0932   HGB 11.6 09/11/2022 0803   HGB 11.5 (L) 09/23/2013 0932   HCT 38.0 09/11/2022 0803   HCT 36.3 09/23/2013 0932   PLT 297 09/11/2022 0803   MCV 82 09/11/2022 0803   MCV 82.1 09/23/2013 0932   MCH 25.2 (L) 09/11/2022 0803   MCH 26.0  09/23/2013 0932   MCHC 30.5 (L) 09/11/2022 0803   MCHC 31.7 09/23/2013 0932   RDW 16.6 (H) 09/11/2022 0803   RDW 19.0 (H) 09/23/2013 0932   Iron Studies    Component Value Date/Time   IRON 49 09/11/2022 0803   TIBC 341 09/11/2022 0803   FERRITIN 13 (L) 09/11/2022 0803   IRONPCTSAT 14 (L) 09/11/2022 0803   Lipid Panel     Component Value Date/Time   CHOL 169 09/11/2022 0803   TRIG 62 09/11/2022 0803   HDL 66 09/11/2022 0803   LDLCALC 91 09/11/2022 0803   Hepatic Function Panel     Component Value Date/Time   PROT 6.7 09/11/2022 0803   PROT 6.8 09/23/2013 0932   ALBUMIN 4.2 09/11/2022 0803   ALBUMIN 3.6 09/23/2013 0932   AST 15 09/11/2022 0803   AST 18 09/23/2013 0932   ALT 14 09/11/2022 0803   ALT 20 09/23/2013 0932    ALKPHOS 106 09/11/2022 0803   ALKPHOS 87 09/23/2013 0932   BILITOT 0.3 09/11/2022 0803   BILITOT 0.38 09/23/2013 0932      Component Value Date/Time   TSH 10.200 (H) 04/14/2022 0834   Nutritional Lab Results  Component Value Date   VD25OH 61.1 09/11/2022   VD25OH 44.8 04/14/2022   VD25OH 28.9 (L) 02/10/2022    ASSOCIATED CONDITIONS ADDRESSED TODAY  ASSESSMENT AND PLAN  Problem List Items Addressed This Visit     Prediabetes - Primary    Prediabetes Last A1c was 5.8/ Insulin 11.3- improved  Medication(s): Metformin 500 mg once daily breakfast No GI side effects with metformin.  She is working on Camera operator to decrease simple carbohydrates, increase lean proteins and exercise to promote weight loss, improve glycemic control and prevent progression to Type 2 diabetes.   Lab Results  Component Value Date   HGBA1C 5.8 (H) 09/11/2022   HGBA1C 6.4 (H) 02/10/2022   HGBA1C 6.0 (H) 07/22/2021   HGBA1C 6.3 (H) 10/30/2020   HGBA1C 6.3 (H) 04/26/2020   Lab Results  Component Value Date   INSULIN 11.3 09/11/2022   INSULIN 14.2 02/10/2022   INSULIN 8.5 07/22/2021   INSULIN 13.1 10/30/2020   INSULIN 14.8 04/26/2020   Plan: Continue and refill Metformin 500 mg once daily breakfast Plan to recheck labs next visit.  Continue working on nutrition plan to decrease simple carbohydrates, increase lean proteins and exercise to promote weight loss, improve glycemic control and prevent progression to Type 2 diabetes.         Relevant Medications   metFORMIN (GLUCOPHAGE) 500 MG tablet   Vitamin D deficiency    Vitamin D Deficiency Vitamin D is at goal of 50-70.  Most recent vitamin D level was 61.1. She is on  prescription ergocalciferol 50,000 IU weekly. Lab Results  Component Value Date   VD25OH 61.1 09/11/2022   VD25OH 44.8 04/14/2022   VD25OH 28.9 (L) 02/10/2022   Plan: Continue and refill  prescription ergocalciferol 50,000 IU weekly Recheck vit D level next visit.   Low vitamin D levels can be associated with adiposity and may result in leptin resistance and weight gain. Also associated with fatigue. Currently on vitamin D supplementation without any adverse effects.         Relevant Medications   Vitamin D, Ergocalciferol, (DRISDOL) 1.25 MG (50000 UNIT) CAPS capsule   Edema of both lower extremities    Taking Lasix 20 mg to 40 mg daily when more noticeable. Reports lower extremity edema improved since starting Lasix. No  side effects with Lasix.  Plan: Continue/Refill Lasix 20-40 mg daily.  Will plan to recheck renal/lytes next visit.       Relevant Medications   furosemide (LASIX) 20 MG tablet   Polyphagia    Polyphagia Hillory has had issues with excessive hunger. This is currently well controlled. Medication(s): Topiramate 100 mg daily and Phentermine 15 mg by mouth daily in am.  No side effects with medication                  I have consulted the [place] Controlled Substances Registry for this patient, and feel the risk/benefit ratio today is favorable for proceeding with this prescription for a controlled substance. No aberrancies noted.   Plan: Continue and refill Topiramate 100 mg daily and Phentermine 15 mg by mouth daily in am Continue nutrition plan and exercise to promote weight loss.         Obesity with starting BMI of 46.3   Relevant Medications   metFORMIN (GLUCOPHAGE) 500 MG tablet   phentermine 15 MG capsule   topiramate (TOPAMAX) 100 MG tablet   BMI 40.0-44.9, adult Current BMI 42.1    Has done well with weight loss overall total weight loss 29 lbs.       Relevant Medications   metFORMIN (GLUCOPHAGE) 500 MG tablet   phentermine 15 MG capsule   Other specified hypothyroidism- post-surgical  (Chronic)    Hypothyroidism Stable.  Does not report symptoms associated with uncontrolled hypothyroidism. Medication(s): Levothyroxine 200 mcg daily. No side effects with medication. Follows regularly with Endocrinology- Dr.  Chalmers Cater Lab Results  Component Value Date   TSH 10.200 (H) 04/14/2022   Plan: Continue/Refill levothyroxine 200 mcg daily. Reports last TSH per Dr. Chalmers Cater was good and seeing regularly in follow up .  Continue nutrition plan and exercise to promote weight loss.  Can recheck TSH next visit with other labs .      Relevant Medications   levothyroxine (SYNTHROID) 200 MCG tablet   TREATMENT PLAN FOR OBESITY:  Recommended Dietary Goals  [name] is currently in the action stage of change. As such, her goal is to continue weight management plan. She has agreed to the Category 4 Plan.  Behavioral Intervention  We discussed the following Behavioral Modification Strategies today: increasing lean protein intake, decreasing simple carbohydrates , increasing fiber rich foods, avoiding skipping meals, increasing water intake, and emotional eating strategies and understanding the difference between hunger signals and cravings.  Additional resources provided today: NA  Recommended Physical Activity Goals  Deirdre has been advised to work up to 150 minutes of moderate intensity aerobic activity a week and strengthening exercises 2-3 times per week for cardiovascular health, weight loss maintenance and preservation of muscle mass.   She has agreed to Continue current level of physical activity    Pharmacotherapy We discussed various medication options to help Shonya with her weight loss efforts and we both agreed to continue Phentermine-topiramate 15-100 mg daily and nutritional and behavioral strategies to promote weight loss.    Return in about 4 weeks (around 01/22/2023).[name] She was informed of the importance of frequent follow up visits to maximize her success with intensive lifestyle modifications for her multiple health conditions.   ATTESTASTION STATEMENTS:  Reviewed by clinician on day of visit: allergies, medications, problem list, medical history, surgical history, family history,  social history, and previous encounter notes.   I have personally spent 36 minutes total time today in preparation, patient care, nutritional counseling and documentation for this visit,  including the following: review of clinical lab tests; review of medical tests/procedures/services.      Jerami Tammen, PA-C

## 2022-12-25 NOTE — Assessment & Plan Note (Signed)
Vitamin D Deficiency Vitamin D is at goal of 50-70.  Most recent vitamin D level was 61.1. She is on  prescription ergocalciferol 50,000 IU weekly. Lab Results  Component Value Date   VD25OH 61.1 09/11/2022   VD25OH 44.8 04/14/2022   VD25OH 28.9 (L) 02/10/2022    Plan: Continue and refill  prescription ergocalciferol 50,000 IU weekly Recheck vit D level next visit.  Low vitamin D levels can be associated with adiposity and may result in leptin resistance and weight gain. Also associated with fatigue. Currently on vitamin D supplementation without any adverse effects.

## 2022-12-25 NOTE — Assessment & Plan Note (Addendum)
Hypothyroidism Stable.  Does not report symptoms associated with uncontrolled hypothyroidism. Medication(s): Levothyroxine 200 mcg daily. No side effects with medication. Follows regularly with Endocrinology- Dr. Chalmers Cater Lab Results  Component Value Date   TSH 10.200 (H) 04/14/2022    Plan: Continue/Refill levothyroxine 200 mcg daily. Reports last TSH per Dr. Chalmers Cater was good and seeing regularly in follow up .  Continue nutrition plan and exercise to promote weight loss.  Can recheck TSH next visit with other labs .

## 2022-12-25 NOTE — Assessment & Plan Note (Signed)
Prediabetes Last A1c was 5.8/ Insulin 11.3- improved  Medication(s): Metformin 500 mg once daily breakfast No GI side effects with metformin.  She is working on Camera operator to decrease simple carbohydrates, increase lean proteins and exercise to promote weight loss, improve glycemic control and prevent progression to Type 2 diabetes.   Lab Results  Component Value Date   HGBA1C 5.8 (H) 09/11/2022   HGBA1C 6.4 (H) 02/10/2022   HGBA1C 6.0 (H) 07/22/2021   HGBA1C 6.3 (H) 10/30/2020   HGBA1C 6.3 (H) 04/26/2020   Lab Results  Component Value Date   INSULIN 11.3 09/11/2022   INSULIN 14.2 02/10/2022   INSULIN 8.5 07/22/2021   INSULIN 13.1 10/30/2020   INSULIN 14.8 04/26/2020    Plan: Continue and refill Metformin 500 mg once daily breakfast Plan to recheck labs next visit.  Continue working on nutrition plan to decrease simple carbohydrates, increase lean proteins and exercise to promote weight loss, improve glycemic control and prevent progression to Type 2 diabetes.

## 2022-12-25 NOTE — Assessment & Plan Note (Signed)
Polyphagia Isabella Larson has had issues with excessive hunger. This is currently well controlled. Medication(s): Topiramate 100 mg daily and Phentermine 15 mg by mouth daily in am.  No side effects with medication                  I have consulted the [place] Controlled Substances Registry for this patient, and feel the risk/benefit ratio today is favorable for proceeding with this prescription for a controlled substance. No aberrancies noted.   Plan: Continue and refill Topiramate 100 mg daily and Phentermine 15 mg by mouth daily in am Continue nutrition plan and exercise to promote weight loss.

## 2022-12-25 NOTE — Assessment & Plan Note (Signed)
Has done well with weight loss overall total weight loss 29 lbs.

## 2022-12-25 NOTE — Assessment & Plan Note (Signed)
Taking Lasix 20 mg to 40 mg daily when more noticeable. Reports lower extremity edema improved since starting Lasix. No side effects with Lasix.  Plan: Continue/Refill Lasix 20-40 mg daily.  Will plan to recheck renal/lytes next visit.

## 2023-01-22 ENCOUNTER — Ambulatory Visit (INDEPENDENT_AMBULATORY_CARE_PROVIDER_SITE_OTHER): Payer: BC Managed Care – PPO | Admitting: Family Medicine

## 2023-01-22 ENCOUNTER — Encounter (INDEPENDENT_AMBULATORY_CARE_PROVIDER_SITE_OTHER): Payer: Self-pay | Admitting: Family Medicine

## 2023-01-22 VITALS — BP 115/75 | HR 71 | Temp 98.0°F | Ht 68.0 in | Wt 281.0 lb

## 2023-01-22 DIAGNOSIS — R632 Polyphagia: Secondary | ICD-10-CM

## 2023-01-22 DIAGNOSIS — E559 Vitamin D deficiency, unspecified: Secondary | ICD-10-CM

## 2023-01-22 DIAGNOSIS — E669 Obesity, unspecified: Secondary | ICD-10-CM | POA: Diagnosis not present

## 2023-01-22 DIAGNOSIS — Z6841 Body Mass Index (BMI) 40.0 and over, adult: Secondary | ICD-10-CM

## 2023-01-22 NOTE — Progress Notes (Signed)
Chief Complaint:   OBESITY Isabella Larson is here to discuss her progress with her obesity treatment plan along with follow-up of her obesity related diagnoses. Isabella Larson is on the Category 4 Plan and states she is following her eating plan approximately 40% of the time. Isabella Larson states she is more walking and activities.  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  The last couple of weeks she did well staying compliant.  She went on vacation with her family to [place], [place] and felt that she struggled getting back on plan after coming back from her trip.  She is also not sleeping well.  That is affecting her mood and then leads her into her emotional eating. She also has increased cravings as she is around her period.  She is keeping her brother's kids this weekend.  She is sometime feeling less desire for the food choices she has in the fridge.   Subjective:   1. Polyphagia Patient is on combination phentermine/topiramate.  Patient starting weight 292 LBS and unfortunately patient has lost 5% and greater than 12 weeks at max dose.  2. Vitamin D deficiency Patient is on prescription Vitamin D.  Patient denies nausea, vomiting, muscle weakness, but is positive for fatigue.  Assessment/Plan:   1. Polyphagia Taper Lomaira 12 mg daily x 1 week then 8 mg daily x 1 week then 4 mg daily.  Taper topiramate 75 mg daily x 1 week then 50 mg daily x 1 week then 25 mg daily x 1 week.  Start naltrexone 25 mg p.o. nightly #30, 0 refills.  Refill - Phentermine HCl (LOMAIRA) 8 MG TABS; Take 1.5 tablets (12 mg total) by mouth daily for 7 days, THEN 1 tablet (8 mg total) daily for 7 days, THEN 0.5 tablets (4 mg total) daily for 7 days.  Dispense: 28 tablet; Refill: 0  Refill - topiramate  (TOPAMAX) 50 MG tablet; Take 1.5 tablets (75 mg total) by mouth daily for 7 days, THEN 1 tablet (50 mg total) daily for 7 days, THEN 0.5 tablets (25 mg total) daily for 7 days.  Dispense: 21 tablet; Refill: 0  Start - naltrexone (DEPADE) 50 MG tablet; Take 0.5 tablets (25 mg total) by mouth at bedtime.  Dispense: 30 tablet; Refill: 0  2. Vitamin D deficiency Refill Vitamin D 50,000 IU weekly, #4 no refills.   3. BMI 40.0-44.9, adult (HCC)  4. Obesity with starting BMI of 46.3 Refill - topiramate (TOPAMAX) 50 MG tablet; Take 1.5 tablets (75 mg total) by mouth daily for 7 days, THEN 1 tablet (50 mg total) daily for 7 days, THEN 0.5 tablets (25 mg total) daily for 7 days.  Dispense: 21 tablet; Refill: 0  Isabella Larson is currently in the action stage of change. As such, her goal is to continue with weight loss efforts. She has agreed to the Category 4 Plan.   Exercise goals: All adults should avoid inactivity. Some physical activity is better than none, and adults who participate in any amount of physical activity gain some health benefits.  Behavioral modification strategies: increasing lean protein intake, meal planning and cooking strategies, keeping healthy foods in the home, and planning for success.  Isabella Larson has agreed to follow-up with our clinic in 5 weeks. She was informed of  the importance of frequent follow-up visits to maximize her success with intensive lifestyle modifications for her multiple health conditions.   Objective:   Blood pressure 115/75, pulse 71, temperature 98 F (36.7 C), height 5\' 8"  (1.727 m), weight 281 lb (127.5 kg), SpO2 99 %. Body mass index is 42.73 kg/m.  General: Cooperative, alert, well developed, in no acute distress. HEENT: Conjunctivae and lids unremarkable. Cardiovascular: Regular rhythm.  Lungs: Normal work of breathing. Neurologic: No focal deficits.   Lab Results  Component Value Date   CREATININE 0.70 09/11/2022   BUN 15 09/11/2022   NA 141  09/11/2022   K 4.2 09/11/2022   CL 106 09/11/2022   CO2 22 09/11/2022   Lab Results  Component Value Date   ALT 14 09/11/2022   AST 15 09/11/2022   ALKPHOS 106 09/11/2022   BILITOT 0.3 09/11/2022   Lab Results  Component Value Date   HGBA1C 5.8 (H) 09/11/2022   HGBA1C 6.4 (H) 02/10/2022   HGBA1C 6.0 (H) 07/22/2021   HGBA1C 6.3 (H) 10/30/2020   HGBA1C 6.3 (H) 04/26/2020   Lab Results  Component Value Date   INSULIN 11.3 09/11/2022   INSULIN 14.2 02/10/2022   INSULIN 8.5 07/22/2021   INSULIN 13.1 10/30/2020   INSULIN 14.8 04/26/2020   Lab Results  Component Value Date   TSH 10.200 (H) 04/14/2022   Lab Results  Component Value Date   CHOL 169 09/11/2022   HDL 66 09/11/2022   LDLCALC 91 09/11/2022   TRIG 62 09/11/2022   Lab Results  Component Value Date   VD25OH 61.1 09/11/2022   VD25OH 44.8 04/14/2022   VD25OH 28.9 (L) 02/10/2022   Lab Results  Component Value Date   WBC 5.1 09/11/2022   HGB 11.6 09/11/2022   HCT 38.0 09/11/2022   MCV 82 09/11/2022   PLT 297 09/11/2022   Lab Results  Component Value Date   IRON 49 09/11/2022   TIBC 341 09/11/2022   FERRITIN 13 (L) 09/11/2022   Attestation Statements:   Reviewed by clinician on day of visit: allergies, medications, problem list, medical history, surgical history, family history, social history, and previous encounter notes.  I, Malcolm Metro, RMA, am acting as transcriptionist for Reuben Likes, MD. I have reviewed the above documentation for accuracy and completeness, and I agree with the above. - Reuben Likes, MD

## 2023-01-23 MED ORDER — LOMAIRA 8 MG PO TABS
ORAL_TABLET | ORAL | 0 refills | Status: DC
Start: 2023-01-23 — End: 2023-04-07

## 2023-01-23 MED ORDER — TOPIRAMATE 50 MG PO TABS
ORAL_TABLET | ORAL | 0 refills | Status: DC
Start: 2023-01-23 — End: 2023-04-07

## 2023-01-23 MED ORDER — NALTREXONE HCL 50 MG PO TABS
25.0000 mg | ORAL_TABLET | Freq: Every evening | ORAL | 0 refills | Status: DC
Start: 2023-01-23 — End: 2023-03-09

## 2023-01-31 MED ORDER — VITAMIN D (ERGOCALCIFEROL) 1.25 MG (50000 UNIT) PO CAPS: 50000.0000 [IU] | ORAL_CAPSULE | ORAL | 0 refills | Status: DC

## 2023-02-27 ENCOUNTER — Other Ambulatory Visit (INDEPENDENT_AMBULATORY_CARE_PROVIDER_SITE_OTHER): Payer: Self-pay | Admitting: Family Medicine

## 2023-02-27 DIAGNOSIS — E038 Other specified hypothyroidism: Secondary | ICD-10-CM

## 2023-03-02 ENCOUNTER — Ambulatory Visit (INDEPENDENT_AMBULATORY_CARE_PROVIDER_SITE_OTHER): Payer: BC Managed Care – PPO | Admitting: Family Medicine

## 2023-03-02 ENCOUNTER — Other Ambulatory Visit (INDEPENDENT_AMBULATORY_CARE_PROVIDER_SITE_OTHER): Payer: Self-pay | Admitting: Family Medicine

## 2023-03-02 DIAGNOSIS — E559 Vitamin D deficiency, unspecified: Secondary | ICD-10-CM

## 2023-03-09 ENCOUNTER — Encounter (INDEPENDENT_AMBULATORY_CARE_PROVIDER_SITE_OTHER): Payer: Self-pay | Admitting: Family Medicine

## 2023-03-09 ENCOUNTER — Ambulatory Visit (INDEPENDENT_AMBULATORY_CARE_PROVIDER_SITE_OTHER): Payer: BC Managed Care – PPO | Admitting: Family Medicine

## 2023-03-09 VITALS — BP 121/80 | HR 73 | Temp 98.0°F | Ht 68.0 in | Wt 285.0 lb

## 2023-03-09 DIAGNOSIS — E669 Obesity, unspecified: Secondary | ICD-10-CM

## 2023-03-09 DIAGNOSIS — R7989 Other specified abnormal findings of blood chemistry: Secondary | ICD-10-CM

## 2023-03-09 DIAGNOSIS — R632 Polyphagia: Secondary | ICD-10-CM

## 2023-03-09 DIAGNOSIS — K9189 Other postprocedural complications and disorders of digestive system: Secondary | ICD-10-CM

## 2023-03-09 DIAGNOSIS — R7303 Prediabetes: Secondary | ICD-10-CM

## 2023-03-09 DIAGNOSIS — D508 Other iron deficiency anemias: Secondary | ICD-10-CM

## 2023-03-09 DIAGNOSIS — E559 Vitamin D deficiency, unspecified: Secondary | ICD-10-CM | POA: Diagnosis not present

## 2023-03-09 DIAGNOSIS — Z6841 Body Mass Index (BMI) 40.0 and over, adult: Secondary | ICD-10-CM

## 2023-03-09 MED ORDER — LEVOTHYROXINE SODIUM 200 MCG PO TABS: 200.0000 ug | ORAL_TABLET | Freq: Every day | ORAL | 0 refills | Status: DC

## 2023-03-09 MED ORDER — NALTREXONE HCL 50 MG PO TABS: 25.0000 mg | ORAL_TABLET | Freq: Every evening | ORAL | 0 refills | Status: DC

## 2023-03-09 NOTE — Progress Notes (Unsigned)
Chief Complaint:   OBESITY Isabella Larson is here to discuss her progress with her obesity treatment plan along with follow-up of her obesity related diagnoses. Isabella Larson is on the Category 4 Plan and states she is following her eating plan approximately 50% of the time. Isabella Larson states she is doing 0 minutes 0 times per week.  Today's visit was #: 61 Starting weight: 305 lbs Starting date: 04/18/2019 Today's weight: 285 lbs Today's date: 03/09/2023 Total lbs lost to date: 20 Total lbs lost since last in-office visit: 0  Interim History: Patient is out of school for the summer now and has noticed that her routine has really changed and she struggles with consistency now that she is off for the summer. Has noticed some taste changes recently where things she has eaten before don't taste the same.  She has been taking the naltrexone in the am. She is leaving today to go to [facility] for a few days and then the week after 4th of July she is going to [facility] with 14 other family members.  She has been doing things with the kids and is planning on after the trip to [facility] getting on a more consistent routine.   Subjective:   1. Vitamin D deficiency Patient's last Vitamin D level was at goal at her last blood draw. She notes fatigue. Last labs was in December 2023.  2. Prediabetes Patient's last A1c was better at 5.8. She is on metformin with no GI side effects.   3. Elevated TSH Patient is on levothyroxine for hypothyroidism. Last TSH was >10.  4. 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   5. Polyphagia Patient on Naltrexone 25 mg daily. No GI side effects were noted.   Assessment/Plan:   1. Vitamin D deficiency We will check labs today, and hold refill until after lab results.   - VITAMIN D 25 Hydroxy (Vit-D Deficiency, Fractures)  2. Prediabetes We will check labs today, and we will follow-up at her next  appointment.   - Comprehensive metabolic panel - Insulin, random - Hemoglobin A1c  3. Elevated TSH We will check labs today, and we will refill levothyroxine 200 mcg for 1 month.   - TSH - levothyroxine (SYNTHROID) 200 MCG tablet; Take 1 tablet (200 mcg total) by mouth daily.  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  - CBC - Anemia panel  5. Polyphagia Patient will continue naltrexone 25 mg once daily, and we will refill for 1 month.   - naltrexone (DEPADE) 50 MG tablet; Take 0.5 tablets (25 mg total) by mouth at bedtime.  Dispense: 30 tablet; Refill: 0  6. BMI 40.0-44.9, adult (HCC)  7. Obesity with starting BMI of 46.3 Isabella Larson is currently in the action stage of change. As such, her goal is to continue with weight loss efforts. She has agreed to the Category 4 Plan.   Exercise goals: All adults should avoid inactivity. Some physical activity is better than none, and adults who participate in any amount of physical activity gain some health benefits.  Behavioral modification strategies: increasing lean protein intake, meal planning and cooking strategies, keeping healthy foods in the home, and planning for success.  Isabella Larson has agreed to follow-up with our clinic in 3 to 4 weeks. She was informed of the importance of frequent follow-up visits to  maximize her success with intensive lifestyle modifications for her multiple health conditions.   Isabella Larson was informed we would discuss her lab results at her next visit unless there is a critical issue that needs to be addressed sooner. Isabella Larson agreed to keep her next visit at the agreed upon time to discuss these results.  Objective:   Blood pressure 121/80, pulse 73, temperature 98 F (36.7 C), height 5\' 8"  (1.727 m), weight 285 lb (129.3 kg), SpO2 100 %. Body mass index is 43.33 kg/m.  General: Cooperative, alert,  well developed, in no acute distress. HEENT: Conjunctivae and lids unremarkable. Cardiovascular: Regular rhythm.  Lungs: Normal work of breathing. Neurologic: No focal deficits.   Lab Results  Component Value Date   CREATININE 0.54 (L) 03/09/2023   BUN 12 03/09/2023   NA 140 03/09/2023   K 4.5 03/09/2023   CL 106 03/09/2023   CO2 22 03/09/2023   Lab Results  Component Value Date   ALT 18 03/09/2023   AST 15 03/09/2023   ALKPHOS 98 03/09/2023   BILITOT 0.2 03/09/2023   Lab Results  Component Value Date   HGBA1C 6.1 (H) 03/09/2023   HGBA1C 5.8 (H) 09/11/2022   HGBA1C 6.4 (H) 02/10/2022   HGBA1C 6.0 (H) 07/22/2021   HGBA1C 6.3 (H) 10/30/2020   Lab Results  Component Value Date   INSULIN 9.1 03/09/2023   INSULIN 11.3 09/11/2022   INSULIN 14.2 02/10/2022   INSULIN 8.5 07/22/2021   INSULIN 13.1 10/30/2020   Lab Results  Component Value Date   TSH 2.800 03/09/2023   Lab Results  Component Value Date   CHOL 169 09/11/2022   HDL 66 09/11/2022   LDLCALC 91 09/11/2022   TRIG 62 09/11/2022   Lab Results  Component Value Date   VD25OH 37.0 03/09/2023   VD25OH 61.1 09/11/2022   VD25OH 44.8 04/14/2022   Lab Results  Component Value Date   WBC 6.7 03/09/2023   HGB 11.4 03/09/2023   HCT 36.3 03/09/2023   MCV 87 03/09/2023   PLT 236 03/09/2023   Lab Results  Component Value Date   IRON 45 03/09/2023   TIBC 371 03/09/2023   FERRITIN 14 (L) 03/09/2023   Attestation Statements:   Reviewed by clinician on day of visit: allergies, medications, problem list, medical history, surgical history, family history, social history, and previous encounter notes.   I, Burt Knack, am acting as transcriptionist for Reuben Likes, MD. I have reviewed the above documentation for accuracy and completeness, and I agree with the above. - Reuben Likes, MD

## 2023-03-10 LAB — COMPREHENSIVE METABOLIC PANEL
ALT: 18 IU/L (ref 0–32)
AST: 15 IU/L (ref 0–40)
Albumin: 4 g/dL (ref 3.9–4.9)
Alkaline Phosphatase: 98 IU/L (ref 44–121)
BUN/Creatinine Ratio: 22 (ref 9–23)
BUN: 12 mg/dL (ref 6–24)
Bilirubin Total: 0.2 mg/dL (ref 0.0–1.2)
CO2: 22 mmol/L (ref 20–29)
Calcium: 8.5 mg/dL — ABNORMAL LOW (ref 8.7–10.2)
Chloride: 106 mmol/L (ref 96–106)
Creatinine, Ser: 0.54 mg/dL — ABNORMAL LOW (ref 0.57–1.00)
Globulin, Total: 2.2 g/dL (ref 1.5–4.5)
Glucose: 105 mg/dL — ABNORMAL HIGH (ref 70–99)
Potassium: 4.5 mmol/L (ref 3.5–5.2)
Sodium: 140 mmol/L (ref 134–144)
Total Protein: 6.2 g/dL (ref 6.0–8.5)
eGFR: 119 mL/min/{1.73_m2} (ref >59)

## 2023-03-10 LAB — ANEMIA PANEL
Ferritin: 14 ng/mL — ABNORMAL LOW (ref 15–150)
Folate, Hemolysate: 389 ng/mL
Folate, RBC: 1072 ng/mL (ref >498)
Hematocrit: 36.3 % (ref 34.0–46.6)
Iron Saturation: 12 % — ABNORMAL LOW (ref 15–55)
Iron: 45 ug/dL (ref 27–159)
Retic Ct Pct: 1.3 % (ref 0.6–2.6)
Total Iron Binding Capacity: 371 ug/dL (ref 250–450)
UIBC: 326 ug/dL (ref 131–425)
Vitamin B-12: 306 pg/mL (ref 232–1245)

## 2023-03-10 LAB — CBC
Hemoglobin: 11.4 g/dL (ref 11.1–15.9)
MCH: 27.3 pg (ref 26.6–33.0)
MCHC: 31.4 g/dL — ABNORMAL LOW (ref 31.5–35.7)
MCV: 87 fL (ref 79–97)
Platelets: 236 10*3/uL (ref 150–450)
RBC: 4.18 x10E6/uL (ref 3.77–5.28)
RDW: 12.6 % (ref 11.7–15.4)
WBC: 6.7 10*3/uL (ref 3.4–10.8)

## 2023-03-10 LAB — INSULIN, RANDOM: INSULIN: 9.1 u[IU]/mL (ref 2.6–24.9)

## 2023-03-10 LAB — TSH: TSH: 2.8 u[IU]/mL (ref 0.450–4.500)

## 2023-03-10 LAB — VITAMIN D 25 HYDROXY (VIT D DEFICIENCY, FRACTURES): Vit D, 25-Hydroxy: 37 ng/mL (ref 30.0–100.0)

## 2023-03-10 LAB — HEMOGLOBIN A1C
Est. average glucose Bld gHb Est-mCnc: 128 mg/dL
Hgb A1c MFr Bld: 6.1 % — ABNORMAL HIGH (ref 4.8–5.6)

## 2023-03-25 ENCOUNTER — Other Ambulatory Visit (INDEPENDENT_AMBULATORY_CARE_PROVIDER_SITE_OTHER): Payer: Self-pay | Admitting: Family Medicine

## 2023-03-25 DIAGNOSIS — K9189 Other postprocedural complications and disorders of digestive system: Secondary | ICD-10-CM

## 2023-03-26 ENCOUNTER — Other Ambulatory Visit (INDEPENDENT_AMBULATORY_CARE_PROVIDER_SITE_OTHER): Payer: Self-pay | Admitting: Family Medicine

## 2023-03-26 DIAGNOSIS — D508 Other iron deficiency anemias: Secondary | ICD-10-CM

## 2023-03-30 NOTE — Telephone Encounter (Signed)
Ok to refill? Labs drawn at last visit 03/09/23

## 2023-04-07 ENCOUNTER — Encounter (INDEPENDENT_AMBULATORY_CARE_PROVIDER_SITE_OTHER): Payer: Self-pay | Admitting: Family Medicine

## 2023-04-07 ENCOUNTER — Ambulatory Visit (INDEPENDENT_AMBULATORY_CARE_PROVIDER_SITE_OTHER): Payer: BC Managed Care – PPO | Admitting: Family Medicine

## 2023-04-07 VITALS — BP 118/76 | HR 72 | Temp 98.6°F | Ht 68.0 in | Wt 277.0 lb

## 2023-04-07 DIAGNOSIS — K9189 Other postprocedural complications and disorders of digestive system: Secondary | ICD-10-CM | POA: Diagnosis not present

## 2023-04-07 DIAGNOSIS — E038 Other specified hypothyroidism: Secondary | ICD-10-CM | POA: Diagnosis not present

## 2023-04-07 DIAGNOSIS — Z6841 Body Mass Index (BMI) 40.0 and over, adult: Secondary | ICD-10-CM

## 2023-04-07 DIAGNOSIS — E669 Obesity, unspecified: Secondary | ICD-10-CM

## 2023-04-07 DIAGNOSIS — R7303 Prediabetes: Secondary | ICD-10-CM

## 2023-04-07 DIAGNOSIS — R600 Localized edema: Secondary | ICD-10-CM

## 2023-04-07 DIAGNOSIS — R632 Polyphagia: Secondary | ICD-10-CM | POA: Diagnosis not present

## 2023-04-07 DIAGNOSIS — D508 Other iron deficiency anemias: Secondary | ICD-10-CM

## 2023-04-07 MED ORDER — FUROSEMIDE 20 MG PO TABS
20.0000 mg | ORAL_TABLET | Freq: Every day | ORAL | 0 refills | Status: DC
Start: 2023-04-07 — End: 2023-06-04

## 2023-04-07 MED ORDER — LEVOTHYROXINE SODIUM 200 MCG PO TABS
200.0000 ug | ORAL_TABLET | Freq: Every day | ORAL | 0 refills | Status: DC
Start: 2023-04-07 — End: 2023-05-05

## 2023-04-07 MED ORDER — NALTREXONE HCL 50 MG PO TABS
25.0000 mg | ORAL_TABLET | Freq: Every evening | ORAL | 0 refills | Status: DC
Start: 2023-04-07 — End: 2023-06-04

## 2023-04-07 MED ORDER — FUSION PLUS PO CAPS
1.0000 | ORAL_CAPSULE | Freq: Every day | ORAL | 0 refills | Status: DC
Start: 2023-04-07 — End: 2023-05-05

## 2023-04-07 NOTE — Progress Notes (Signed)
Chief Complaint:   OBESITY Isabella Larson is here to discuss her progress with her obesity treatment plan along with follow-up of her obesity related diagnoses. Isabella Larson is on the Category 4 Plan and states she is following her eating plan approximately 50% of the time. Isabella Larson states she is swimming and walking for 20-30 minutes 3 times per week.  Today's visit was #: 62 Starting weight: 305 lbs Starting date: 04/18/2019 Today's weight: 277 lbs Today's date: 04/07/2023 Total lbs lost to date: 28 Total lbs lost since last in-office visit: 8  Interim History: Since last appointment she has done quite a bit of traveling.  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  In couple weeks she is going to [place] in [place] with her friend. Vacation wise she was not always eating all her meals and was often combining breakfast and lunch.  She would often opt for [employer] for a salad for dinner while on the movie set.  Realizes she will probably do quick service meals while at [facility].   Subjective:   1. Other specified hypothyroidism Patient is on level with Peroxin daily 200 mcg.  She denies cold or hot intolerance, or palpitations.  TSH at last appointment was 2.8.  2. 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 Iron level is within normal limits, and ferritin low (stable from last draw).   3. Polyphagia Patient was previously on phentermine-topiramate, but she did not achieve 5% weight loss.  She is feeling better on naltrexone.  4. Edema of both lower extremities Patient is on Lasix 1 to 2 tablets daily.  Her symptoms are well-controlled.  5. Prediabetes Patient's last A1c was 6.1 (previously 5.8).  Patient is on metformin, and she has had more carbohydrate cravings since stopping GLP-1.  Assessment/Plan:   1. Other specified hypothyroidism Patient will continue  Synthroid 200 mcg daily, and we will refill for 90 days.  - levothyroxine (SYNTHROID) 200 MCG tablet; Take 1 tablet (200 mcg total) by mouth daily.  Dispense: 90 tablet; Refill: 0  2. 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  - Iron-FA-B Cmp-C-Biot-Probiotic (FUSION PLUS) CAPS; Take 1 capsule by mouth daily.  Dispense: 90 capsule; Refill: 0  3. Polyphagia Patient will continue naltrexone 25 mg once daily, and we will refill for 1 month.  - naltrexone (DEPADE) 50 MG tablet; Take 0.5 tablets (25 mg total) by mouth at bedtime.  Dispense: 45 tablet; Refill: 0  4. Edema of both lower extremities Patient will continue Lasix 1 to 2 tablets daily, and we will refill for 1 month.  - furosemide (LASIX) 20 MG tablet; Take 1-2 tablets (20-40 mg total) by mouth daily.  Dispense: 45 tablet; Refill: 0  5. Prediabetes Patient will continue metformin with no change in treatment.  We will repeat labs in November.  6. BMI 40.0-44.9, adult (HCC)  7. Obesity with starting BMI of 46.3 Isabella Larson is currently in the action stage of change. As such, her goal is to continue with weight loss efforts. She has agreed to the Category 4 Plan.   Exercise goals: All adults should avoid inactivity. Some physical activity is better than none, and adults who participate in any amount of physical activity gain some health benefits.  Behavioral modification strategies: increasing lean protein intake, meal planning and  cooking strategies, keeping healthy foods in the home, and planning for success.  Isabella Larson has agreed to follow-up with our clinic in 4 weeks. She was informed of the importance of frequent follow-up visits to maximize her success with intensive lifestyle modifications for her multiple health conditions.   Objective:   Blood pressure 118/76, pulse 72, temperature 98.6 F (37 C), height 5\' 8"  (1.727 m), weight 277 lb (125.6  kg), SpO2 99%. Body mass index is 42.12 kg/m.  General: Cooperative, alert, well developed, in no acute distress. HEENT: Conjunctivae and lids unremarkable. Cardiovascular: Regular rhythm.  Lungs: Normal work of breathing. Neurologic: No focal deficits.   Lab Results  Component Value Date   CREATININE 0.54 (L) 03/09/2023   BUN 12 03/09/2023   NA 140 03/09/2023   K 4.5 03/09/2023   CL 106 03/09/2023   CO2 22 03/09/2023   Lab Results  Component Value Date   ALT 18 03/09/2023   AST 15 03/09/2023   ALKPHOS 98 03/09/2023   BILITOT 0.2 03/09/2023   Lab Results  Component Value Date   HGBA1C 6.1 (H) 03/09/2023   HGBA1C 5.8 (H) 09/11/2022   HGBA1C 6.4 (H) 02/10/2022   HGBA1C 6.0 (H) 07/22/2021   HGBA1C 6.3 (H) 10/30/2020   Lab Results  Component Value Date   INSULIN 9.1 03/09/2023   INSULIN 11.3 09/11/2022   INSULIN 14.2 02/10/2022   INSULIN 8.5 07/22/2021   INSULIN 13.1 10/30/2020   Lab Results  Component Value Date   TSH 2.800 03/09/2023   Lab Results  Component Value Date   CHOL 169 09/11/2022   HDL 66 09/11/2022   LDLCALC 91 09/11/2022   TRIG 62 09/11/2022   Lab Results  Component Value Date   VD25OH 37.0 03/09/2023   VD25OH 61.1 09/11/2022   VD25OH 44.8 04/14/2022   Lab Results  Component Value Date   WBC 6.7 03/09/2023   HGB 11.4 03/09/2023   HCT 36.3 03/09/2023   MCV 87 03/09/2023   PLT 236 03/09/2023   Lab Results  Component Value Date   IRON 45 03/09/2023   TIBC 371 03/09/2023   FERRITIN 14 (L) 03/09/2023   Attestation Statements:   Reviewed by clinician on day of visit: allergies, medications, problem list, medical history, surgical history, family history, social history, and previous encounter notes.   I, Burt Knack, am acting as transcriptionist for Reuben Likes, MD.  I have reviewed the above documentation for accuracy and completeness, and I agree with the above. - Reuben Likes, MD

## 2023-04-21 ENCOUNTER — Other Ambulatory Visit (INDEPENDENT_AMBULATORY_CARE_PROVIDER_SITE_OTHER): Payer: Self-pay | Admitting: Physician Assistant

## 2023-04-21 DIAGNOSIS — R7303 Prediabetes: Secondary | ICD-10-CM

## 2023-05-04 ENCOUNTER — Other Ambulatory Visit (INDEPENDENT_AMBULATORY_CARE_PROVIDER_SITE_OTHER): Payer: Self-pay | Admitting: Family Medicine

## 2023-05-04 DIAGNOSIS — R600 Localized edema: Secondary | ICD-10-CM

## 2023-05-05 ENCOUNTER — Ambulatory Visit (INDEPENDENT_AMBULATORY_CARE_PROVIDER_SITE_OTHER): Payer: BC Managed Care – PPO | Admitting: Family Medicine

## 2023-05-05 ENCOUNTER — Encounter (INDEPENDENT_AMBULATORY_CARE_PROVIDER_SITE_OTHER): Payer: Self-pay | Admitting: Family Medicine

## 2023-05-05 VITALS — BP 121/76 | HR 72 | Temp 98.4°F | Ht 68.0 in | Wt 282.0 lb

## 2023-05-05 DIAGNOSIS — E559 Vitamin D deficiency, unspecified: Secondary | ICD-10-CM

## 2023-05-05 DIAGNOSIS — D508 Other iron deficiency anemias: Secondary | ICD-10-CM

## 2023-05-05 DIAGNOSIS — Z6841 Body Mass Index (BMI) 40.0 and over, adult: Secondary | ICD-10-CM

## 2023-05-05 DIAGNOSIS — K9189 Other postprocedural complications and disorders of digestive system: Secondary | ICD-10-CM | POA: Diagnosis not present

## 2023-05-05 DIAGNOSIS — R7303 Prediabetes: Secondary | ICD-10-CM

## 2023-05-05 DIAGNOSIS — E038 Other specified hypothyroidism: Secondary | ICD-10-CM | POA: Diagnosis not present

## 2023-05-05 DIAGNOSIS — E669 Obesity, unspecified: Secondary | ICD-10-CM

## 2023-05-05 MED ORDER — FUSION PLUS PO CAPS
1.0000 | ORAL_CAPSULE | Freq: Every day | ORAL | 0 refills | Status: DC
Start: 2023-05-05 — End: 2023-08-19

## 2023-05-05 MED ORDER — VITAMIN D (ERGOCALCIFEROL) 1.25 MG (50000 UNIT) PO CAPS
50000.0000 [IU] | ORAL_CAPSULE | ORAL | 0 refills | Status: DC
Start: 2023-05-05 — End: 2023-06-04

## 2023-05-05 MED ORDER — LEVOTHYROXINE SODIUM 200 MCG PO TABS
200.0000 ug | ORAL_TABLET | Freq: Every day | ORAL | 0 refills | Status: DC
Start: 2023-05-05 — End: 2023-10-01

## 2023-05-05 MED ORDER — METFORMIN HCL 500 MG PO TABS
500.0000 mg | ORAL_TABLET | Freq: Every day | ORAL | 0 refills | Status: DC
Start: 2023-05-05 — End: 2023-10-01

## 2023-05-05 NOTE — Progress Notes (Signed)
Chief Complaint:   OBESITY Isabella Larson is here to discuss her progress with her obesity treatment plan along with follow-up of her obesity related diagnoses. Isabella Larson is on the Category 4 Plan and states she is following her eating plan approximately 50% of the time. Isabella Larson states she is wall exercises for 20-30 minutes 2-3 times per week.  Today's visit was #: 63 Starting weight: 305 lbs Starting date: 04/18/2019 Today's weight: 282 lbs Today's date: 05/05/2023 Total lbs lost to date: 23 Total lbs lost since last in-office visit: 0  Interim History: Since last appointment she went to [place] in [place] and had a little vacation for half a week and enjoyed time with her mother and niece.  Weather was hot but there was a breeze so patient was able to really enjoy herself. Since last appointment she has done really well following plan while she was home but did struggle to stay on plan while traveling.  She has some food prepped that she is putting together for meals pending her desire.  She is excited to get back into routine with school returning. She has a good amount of protein frozen ready to use. She was getting more swimming in but is having more pain in her knees recently that she isn't sure if its related.  Subjective:   1. 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  No side effects were noted of constipation.  Her last labs were still showing low ferritin.  2. Vitamin D deficiency Patient is on prescription vitamin D.  She denies nausea, vomiting, or muscle weakness but notes fatigue.  3. Other specified hypothyroidism Patient is on Synthroid 200 mcg daily.  She denies cold or hot intolerance, or palpitations.  4. Prediabetes Patient is on metformin daily with no GI side effects noted.  Assessment/Plan:   1. 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  - Iron-FA-B  Cmp-C-Biot-Probiotic (FUSION PLUS) CAPS; Take 1 capsule by mouth daily.  Dispense: 90 capsule; Refill: 0  2. Vitamin D deficiency Patient will continue prescription vitamin D once weekly, and we will refill for 1 month.  - Vitamin D, Ergocalciferol, (DRISDOL) 1.25 MG (50000 UNIT) CAPS capsule; Take 1 capsule (50,000 Units total) by mouth every 7 (seven) days.  Dispense: 4 capsule; Refill: 0  3. Other specified hypothyroidism Patient will continue Synthroid 200 mcg daily, and we will refill for 90 days.  - levothyroxine (SYNTHROID) 200 MCG tablet; Take 1 tablet (200 mcg total) by mouth daily.  Dispense: 90 tablet; Refill: 0  4. Prediabetes Patient will continue metformin 500 mg once daily, and we will refill for 90 days.  - metFORMIN (GLUCOPHAGE) 500 MG tablet; Take 1 tablet (500 mg total) by mouth daily with breakfast.  Dispense: 90 tablet; Refill: 0  5. BMI 40.0-44.9, adult (HCC)  6. Obesity with starting BMI of 46.3 Isabella Larson is currently in the action stage of change. As such, her goal is to continue with weight loss efforts. She has agreed to the Category 4 Plan.   Exercise goals: All adults should avoid inactivity. Some physical activity is better than none, and adults who participate in any amount of physical activity gain some health benefits.  Behavioral modification strategies: increasing lean protein intake, meal planning and cooking strategies, keeping healthy foods in the home, and planning for success.  Isabella Larson has agreed to follow-up with our clinic in 4 weeks. She was informed  of the importance of frequent follow-up visits to maximize her success with intensive lifestyle modifications for her multiple health conditions.   Objective:   Blood pressure 121/76, pulse 72, temperature 98.4 F (36.9 C), height 5\' 8"  (1.727 m), weight 282 lb (127.9 kg), SpO2 100%. Body mass index is 42.88 kg/m.  General: Cooperative, alert, well developed, in no acute distress. HEENT:  Conjunctivae and lids unremarkable. Cardiovascular: Regular rhythm.  Lungs: Normal work of breathing. Neurologic: No focal deficits.   Lab Results  Component Value Date   CREATININE 0.54 (L) 03/09/2023   BUN 12 03/09/2023   NA 140 03/09/2023   K 4.5 03/09/2023   CL 106 03/09/2023   CO2 22 03/09/2023   Lab Results  Component Value Date   ALT 18 03/09/2023   AST 15 03/09/2023   ALKPHOS 98 03/09/2023   BILITOT 0.2 03/09/2023   Lab Results  Component Value Date   HGBA1C 6.1 (H) 03/09/2023   HGBA1C 5.8 (H) 09/11/2022   HGBA1C 6.4 (H) 02/10/2022   HGBA1C 6.0 (H) 07/22/2021   HGBA1C 6.3 (H) 10/30/2020   Lab Results  Component Value Date   INSULIN 9.1 03/09/2023   INSULIN 11.3 09/11/2022   INSULIN 14.2 02/10/2022   INSULIN 8.5 07/22/2021   INSULIN 13.1 10/30/2020   Lab Results  Component Value Date   TSH 2.800 03/09/2023   Lab Results  Component Value Date   CHOL 169 09/11/2022   HDL 66 09/11/2022   LDLCALC 91 09/11/2022   TRIG 62 09/11/2022   Lab Results  Component Value Date   VD25OH 37.0 03/09/2023   VD25OH 61.1 09/11/2022   VD25OH 44.8 04/14/2022   Lab Results  Component Value Date   WBC 6.7 03/09/2023   HGB 11.4 03/09/2023   HCT 36.3 03/09/2023   MCV 87 03/09/2023   PLT 236 03/09/2023   Lab Results  Component Value Date   IRON 45 03/09/2023   TIBC 371 03/09/2023   FERRITIN 14 (L) 03/09/2023   Attestation Statements:   Reviewed by clinician on day of visit: allergies, medications, problem list, medical history, surgical history, family history, social history, and previous encounter notes.   I, Burt Knack, am acting as transcriptionist for Reuben Likes, MD.  I have reviewed the above documentation for accuracy and completeness, and I agree with the above. - Reuben Likes, MD

## 2023-06-04 ENCOUNTER — Encounter (INDEPENDENT_AMBULATORY_CARE_PROVIDER_SITE_OTHER): Payer: Self-pay | Admitting: Family Medicine

## 2023-06-04 ENCOUNTER — Ambulatory Visit (INDEPENDENT_AMBULATORY_CARE_PROVIDER_SITE_OTHER): Payer: BC Managed Care – PPO | Admitting: Family Medicine

## 2023-06-04 VITALS — BP 104/73 | HR 72 | Temp 98.2°F | Ht 68.0 in | Wt 281.0 lb

## 2023-06-04 DIAGNOSIS — R632 Polyphagia: Secondary | ICD-10-CM | POA: Diagnosis not present

## 2023-06-04 DIAGNOSIS — E559 Vitamin D deficiency, unspecified: Secondary | ICD-10-CM

## 2023-06-04 DIAGNOSIS — Z6841 Body Mass Index (BMI) 40.0 and over, adult: Secondary | ICD-10-CM

## 2023-06-04 DIAGNOSIS — R600 Localized edema: Secondary | ICD-10-CM | POA: Diagnosis not present

## 2023-06-04 DIAGNOSIS — E669 Obesity, unspecified: Secondary | ICD-10-CM

## 2023-06-04 MED ORDER — VITAMIN D (ERGOCALCIFEROL) 1.25 MG (50000 UNIT) PO CAPS
50000.0000 [IU] | ORAL_CAPSULE | ORAL | 0 refills | Status: DC
Start: 2023-06-04 — End: 2023-07-28

## 2023-06-04 MED ORDER — FUROSEMIDE 20 MG PO TABS
20.0000 mg | ORAL_TABLET | Freq: Every day | ORAL | 0 refills | Status: DC
Start: 2023-06-04 — End: 2023-07-28

## 2023-06-04 MED ORDER — NALTREXONE HCL 50 MG PO TABS
25.0000 mg | ORAL_TABLET | Freq: Every evening | ORAL | 0 refills | Status: DC
Start: 2023-06-04 — End: 2023-07-01

## 2023-06-10 NOTE — Progress Notes (Unsigned)
Chief Complaint:   OBESITY Margueritte is here to discuss her progress with her obesity treatment plan along with follow-up of her obesity related diagnoses. Jazmarie is on the Category 4 Plan and states she is following her eating plan approximately 40% of the time. Sacha states she is doing 0 minutes 0 times per week.  Today's visit was #: 64 Starting weight: 305 lbs Starting date: 04/18/2019 Today's weight: 281 lbs Today's date: 06/04/2023 Total lbs lost to date: 24 Total lbs lost since last in-office visit: 1  Interim History: Patient had been doing better with getting back on track after starting back to school.  She has been sick recently and she has not been able to follow her plan as closely.  Subjective:   1. Edema of both lower extremities Patient is on Lasix most days.  2. Polyphagia Patient is stable on naltrexone, and she is working on following her diet.  No side effects were noted.  3. Vitamin D deficiency Patient is on vitamin D prescription, and her level is not yet at goal.  Assessment/Plan:   1. Edema of both lower extremities We will refill Lasix 20 mg daily for 1 month.  Patient is to start wearing compression socks when possible.  - furosemide (LASIX) 20 MG tablet; Take 1-2 tablets (20-40 mg total) by mouth daily.  Dispense: 45 tablet; Refill: 0  2. Polyphagia Patient will continue naltrexone 50 mg 1/2 tablet nightly, and we will refill for 1 month.  - naltrexone (DEPADE) 50 MG tablet; Take 0.5 tablets (25 mg total) by mouth at bedtime.  Dispense: 45 tablet; Refill: 0  3. Vitamin D deficiency Patient will continue prescription vitamin D once weekly, and we will refill for 1 month.  We will continue to follow.  - Vitamin D, Ergocalciferol, (DRISDOL) 1.25 MG (50000 UNIT) CAPS capsule; Take 1 capsule (50,000 Units total) by mouth every 7 (seven) days.  Dispense: 4 capsule; Refill: 0  4. BMI 40.0-44.9, adult (HCC)  5. Obesity with starting BMI of  46.3 Brydie is currently in the action stage of change. As such, her goal is to continue with weight loss efforts. She has agreed to the Category 4 Plan.   Behavioral modification strategies: no skipping meals and meal planning and cooking strategies.  Maybree has agreed to follow-up with our clinic in 4 weeks with Dr. [name] Radar. She was informed of the importance of frequent follow-up visits to maximize her success with intensive lifestyle modifications for her multiple health conditions.   Objective:   Blood pressure 104/73, pulse 72, temperature 98.2 F (36.8 C), height 5\' 8"  (1.727 m), weight 281 lb (127.5 kg), SpO2 98%. Body mass index is 42.73 kg/m.  Lab Results  Component Value Date   CREATININE 0.54 (L) 03/09/2023   BUN 12 03/09/2023   NA 140 03/09/2023   K 4.5 03/09/2023   CL 106 03/09/2023   CO2 22 03/09/2023   Lab Results  Component Value Date   ALT 18 03/09/2023   AST 15 03/09/2023   ALKPHOS 98 03/09/2023   BILITOT 0.2 03/09/2023   Lab Results  Component Value Date   HGBA1C 6.1 (H) 03/09/2023   HGBA1C 5.8 (H) 09/11/2022   HGBA1C 6.4 (H) 02/10/2022   HGBA1C 6.0 (H) 07/22/2021   HGBA1C 6.3 (H) 10/30/2020   Lab Results  Component Value Date   INSULIN 9.1 03/09/2023   INSULIN 11.3 09/11/2022   INSULIN 14.2 02/10/2022   INSULIN 8.5 07/22/2021   INSULIN 13.1  10/30/2020   Lab Results  Component Value Date   TSH 2.800 03/09/2023   Lab Results  Component Value Date   CHOL 169 09/11/2022   HDL 66 09/11/2022   LDLCALC 91 09/11/2022   TRIG 62 09/11/2022   Lab Results  Component Value Date   VD25OH 37.0 03/09/2023   VD25OH 61.1 09/11/2022   VD25OH 44.8 04/14/2022   Lab Results  Component Value Date   WBC 6.7 03/09/2023   HGB 11.4 03/09/2023   HCT 36.3 03/09/2023   MCV 87 03/09/2023   PLT 236 03/09/2023   Lab Results  Component Value Date   IRON 45 03/09/2023   TIBC 371 03/09/2023   FERRITIN 14 (L) 03/09/2023   Attestation Statements:    Reviewed by clinician on day of visit: allergies, medications, problem list, medical history, surgical history, family history, social history, and previous encounter notes.   I, Burt Knack, am acting as transcriptionist for Quillian Quince, MD.  I have reviewed the above documentation for accuracy and completeness, and I agree with the above. -  Quillian Quince, MD

## 2023-07-01 ENCOUNTER — Ambulatory Visit (INDEPENDENT_AMBULATORY_CARE_PROVIDER_SITE_OTHER): Payer: BC Managed Care – PPO | Admitting: Family Medicine

## 2023-07-01 ENCOUNTER — Encounter (INDEPENDENT_AMBULATORY_CARE_PROVIDER_SITE_OTHER): Payer: Self-pay | Admitting: Family Medicine

## 2023-07-01 VITALS — BP 92/65 | HR 63 | Temp 98.0°F | Ht 68.0 in | Wt 279.0 lb

## 2023-07-01 DIAGNOSIS — R632 Polyphagia: Secondary | ICD-10-CM | POA: Diagnosis not present

## 2023-07-01 DIAGNOSIS — Z6841 Body Mass Index (BMI) 40.0 and over, adult: Secondary | ICD-10-CM

## 2023-07-01 DIAGNOSIS — E669 Obesity, unspecified: Secondary | ICD-10-CM | POA: Diagnosis not present

## 2023-07-01 DIAGNOSIS — E559 Vitamin D deficiency, unspecified: Secondary | ICD-10-CM

## 2023-07-01 DIAGNOSIS — R7303 Prediabetes: Secondary | ICD-10-CM | POA: Diagnosis not present

## 2023-07-01 MED ORDER — NALTREXONE HCL 50 MG PO TABS
25.0000 mg | ORAL_TABLET | Freq: Every evening | ORAL | 0 refills | Status: DC
Start: 2023-07-01 — End: 2023-07-28

## 2023-07-01 NOTE — Progress Notes (Signed)
Chief Complaint:   OBESITY Isabella Larson is here to discuss her progress with her obesity treatment plan along with follow-up of her obesity related diagnoses. Isabella Larson is on the Category 4 Plan and states she is following her eating plan approximately 50% of the time. Isabella Larson states she is doing 0 minutes 0 times per week.  Today's visit was #: 65 Starting weight: 305 lbs Starting date: 04/18/2019 Today's weight: 279 lbs Today's date: 07/01/2023 Total lbs lost to date: 26 Total lbs lost since last in-office visit: 2  Interim History: Since the middle of August she has been focused on work and had a trip to [place].  Foodwise she is still taking her lunch- took salad and taco meat with cheese and lettuce and chips on top.  She had sausage with no issues.  She has been trying to stay consistent but has had a few days she came home from work and didn't feel like eating much.  She is looking for alternative salty crunchy snacks. Next few weeks she is going to the [facility] in November.  Subjective:   1. Vitamin D deficiency Patient is on prescription vitamin D.  She denies nausea, vomiting, or muscle weakness but notes fatigue.  Her last vitamin D level was of 37.0.  2. Prediabetes Patient is on metformin.  Her last A1c was 6.1 and insulin 9.1.  3. Polyphagia Patient is on naltrexone 25 mg daily with no sign effects noted.  Assessment/Plan:   1. Vitamin D deficiency Patient will continue vitamin D 50,000 IU weekly.  2. Prediabetes Patient will continue metformin with no change in dose.  We will repeat labs at patient's next appointment.  3. Polyphagia We will refill naltrexone 25 mg once daily for 1 month.  - naltrexone (DEPADE) 50 MG tablet; Take 0.5 tablets (25 mg total) by mouth at bedtime.  Dispense: 45 tablet; Refill: 0  4. BMI 40.0-44.9, adult (HCC)  5. Obesity with starting BMI of 46.3 Isabella Larson is currently in the action stage of change. As such, her goal is to continue with  weight loss efforts. She has agreed to the Category 4 Plan.   Exercise goals: No exercise has been prescribed at this time.  Behavioral modification strategies: increasing lean protein intake, meal planning and cooking strategies, keeping healthy foods in the home, and planning for success.  Isabella Larson has agreed to follow-up with our clinic in 4 weeks. She was informed of the importance of frequent follow-up visits to maximize her success with intensive lifestyle modifications for her multiple health conditions.   Objective:   Blood pressure 92/65, pulse 63, temperature 98 F (36.7 C), height 5\' 8"  (1.727 m), weight 279 lb (126.6 kg), SpO2 100%. Body mass index is 42.42 kg/m.  General: Cooperative, alert, well developed, in no acute distress. HEENT: Conjunctivae and lids unremarkable. Cardiovascular: Regular rhythm.  Lungs: Normal work of breathing. Neurologic: No focal deficits.   Lab Results  Component Value Date   CREATININE 0.54 (L) 03/09/2023   BUN 12 03/09/2023   NA 140 03/09/2023   K 4.5 03/09/2023   CL 106 03/09/2023   CO2 22 03/09/2023   Lab Results  Component Value Date   ALT 18 03/09/2023   AST 15 03/09/2023   ALKPHOS 98 03/09/2023   BILITOT 0.2 03/09/2023   Lab Results  Component Value Date   HGBA1C 6.1 (H) 03/09/2023   HGBA1C 5.8 (H) 09/11/2022   HGBA1C 6.4 (H) 02/10/2022   HGBA1C 6.0 (H) 07/22/2021  HGBA1C 6.3 (H) 10/30/2020   Lab Results  Component Value Date   INSULIN 9.1 03/09/2023   INSULIN 11.3 09/11/2022   INSULIN 14.2 02/10/2022   INSULIN 8.5 07/22/2021   INSULIN 13.1 10/30/2020   Lab Results  Component Value Date   TSH 2.800 03/09/2023   Lab Results  Component Value Date   CHOL 169 09/11/2022   HDL 66 09/11/2022   LDLCALC 91 09/11/2022   TRIG 62 09/11/2022   Lab Results  Component Value Date   VD25OH 37.0 03/09/2023   VD25OH 61.1 09/11/2022   VD25OH 44.8 04/14/2022   Lab Results  Component Value Date   WBC 6.7 03/09/2023    HGB 11.4 03/09/2023   HCT 36.3 03/09/2023   MCV 87 03/09/2023   PLT 236 03/09/2023   Lab Results  Component Value Date   IRON 45 03/09/2023   TIBC 371 03/09/2023   FERRITIN 14 (L) 03/09/2023   Attestation Statements:   Reviewed by clinician on day of visit: allergies, medications, problem list, medical history, surgical history, family history, social history, and previous encounter notes.    I, Burt Knack, am acting as transcriptionist for Reuben Likes, MD.  I have reviewed the above documentation for accuracy and completeness, and I agree with the above. - Reuben Likes, MD

## 2023-07-03 ENCOUNTER — Other Ambulatory Visit (INDEPENDENT_AMBULATORY_CARE_PROVIDER_SITE_OTHER): Payer: Self-pay | Admitting: Family Medicine

## 2023-07-03 DIAGNOSIS — E559 Vitamin D deficiency, unspecified: Secondary | ICD-10-CM

## 2023-07-13 ENCOUNTER — Other Ambulatory Visit (INDEPENDENT_AMBULATORY_CARE_PROVIDER_SITE_OTHER): Payer: BC Managed Care – PPO

## 2023-07-13 ENCOUNTER — Ambulatory Visit (INDEPENDENT_AMBULATORY_CARE_PROVIDER_SITE_OTHER): Payer: BC Managed Care – PPO | Admitting: Physician Assistant

## 2023-07-13 DIAGNOSIS — M25512 Pain in left shoulder: Secondary | ICD-10-CM

## 2023-07-13 NOTE — Addendum Note (Signed)
Addended by: Mardene Celeste B on: 07/13/2023 04:22 PM   Modules accepted: Orders

## 2023-07-13 NOTE — Progress Notes (Signed)
Office Visit Note   Patient: Isabella Larson           Date of Birth: 1981/01/13           MRN: [id number] Visit Date: 07/13/2023              Requested by: Noberto Retort, MD [phone] [address] [place],  [place] [address] PCP: Noberto Retort, MD   Assessment & Plan: Visit Diagnoses:  1. Acute pain of left shoulder     Plan: We will send her for formal physical therapy.  Shoulder to work on range of motion, strengthening, home exercise program and modalities.  Will see her back in 6 weeks see how she is doing overall.  Pain persist or becomes worse may consider cortisone injection subacromial versus MRI.  Questions were encouraged and answered at length  Follow-Up Instructions: Return in about 6 weeks (around 08/24/2023).   Orders:  Orders Placed This Encounter  Procedures   XR Shoulder Left   No orders of the defined types were placed in this encounter.     Procedures: No procedures performed   Clinical Data: No additional findings.   Subjective: Chief Complaint  Patient presents with   Left Shoulder - Pain    HPI [name] 42 year old female who we have not seen in over 5 years.  Comes in today with left shoulder pain.  Shoulder pains been ongoing for the last month.  Pain comes and goes.  She describes it as a sharp pain that sometimes goes down into the mid humerus.  Denies any numbness tingling.  Denies any neck pain.  Pain is worse with reaching behind her and reaching overhead.  She has tried Advil with some relief.  Pain does not awaken her. Nondiabetic.  Review of Systems See HPI otherwise negative or noncontributory  Objective: Vital Signs: There were no vitals taken for this visit.  Physical Exam Constitutional:      Appearance: She is not ill-appearing or diaphoretic.  Pulmonary:     Effort: Pulmonary effort is normal.  Neurological:     Mental Status: She is alert and oriented to person, place, and time.  Psychiatric:         Mood and Affect: Mood normal.     Ortho Exam Bilateral shoulders: Full overhead activity actively.  Positive impingement testing on the left negative on the right.  5 out of 5 strength external/internal rotation against resistance.  Empty can test is negative bilaterally.  Liftoff test is negative bilaterally but painful on the left.  Specialty Comments:  No specialty comments available.  Imaging: XR Shoulder Left  Result Date: 07/13/2023 Left shoulder 3 views: Shoulder is well located.  No acute fractures.  Glenohumeral joints well-maintained.  No acute findings.    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.9 in adult [facility]) 05/04/2019   Prediabetes 04/19/2019   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 09/13/2013  B12 deficiency 09/13/2013   H/O gastric bypass 09/13/2013   Past Medical History:  Diagnosis Date   Anemia    Back pain    Edema of both feet    Hypothyroidism    Vitamin B 12 deficiency    Vitamin D deficiency     Family History  Problem Relation Age of Onset   High Cholesterol Mother    Cancer Mother    Sleep apnea Mother    Obesity Mother    Obesity Father    Sleep apnea Father    High Cholesterol Father     Past Surgical History:  Procedure Laterality Date   mini gastric bypass     Social History   Occupational History   Occupation: Therapist, sports  Tobacco Use   Smoking status: Never   Smokeless tobacco: Never   Substance and Sexual Activity   Alcohol use: Not on file   Drug use: Not on file   Sexual activity: Not on file

## 2023-07-27 ENCOUNTER — Ambulatory Visit: Payer: BC Managed Care – PPO | Admitting: Physical Therapy

## 2023-07-28 ENCOUNTER — Encounter (INDEPENDENT_AMBULATORY_CARE_PROVIDER_SITE_OTHER): Payer: Self-pay | Admitting: Family Medicine

## 2023-07-28 ENCOUNTER — Ambulatory Visit: Payer: BC Managed Care – PPO | Admitting: Physical Therapy

## 2023-07-28 ENCOUNTER — Other Ambulatory Visit: Payer: Self-pay

## 2023-07-28 ENCOUNTER — Ambulatory Visit (INDEPENDENT_AMBULATORY_CARE_PROVIDER_SITE_OTHER): Payer: BC Managed Care – PPO | Admitting: Family Medicine

## 2023-07-28 ENCOUNTER — Encounter: Payer: Self-pay | Admitting: Physical Therapy

## 2023-07-28 VITALS — BP 123/79 | HR 83 | Temp 98.2°F | Ht 68.0 in | Wt 284.0 lb

## 2023-07-28 DIAGNOSIS — E559 Vitamin D deficiency, unspecified: Secondary | ICD-10-CM | POA: Diagnosis not present

## 2023-07-28 DIAGNOSIS — Z6841 Body Mass Index (BMI) 40.0 and over, adult: Secondary | ICD-10-CM

## 2023-07-28 DIAGNOSIS — M25512 Pain in left shoulder: Secondary | ICD-10-CM

## 2023-07-28 DIAGNOSIS — Z9189 Other specified personal risk factors, not elsewhere classified: Secondary | ICD-10-CM

## 2023-07-28 DIAGNOSIS — R632 Polyphagia: Secondary | ICD-10-CM | POA: Diagnosis not present

## 2023-07-28 DIAGNOSIS — M6281 Muscle weakness (generalized): Secondary | ICD-10-CM

## 2023-07-28 DIAGNOSIS — R600 Localized edema: Secondary | ICD-10-CM | POA: Diagnosis not present

## 2023-07-28 DIAGNOSIS — E669 Obesity, unspecified: Secondary | ICD-10-CM | POA: Diagnosis not present

## 2023-07-28 MED ORDER — VITAMIN D (ERGOCALCIFEROL) 1.25 MG (50000 UNIT) PO CAPS
50000.0000 [IU] | ORAL_CAPSULE | ORAL | 0 refills | Status: DC
Start: 2023-07-28 — End: 2023-10-01

## 2023-07-28 MED ORDER — NALTREXONE HCL 50 MG PO TABS
25.0000 mg | ORAL_TABLET | Freq: Every evening | ORAL | 0 refills | Status: DC
Start: 2023-07-28 — End: 2023-12-30

## 2023-07-28 MED ORDER — FUROSEMIDE 20 MG PO TABS: 20.0000 mg | ORAL_TABLET | Freq: Every day | ORAL | 0 refills | Status: DC

## 2023-07-28 NOTE — Therapy (Signed)
OUTPATIENT PHYSICAL THERAPY SHOULDER EVALUATION   Patient Name: Isabella Larson MRN: [id number] DOB:Jul 04, 1981, 42 y.o., female Today's Date: 07/28/2023  END OF SESSION:  PT End of Session - 07/28/23 0926     Visit Number 1    Number of Visits 4    Date for PT Re-Evaluation 09/08/23    PT Start Time 0845    PT Stop Time 0925    PT Time Calculation (min) 40 min    Activity Tolerance Patient tolerated treatment well    Behavior During Therapy [facility] for tasks assessed/performed             Past Medical History:  Diagnosis Date   Anemia    Back pain    Edema of both feet    Hypothyroidism    Vitamin B 12 deficiency    Vitamin D deficiency    Past Surgical History:  Procedure Laterality Date   mini gastric bypass     Patient Active Problem List   Diagnosis Date Noted   Polyphagia 12/25/2022   Obesity with starting BMI of 46.3 12/25/2022   BMI 40.0-44.9, adult Current BMI 42.1 12/25/2022   Bronchitis 06/26/2022   Edema of both lower extremities 06/02/2022   Absolute anemia 02/19/2022   Other specified hypothyroidism- post-surgical  02/10/2022   Vitamin D deficiency 02/10/2022   Class 3 severe obesity with serious comorbidity and body mass index (BMI) 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.9 in adult (HCC) 12/01/2019   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 (BMI) 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.9 in adult (HCC) 05/04/2019   Prediabetes 04/19/2019   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 09/13/2013   B12 deficiency 09/13/2013   H/O gastric bypass 09/13/2013    PCP: Noberto Retort, MD   REFERRING PROVIDER: Kirtland Bouchard, PA-C   REFERRING DIAG: [phone] (ICD-10-CM) - Acute pain of left shoulder  THERAPY DIAG:  Acute pain of left shoulder  Muscle weakness (generalized)  Rationale for Evaluation and Treatment: Rehabilitation  ONSET DATE: one month onset of pain  SUBJECTIVE:                                                                                                                                                                                       SUBJECTIVE STATEMENT: Shoulder pains been ongoing for the last month. Pain comes and goes, it started out shooting pains to her elbow but now is more of a constant dull pain. She describes it as a sharp pain that sometimes goes down into the mid humerus. Denies any numbness tingling. Denies any neck pain. Denies any specific MOI  Hand dominance: Right  PERTINENT HISTORY: none  PAIN:  NPRS scale:4 currently and at worse it is 8-9 /10 upon arrival Pain location:superior anterior shoulder Pain description: constant dull ache but occasional shooting pain to her elbow, tightness Aggravating factors: Pain is worse with reaching behind her and reaching overhead. Lifting, sleeping on the left shoulder Relieving factors: heat   PRECAUTIONS: None  RED FLAGS: None   WEIGHT BEARING RESTRICTIONS: No  FALLS:  Has patient fallen in last 6 months? 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  DIAGNOSTIC FINDINGS:  XR Shoulder Left   Result Date: 07/13/2023 Left shoulder 3 views: Shoulder is well located.  No acute fractures.  Glenohumeral joints well-maintained.  No acute findings.  PATIENT SURVEYS:  Eval: FOTO 59% functional intake, goal is 74%  COGNITION: Overall cognitive status: Within functional limits for tasks assessed     SENSATION: WFL    UPPER EXTREMITY ROM:    AROM/PROM Right eval Left eval  Shoulder flexion  130/WFL  Shoulder extension    Shoulder abduction  95/WFL  Shoulder adduction    Shoulder internal rotation  S1 behind back/55  Shoulder external rotation  C6 behind head/60  Elbow flexion    Elbow extension    Wrist flexion    Wrist extension     Wrist ulnar deviation    Wrist radial deviation    Wrist pronation    Wrist supination    (Blank rows = not tested)  UPPER EXTREMITY MMT:  MMT Right eval Left eval  Shoulder flexion  4+  Shoulder extension  4  Shoulder abduction  4  Shoulder adduction    Shoulder internal rotation  5  Shoulder external rotation  5  Middle trapezius  3+  Lower trapezius  3+  Elbow flexion    Elbow extension    Wrist flexion    Wrist extension    Wrist ulnar deviation    Wrist radial deviation    Wrist pronation    Wrist supination    Grip strength (lbs)    (Blank rows = not tested)  SHOULDER SPECIAL TESTS: Impingement tests: Neer impingement test: positive , Hawkins/Kennedy impingement test: positive , and Painful arc test: positive  Empty can test +  JOINT MOBILITY TESTING:  Decreased for IR/ER     TODAY'S TREATMENT:  Eval HEP creation and review with demonstration and trial set preformed, see below for details   PATIENT EDUCATION: Education details: HEP, PT plan of care Person educated: Patient Education method: Explanation, Demonstration, Verbal cues, and Handouts Education comprehension: verbalized understanding and needs further education   HOME EXERCISE PROGRAM: Access Code: [email or web address] URL: [email or web address] Date: 07/28/2023 Prepared by: Ivery Quale  Exercises - Standing Shoulder External Rotation AAROM with Dowel  - 2 x daily - 6 x weekly - 1 sets - 15 reps - 5 sec hold - Standing Shoulder Internal Rotation Stretch with Towel (Mirrored)  - 2 x daily - 6 x weekly - 1 sets - 10 reps - 10 sec hold - Prone Shoulder Horizontal Abduction (Mirrored)  - 2 x daily - 3-4 x weekly - 2 sets - 10 reps - 3 sec hold - Prone Shoulder Extension - Single Arm (Mirrored)  - 2 x daily - 3-4 x weekly - 2 sets - 10 reps - 3 sec hold - Prone  Single Arm Shoulder Y (Mirrored)  - 2 x daily - 6 x weekly - 2 sets - 10 reps - Standing Shoulder Posterior Capsule Stretch  - 2  x daily - 3-4 x weekly - 1 sets - 5 reps - 10 hold  ASSESSMENT:  CLINICAL IMPRESSION: Patient referred to PT for acute left shoulder pain that presents today with signs and symptoms consistent with shoulder impingement. Patient will benefit from skilled PT to address below impairments, limitations and improve overall function. She would like to trial HEP for now and will follow up with PT if needed within 30 days.   OBJECTIVE IMPAIRMENTS: decreased reaching tolerance, decreased shoulder mobility, decreased ROM, decreased strength, iimpaired UE use, and pain.  ACTIVITY LIMITATIONS: reaching, lifting, carry,  cleaning, driving, and or occupation  PERSONAL FACTORS: none affecting patient's functional outcome.  REHAB POTENTIAL: Good  CLINICAL DECISION MAKING: Stable/uncomplicated  EVALUATION COMPLEXITY: Low    GOALS: Short term PT Goals Target date: 08/25/2023    Pt will be I and compliant with HEP. Baseline:  Goal status: New Pt will decrease pain by 25% overall Baseline: Goal status: New  Long term PT goals Target date:09/08/2023   Pt will improve Lt shoulder AROM to [facility] to improve functional reaching Baseline: Goal status: New Pt will improve  Lt shoulder strength to at least 4+/5 MMT to improve functional strength Baseline: Goal status: New Pt will improve FOTO to at least 74% functional to show improved function Baseline: Goal status: New Pt will reduce pain to overall less than 2/10 with usual activity and work activity. Baseline: Goal status: New  PLAN: PT FREQUENCY: 1-2 times per week   PT DURATION: 6 weeks  PLANNED INTERVENTIONS (unless contraindicated): aquatic PT, Canalith repositioning, cryotherapy, Electrical stimulation, Iontophoresis with 4 mg/ml dexamethasome, Moist heat, traction, Ultrasound, gait training, Therapeutic exercise, balance training, neuromuscular re-education, patient/family education, prosthetic training, manual techniques, passive ROM,  dry needling, taping, vasopnuematic device, vestibular, spinal manipulations, joint manipulations 97110-Therapeutic exercises, 97530- Therapeutic activity, 97112- Neuromuscular re-education, 97535- Self Care, and 16109- Manual therapy  PLAN FOR NEXT SESSION: review HEP, modalities PRN, DC after 30 days if no return.    April Manson, PT,DPT 07/28/2023, 9:27 AM

## 2023-07-28 NOTE — Assessment & Plan Note (Addendum)
Recent death of her paternal grandparents.  Her father is one of her major family support people.  We discussed support groups through hospice as her grandfather was in hospice.  We discussed the stages of grief today that include denial, anger, bargaining, depression and acceptance.  These stages are not linear.  Her father is still caring for a homebound sibling and his sister (patient's aunt also struggling with mental clarity issues after chemo and hoarding issues).  We discussed how to support her father through the holidays which is likely to be difficult given the first year and experience after death.  We discussed grief share as a daily email devotion that is available for free.  Time spent on counseling was 10 minutes.

## 2023-07-28 NOTE — Assessment & Plan Note (Signed)
On Rx Vitamin D.  Last level done in June of this year and was 37.  No nausea, vomiting or muscle weakness.  She needs a refill today.

## 2023-07-28 NOTE — Assessment & Plan Note (Signed)
Patient on naltrexone with some control of emotional eating and snacking.  She denies any GI side effects of nausea.  She needs a refill today.

## 2023-07-28 NOTE — Assessment & Plan Note (Signed)
On lasix and takes as needed.  She is experiencing good control of edema in her lower extremities with the furosemide.  She takes 1-2 depending on symptoms.  Last kidney function tests were normal.

## 2023-07-28 NOTE — Progress Notes (Unsigned)
[facility] MEDICAL WEIGHT [facility] HEALTHY WEIGHT & WELLNESS AT [place] [address] [place] [address] Dept: [phone] Dept Fax: [phone]  SUBJECTIVE:  Chief Complaint: Obesity  Interim History: Patient has had a really rough month.  She has buried two of her grandparents in the last two weeks and has had to call overnight care for the grandparent that is still alive.  One of the deaths was very unexpected. Her father has been grieving and that has really affected her.  She has done some emotional eating due to this.  She has not been able to fully do all the things that she had committed to and that has really played into her stress. She and her family have committed to going on a cruise in May for celebration of her father's retirement.   Isabella Larson is here to discuss her progress with her obesity treatment plan. She is on the Category 4 Plan and states she is following her eating plan approximately 25 % of the time. She states she is not exercising.   OBJECTIVE: Visit Diagnoses: Problem List Items Addressed This Visit       Other   Vitamin D deficiency    On Rx Vitamin D.  Last level done in June of this year and was 37.  No nausea, vomiting or muscle weakness.  She needs a refill today.      Relevant Medications   Vitamin D, Ergocalciferol, (DRISDOL) 1.25 MG (50000 UNIT) CAPS capsule   Edema of both lower extremities    On lasix and takes as needed.  She is experiencing good control of edema in her lower extremities with the furosemide.  She takes 1-2 depending on symptoms.  Last kidney function tests were normal.      Relevant Medications   furosemide (LASIX) 20 MG tablet   Polyphagia    Patient on naltrexone with some control of emotional eating and snacking.  She denies any GI side effects of nausea.  She needs a refill today.      Relevant Medications   naltrexone (DEPADE) 50 MG tablet   Obesity with starting BMI of 46.3   BMI 40.0-44.9,  adult Current BMI 42.1   At high risk for altered family dynamics - Primary    Recent death of her paternal grandparents.  Her father is one of her major family support people.  We discussed support groups through hospice as her grandfather was in hospice.  We discussed the stages of grief today that include denial, anger, bargaining, depression and acceptance.  These stages are not linear.  Her father is still caring for a homebound sibling and his sister (patient's aunt also struggling with mental clarity issues after chemo and hoarding issues).  We discussed how to support her father through the holidays which is likely to be difficult given the first year and experience after death.  We discussed grief share as a daily email devotion that is available for free.  Time spent on counseling was 10 minutes.       Vitals Temp: 98.2 F (36.8 C) BP: 123/79 Pulse Rate: 83 SpO2: 98 %   Anthropometric Measurements Height: 5\' 8"  (1.727 m) Weight: 284 lb (128.8 kg) BMI (Calculated): 43.19 Weight at Last Visit: 279 lbs Weight Lost Since Last Visit: 0 Weight Gained Since Last Visit: 5 Starting Weight: 305 lb Total Weight Loss (lbs): 21 lb (9.526 kg)   Body Composition  Body Fat %: 52.8 % Fat Mass (lbs): 150.2 lbs  Muscle Mass (lbs): 127.6 lbs Visceral Fat Rating : 16   Other Clinical Data Today's Visit #: 70 Starting Date: 04/18/19     ASSESSMENT AND PLAN:  Diet: Isabella Larson is currently in the action stage of change. As such, her goal is to continue with weight loss efforts. She has agreed to Category 4 Plan.  Exercise: Isabella Larson has been instructed that some exercise is better than none for weight loss and overall health benefits.   Behavior Modification:  We discussed the following Behavioral Modification Strategies today: increasing lean protein intake, better snacking choices, holiday eating strategies, avoiding temptations, and planning for success.   No follow-ups on  file.[name] She was informed of the importance of frequent follow up visits to maximize her success with intensive lifestyle modifications for her multiple health conditions.  Attestation Statements:   Reviewed by clinician on day of visit: allergies, medications, problem list, medical history, surgical history, family history, social history, and previous encounter notes.   Time spent on visit including pre-visit chart review and post-visit care and charting was 30 minutes.    Reuben Likes, MD

## 2023-07-29 ENCOUNTER — Ambulatory Visit (INDEPENDENT_AMBULATORY_CARE_PROVIDER_SITE_OTHER): Payer: BC Managed Care – PPO | Admitting: Family Medicine

## 2023-08-07 ENCOUNTER — Other Ambulatory Visit (INDEPENDENT_AMBULATORY_CARE_PROVIDER_SITE_OTHER): Payer: Self-pay | Admitting: Family Medicine

## 2023-08-07 DIAGNOSIS — K9189 Other postprocedural complications and disorders of digestive system: Secondary | ICD-10-CM

## 2023-08-15 ENCOUNTER — Other Ambulatory Visit (INDEPENDENT_AMBULATORY_CARE_PROVIDER_SITE_OTHER): Payer: Self-pay | Admitting: Family Medicine

## 2023-08-15 DIAGNOSIS — D508 Other iron deficiency anemias: Secondary | ICD-10-CM

## 2023-08-19 ENCOUNTER — Other Ambulatory Visit (INDEPENDENT_AMBULATORY_CARE_PROVIDER_SITE_OTHER): Payer: Self-pay

## 2023-08-19 ENCOUNTER — Telehealth (INDEPENDENT_AMBULATORY_CARE_PROVIDER_SITE_OTHER): Payer: Self-pay | Admitting: Family Medicine

## 2023-08-19 ENCOUNTER — Encounter (INDEPENDENT_AMBULATORY_CARE_PROVIDER_SITE_OTHER): Payer: Self-pay

## 2023-08-19 DIAGNOSIS — D508 Other iron deficiency anemias: Secondary | ICD-10-CM

## 2023-08-19 MED ORDER — FUSION PLUS PO CAPS: 1.0000 | ORAL_CAPSULE | Freq: Every day | ORAL | 0 refills | Status: DC

## 2023-08-19 NOTE — Telephone Encounter (Signed)
Pt is inquiring about her Rx iron medication that was supposed to be called in at her last visit.  Pharm:  [facility] on [address].    Pt would like to see if she can get it b/c she will be out of it before her next visit on October 01, 2023 @ 7:40.

## 2023-08-19 NOTE — Telephone Encounter (Signed)
Prescription sent and message sent to pt

## 2023-08-25 ENCOUNTER — Ambulatory Visit (INDEPENDENT_AMBULATORY_CARE_PROVIDER_SITE_OTHER): Payer: BC Managed Care – PPO | Admitting: Family Medicine

## 2023-08-27 ENCOUNTER — Ambulatory Visit: Payer: BC Managed Care – PPO | Admitting: Physician Assistant

## 2023-10-01 ENCOUNTER — Encounter (INDEPENDENT_AMBULATORY_CARE_PROVIDER_SITE_OTHER): Payer: Self-pay | Admitting: Family Medicine

## 2023-10-01 ENCOUNTER — Ambulatory Visit (INDEPENDENT_AMBULATORY_CARE_PROVIDER_SITE_OTHER): Payer: 59 | Admitting: Family Medicine

## 2023-10-01 VITALS — BP 128/75 | HR 66 | Temp 97.8°F | Ht 68.0 in | Wt 283.0 lb

## 2023-10-01 DIAGNOSIS — E559 Vitamin D deficiency, unspecified: Secondary | ICD-10-CM

## 2023-10-01 DIAGNOSIS — E038 Other specified hypothyroidism: Secondary | ICD-10-CM | POA: Diagnosis not present

## 2023-10-01 DIAGNOSIS — R7303 Prediabetes: Secondary | ICD-10-CM

## 2023-10-01 DIAGNOSIS — K9189 Other postprocedural complications and disorders of digestive system: Secondary | ICD-10-CM | POA: Diagnosis not present

## 2023-10-01 DIAGNOSIS — R600 Localized edema: Secondary | ICD-10-CM

## 2023-10-01 DIAGNOSIS — E669 Obesity, unspecified: Secondary | ICD-10-CM

## 2023-10-01 DIAGNOSIS — D508 Other iron deficiency anemias: Secondary | ICD-10-CM | POA: Diagnosis not present

## 2023-10-01 DIAGNOSIS — Z6841 Body Mass Index (BMI) 40.0 and over, adult: Secondary | ICD-10-CM

## 2023-10-01 MED ORDER — FUSION PLUS PO CAPS: 1.0000 | ORAL_CAPSULE | Freq: Every day | ORAL | 0 refills | Status: DC

## 2023-10-01 MED ORDER — METFORMIN HCL 500 MG PO TABS: 500.0000 mg | ORAL_TABLET | Freq: Every day | ORAL | 0 refills | Status: DC

## 2023-10-01 MED ORDER — FUROSEMIDE 20 MG PO TABS: 20.0000 mg | ORAL_TABLET | Freq: Every day | ORAL | 0 refills | Status: DC

## 2023-10-01 MED ORDER — LEVOTHYROXINE SODIUM 200 MCG PO TABS: 200.0000 ug | ORAL_TABLET | Freq: Every day | ORAL | 0 refills | Status: DC

## 2023-10-01 MED ORDER — VITAMIN D (ERGOCALCIFEROL) 1.25 MG (50000 UNIT) PO CAPS: 50000.0000 [IU] | ORAL_CAPSULE | ORAL | 0 refills | Status: DC

## 2023-10-01 NOTE — Progress Notes (Signed)
 SUBJECTIVE:  Chief Complaint: Obesity  Interim History: Enjoyed the holidays but entire family got sick at some point with gastrointestinal illnesses or URI.  She stayed local except for New Year's Eve. Planning to stay local until May when she is going on a cruise.  Goal is to get to 250lbs by then because some of the activities are weight limited onboard.  She has started indoor walking which is a video that she can do at home. Foodwise she didn't stick to the plan over Christmas and tried hard to not completely overeat.  She tried to be conscious of choice and not eat everything in sight.  She made [employer] and normally she donates a large amount to different firehouses but due to illness she did not get to so all of it stayed at her home. When New Year's started she got rid of all the indulgent foods.  Isabella Larson is here to discuss her progress with her obesity treatment plan. She is on the Category 4 Plan and states she is following her eating plan approximately 25 % of the time. She states she is walking 30 minutes 2 times thisweek.   OBJECTIVE: Visit Diagnoses: Problem List Items Addressed This Visit       Endocrine   Other specified hypothyroidism- post-surgical  (Chronic)   On Synthroid  200mcg daily.  No cold intolerance, heat intolerance or palpitations.  Needs refill of Synthroid  today and will recheck TSH.      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  Needs CBC and Anemia Panel today.  Last CBC WNL.  Needs refill of fusion plus.  Will discuss labs at next appointment.      Relevant Medications   Iron -FA-B Cmp-C-Biot-Probiotic (FUSION PLUS) CAPS   Other Relevant Orders   CBC w/Diff/Platelet (Completed)   Anemia panel (Completed)   Prediabetes   On metformin  with no significant side effects of cramping, bloating or diarrhea.  Needs  refill of metformin  today.  Will also do labs as patient is fasting- CMP, Insulin  and A1c.  Will plan to discuss labs at next appointment.      Relevant Medications   metFORMIN  (GLUCOPHAGE ) 500 MG tablet   Other Relevant Orders   Comprehensive metabolic panel (Completed)   Hemoglobin A1c (Completed)   Insulin , random (Completed)   Vitamin D  deficiency   Doing well on supplementation but last Rx was not called in.  Needs refill of Vitamin D  and to recheck Vitamin D  level.      Relevant Medications   Vitamin D , Ergocalciferol , (DRISDOL ) 1.25 MG (50000 UNIT) CAPS capsule   Other Relevant Orders   VITAMIN D  25 Hydroxy (Vit-D Deficiency, Fractures) (Completed)   Edema of both lower extremities   Symptoms well controlled on furosemide .  Needs refill today.  Will also check kidney function test.      Relevant Medications   furosemide  (LASIX ) 20 MG tablet    No data recorded No data recorded No data recorded No data recorded   ASSESSMENT AND PLAN:  Diet: Natalee is currently in the action stage of change. As such, her goal is to continue with weight loss efforts. She has agreed to Category 4 Plan.  Exercise: Hallie has been instructed that some exercise is better than none for weight loss and overall health benefits.   Behavior Modification:  We discussed the following Behavioral Modification Strategies today: increasing lean protein intake, increasing vegetables, no skipping meals, better snacking choices, avoiding temptations, and planning for success.   No follow-ups on file.[name] She was informed of the importance of frequent follow up visits to maximize her success with intensive lifestyle modifications for her multiple health conditions.  Attestation Statements:   Reviewed by clinician on day of visit: allergies, medications, problem list, medical history, surgical history, family history, social history, and previous encounter notes.    [name], MD

## 2023-10-03 LAB — CBC WITH DIFFERENTIAL/PLATELET
Basophils Absolute: 0 10*3/uL (ref 0.0–0.2)
Basos: 0 %
EOS (ABSOLUTE): 0.2 10*3/uL (ref 0.0–0.4)
Eos: 2 %
Hemoglobin: 11.4 g/dL (ref 11.1–15.9)
Immature Grans (Abs): 0 10*3/uL (ref 0.0–0.1)
Immature Granulocytes: 0 %
Lymphocytes Absolute: 1.7 10*3/uL (ref 0.7–3.1)
Lymphs: 24 %
MCH: 28.5 pg (ref 26.6–33.0)
MCHC: 32.5 g/dL (ref 31.5–35.7)
MCV: 88 fL (ref 79–97)
Monocytes Absolute: 0.6 10*3/uL (ref 0.1–0.9)
Monocytes: 8 %
Neutrophils Absolute: 4.7 10*3/uL (ref 1.4–7.0)
Neutrophils: 66 %
Platelets: 275 10*3/uL (ref 150–450)
RBC: 4 x10E6/uL (ref 3.77–5.28)
RDW: 12.8 % (ref 11.7–15.4)
WBC: 7.1 10*3/uL (ref 3.4–10.8)

## 2023-10-03 LAB — ANEMIA PANEL
Ferritin: 32 ng/mL (ref 15–150)
Folate, Hemolysate: 410 ng/mL
Folate, RBC: 1168 ng/mL (ref >498)
Hematocrit: 35.1 % (ref 34.0–46.6)
Iron Saturation: 20 % (ref 15–55)
Iron: 63 ug/dL (ref 27–159)
Retic Ct Pct: 1.3 % (ref 0.6–2.6)
Total Iron Binding Capacity: 319 ug/dL (ref 250–450)
UIBC: 256 ug/dL (ref 131–425)
Vitamin B-12: 557 pg/mL (ref 232–1245)

## 2023-10-03 LAB — COMPREHENSIVE METABOLIC PANEL
ALT: 32 [IU]/L (ref 0–32)
AST: 26 [IU]/L (ref 0–40)
Albumin: 3.9 g/dL (ref 3.9–4.9)
Alkaline Phosphatase: 106 [IU]/L (ref 44–121)
BUN/Creatinine Ratio: 19 (ref 9–23)
BUN: 14 mg/dL (ref 6–24)
Bilirubin Total: 0.2 mg/dL (ref 0.0–1.2)
CO2: 24 mmol/L (ref 20–29)
Calcium: 9 mg/dL (ref 8.7–10.2)
Chloride: 108 mmol/L — ABNORMAL HIGH (ref 96–106)
Creatinine, Ser: 0.73 mg/dL (ref 0.57–1.00)
Globulin, Total: 2.4 g/dL (ref 1.5–4.5)
Glucose: 113 mg/dL — ABNORMAL HIGH (ref 70–99)
Potassium: 4.5 mmol/L (ref 3.5–5.2)
Sodium: 141 mmol/L (ref 134–144)
Total Protein: 6.3 g/dL (ref 6.0–8.5)
eGFR: 106 mL/min/{1.73_m2} (ref >59)

## 2023-10-03 LAB — VITAMIN D 25 HYDROXY (VIT D DEFICIENCY, FRACTURES): Vit D, 25-Hydroxy: 56.3 ng/mL (ref 30.0–100.0)

## 2023-10-03 LAB — TSH: TSH: 4.29 u[IU]/mL (ref 0.450–4.500)

## 2023-10-03 LAB — INSULIN, RANDOM: INSULIN: 17.7 u[IU]/mL (ref 2.6–24.9)

## 2023-10-03 LAB — HEMOGLOBIN A1C
Est. average glucose Bld gHb Est-mCnc: 117 mg/dL
Hgb A1c MFr Bld: 5.7 % — ABNORMAL HIGH (ref 4.8–5.6)

## 2023-10-08 NOTE — Assessment & Plan Note (Signed)
On Fusion plus daily.  Needs CBC and Anemia Panel today.  Last CBC WNL.  Needs refill of fusion plus.  Will discuss labs at next appointment.

## 2023-10-08 NOTE — Assessment & Plan Note (Signed)
On metformin with no significant side effects of cramping, bloating or diarrhea.  Needs refill of metformin today.  Will also do labs as patient is fasting- CMP, Insulin and A1c.  Will plan to discuss labs at next appointment.

## 2023-10-08 NOTE — Assessment & Plan Note (Signed)
Doing well on supplementation but last Rx was not called in.  Needs refill of Vitamin D and to recheck Vitamin D level.

## 2023-10-08 NOTE — Assessment & Plan Note (Signed)
Symptoms well controlled on furosemide.  Needs refill today.  Will also check kidney function test.

## 2023-10-08 NOTE — Assessment & Plan Note (Signed)
On Synthroid daily.  No cold intolerance, heat intolerance or palpitations.  Needs refill of Synthroid today and will recheck TSH.

## 2023-10-22 ENCOUNTER — Encounter: Payer: Self-pay | Admitting: Physician Assistant

## 2023-10-22 ENCOUNTER — Ambulatory Visit (INDEPENDENT_AMBULATORY_CARE_PROVIDER_SITE_OTHER): Payer: 59 | Admitting: Physician Assistant

## 2023-10-22 DIAGNOSIS — M25512 Pain in left shoulder: Secondary | ICD-10-CM | POA: Diagnosis not present

## 2023-10-22 MED ORDER — LIDOCAINE HCL 1 % IJ SOLN
3.0000 mL | INTRAMUSCULAR | Status: AC | PRN
  Administered 2023-10-22: 3 mL

## 2023-10-22 MED ORDER — METHYLPREDNISOLONE ACETATE 40 MG/ML IJ SUSP
40.0000 mg | INTRAMUSCULAR | Status: AC | PRN
  Administered 2023-10-22: 40 mg via INTRA_ARTICULAR

## 2023-10-22 NOTE — Progress Notes (Signed)
   Procedure Note  Patient: Isabella Larson             Date of Birth: December 31, 1980           MRN: [id number]             Visit Date: 10/22/2023  Isabella Larson comes in today due to left shoulder pain that is been ongoing for now 4 months.  She did go to therapy and found this to be helpful as far as her range of motion of her shoulder and continue to do the home exercises until the holidays.  Has stopped somewhat doing these.  She denies any numbness tingling down the arm or neck pain.  She states she still having difficulty reaching behind her.  She continues to take Advil as needed for the shoulder pain.  She reports that her glucose is under good control.  Review of systems: See HPI otherwise negative or noncontributory denies any recent vaccines fevers or chills.  Physical exam: General Well-developed well-nourished female no acute distress mood and affect appropriate. Respirations: Unlabored Bilateral shoulders 5 out of 5 strength with external and internal rotation against resistance.  She has near full active overhead range of motion of the left shoulder.  Impingement testing positive on the left negative on the right.  Empty can test negative bilaterally.  Liftoff test is negative bilaterally.  Nontender about the scapula and trapezius region on the left.  Cervical spine: Good range of motion without pain.   Procedures: Visit Diagnoses:  1. Acute pain of left shoulder     Large Joint Inj: L subacromial bursa on 10/22/2023 4:29 PM Indications: pain Details: 22 G 1.5 in needle, superior approach  Arthrogram: No  Medications: 3 mL lidocaine 1 %; 40 mg methylPREDNISolone acetate 40 MG/ML Outcome: tolerated well, no immediate complications Procedure, treatment alternatives, risks and benefits explained, specific risks discussed. Consent was given by the patient. Immediately prior to procedure a time out was called to verify the correct patient, procedure, equipment, support staff and  site/side marked as required. Patient was prepped and draped in the usual sterile fashion.    Plan: She will continue her home exercises.  She will call us in 2 weeks if pain pain persist we could pain an MRI at that point in time to evaluate her rotator cuff.  Questions were encouraged and answered at length.

## 2023-10-29 ENCOUNTER — Ambulatory Visit (INDEPENDENT_AMBULATORY_CARE_PROVIDER_SITE_OTHER): Payer: 59 | Admitting: Family Medicine

## 2023-10-29 ENCOUNTER — Encounter (INDEPENDENT_AMBULATORY_CARE_PROVIDER_SITE_OTHER): Payer: Self-pay | Admitting: Family Medicine

## 2023-10-29 VITALS — BP 114/77 | HR 69 | Temp 97.8°F | Ht 68.0 in | Wt 273.0 lb

## 2023-10-29 DIAGNOSIS — E559 Vitamin D deficiency, unspecified: Secondary | ICD-10-CM | POA: Diagnosis not present

## 2023-10-29 DIAGNOSIS — E669 Obesity, unspecified: Secondary | ICD-10-CM

## 2023-10-29 DIAGNOSIS — Z6841 Body Mass Index (BMI) 40.0 and over, adult: Secondary | ICD-10-CM

## 2023-10-29 DIAGNOSIS — R7303 Prediabetes: Secondary | ICD-10-CM | POA: Diagnosis not present

## 2023-10-29 DIAGNOSIS — D508 Other iron deficiency anemias: Secondary | ICD-10-CM | POA: Diagnosis not present

## 2023-10-29 DIAGNOSIS — K9189 Other postprocedural complications and disorders of digestive system: Secondary | ICD-10-CM

## 2023-10-29 MED ORDER — VITAMIN D (ERGOCALCIFEROL) 1.25 MG (50000 UNIT) PO CAPS: 50000.0000 [IU] | ORAL_CAPSULE | ORAL | 0 refills | Status: DC

## 2023-10-29 NOTE — Progress Notes (Signed)
 SUBJECTIVE:  Chief Complaint: Obesity  Interim History: Patient has been getting over URI the last couple of weeks.  Last part of January has been pretty good- when she was sick she wasn't able to get all the food in.  She wasn't able to eat everything during that time but is working on getting total amount of protein in.  Her appetite has just really decreased during her recovery from her uri. Not much planned for this month. She is stocked on ground beef and chicken.  She has all the food ready to meal prep for the upcoming few weeks.   Isabella Larson is here to discuss her progress with her obesity treatment plan. She is on the Category 4 Plan and states she is following her eating plan approximately 75 % of the time. She states she is walking exercising 20 minutes 2-3 times per week.   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  Her iron  levels in terms of iron , ferritin, iron  saturation, hemoglobin, hematocrit were normal at labs done at last appointment.  Patient is taking fusion plus iron  supplementation and has having no GI side effects from this.      Prediabetes   Discussed recent labs showing an improvement in A1c from 6.1-5.7 with an increase in insulin  level as expected with a decrease in A1c.  Patient has been consistent with her meal plan over the last few months.  She is modifying her intake of simple carbohydrates and focusing on protein intake.  She is on metformin  daily and is experiencing no GI side effects from this.  She does not need a refill at this time.      Vitamin D  deficiency   Labs done at last appointment showing a vitamin D  level of 56.3.  Patient has been consistent with her vitamin D  supplementation.  She needs a refill of her prescription strength supplementation today.  We discussed risk of  oversupplementation and will not change current medication from frequency that it is currently but will need to repeat labs in 3 months.      Relevant Medications   Vitamin D , Ergocalciferol , (DRISDOL ) 1.25 MG (50000 UNIT) CAPS capsule    No data recorded  No data recorded  No data recorded  No data recorded    10/29/2023    3:00 PM 10/01/2023    7:00 AM 07/28/2023    1:00 PM  Vitals with BMI  Height 5' 8 5' 8 5' 8  Weight 273 lbs 283 lbs 284 lbs  BMI 41.52 43.04 43.19  Systolic 114 128 876  Diastolic 77 75 79  Pulse 69 66 83      ASSESSMENT AND PLAN:  Diet: Isabella Larson is currently in the action stage of change. As such, her goal is to continue with weight loss efforts. She has agreed to Category 4 Plan.  Exercise: Isabella Larson has been instructed that some exercise is better than none and to continue exercising as is for weight loss and overall health benefits.Patient feels like the amount of walking exercises she has been doing to be sustainable the next few weeks.   Behavior Modification:  We discussed the following Behavioral Modification Strategies today: increasing lean protein intake, increasing vegetables, no skipping meals, meal planning and cooking strategies, keeping healthy foods in the home, and planning for success.  No follow-ups on file.[name] She was informed of the importance of frequent follow up visits to maximize her success with intensive lifestyle modifications for her multiple health conditions.  Attestation Statements:   Reviewed by clinician on day of visit: allergies, medications, problem list, medical history, surgical history, family history, social history, and previous encounter notes.      [name], MD

## 2023-11-01 ENCOUNTER — Other Ambulatory Visit (INDEPENDENT_AMBULATORY_CARE_PROVIDER_SITE_OTHER): Payer: Self-pay | Admitting: Family Medicine

## 2023-11-01 DIAGNOSIS — E559 Vitamin D deficiency, unspecified: Secondary | ICD-10-CM

## 2023-11-03 NOTE — Assessment & Plan Note (Signed)
Patient has struggled with iron deficiency after her sleeve gastrectomy.  Her iron levels in terms of iron, ferritin, iron saturation, hemoglobin, hematocrit were normal at labs done at last appointment.  Patient is taking fusion plus iron supplementation and has having no GI side effects from this.

## 2023-11-03 NOTE — Assessment & Plan Note (Signed)
Labs done at last appointment showing a vitamin D level of 56.3.  Patient has been consistent with her vitamin D supplementation.  She needs a refill of her prescription strength supplementation today.  We discussed risk of oversupplementation and will not change current medication from frequency that it is currently but will need to repeat labs in 3 months.

## 2023-11-03 NOTE — Assessment & Plan Note (Signed)
Discussed recent labs showing an improvement in A1c from 6.1-5.7 with an increase in insulin level as expected with a decrease in A1c.  Patient has been consistent with her meal plan over the last few months.  She is modifying her intake of simple carbohydrates and focusing on protein intake.  She is on metformin daily and is experiencing no GI side effects from this.  She does not need a refill at this time.

## 2023-11-19 ENCOUNTER — Other Ambulatory Visit (INDEPENDENT_AMBULATORY_CARE_PROVIDER_SITE_OTHER): Payer: Self-pay | Admitting: Family Medicine

## 2023-11-19 DIAGNOSIS — E559 Vitamin D deficiency, unspecified: Secondary | ICD-10-CM

## 2023-12-01 ENCOUNTER — Ambulatory Visit (INDEPENDENT_AMBULATORY_CARE_PROVIDER_SITE_OTHER): Payer: 59 | Admitting: Family Medicine

## 2023-12-01 ENCOUNTER — Encounter (INDEPENDENT_AMBULATORY_CARE_PROVIDER_SITE_OTHER): Payer: Self-pay | Admitting: Family Medicine

## 2023-12-01 VITALS — BP 111/78 | HR 68 | Temp 98.0°F | Ht 68.0 in | Wt 278.0 lb

## 2023-12-01 DIAGNOSIS — D508 Other iron deficiency anemias: Secondary | ICD-10-CM | POA: Diagnosis not present

## 2023-12-01 DIAGNOSIS — E559 Vitamin D deficiency, unspecified: Secondary | ICD-10-CM | POA: Diagnosis not present

## 2023-12-01 DIAGNOSIS — K9189 Other postprocedural complications and disorders of digestive system: Secondary | ICD-10-CM

## 2023-12-01 DIAGNOSIS — Z6841 Body Mass Index (BMI) 40.0 and over, adult: Secondary | ICD-10-CM

## 2023-12-01 DIAGNOSIS — E66813 Obesity, class 3: Secondary | ICD-10-CM

## 2023-12-01 DIAGNOSIS — E661 Drug-induced obesity: Secondary | ICD-10-CM

## 2023-12-01 MED ORDER — FUSION PLUS PO CAPS: 1.0000 | ORAL_CAPSULE | Freq: Every day | ORAL | 0 refills | Status: DC

## 2023-12-01 MED ORDER — VITAMIN D (ERGOCALCIFEROL) 1.25 MG (50000 UNIT) PO CAPS: 50000.0000 [IU] | ORAL_CAPSULE | ORAL | 0 refills | Status: DC

## 2023-12-01 NOTE — Assessment & Plan Note (Signed)
 Patient is tolerating Fusion Plus capsules without any GI side effects.  She needs a refill today.  Last labs showing improved anemia panel and normal CBC.

## 2023-12-01 NOTE — Assessment & Plan Note (Signed)
 See anthropometric measurements for full analysis.  Continue Category 4 or journaling to ensure total intake between 1650-1800 calories and 125 or more grams of protein daily.

## 2023-12-01 NOTE — Assessment & Plan Note (Signed)
 On Rx Vitamin D.  Last Vitamin D level from January at 56.3.  No nausea, vomiting or muscle weakness.

## 2023-12-01 NOTE — Progress Notes (Signed)
 SUBJECTIVE:  Chief Complaint: Obesity  Interim History: Patient had a good month of February.  She started walking during the month with her mom and went away last week with her family and didn't get much walking in then. She felt like she was sticking to the meal plan fairly strictly over the last few weeks and tried to stick closely to the calories and protein last week when she away.  She did try to look up food options when she away to get an idea of total calories and protein she was getting in.  She is planning to go to [facility] the week before Easter for spring break. That is the only planned time she is going to be out of town until her cruise.   Isabella Larson is here to discuss her progress with her obesity treatment plan. She is on the Category 4 Plan and states she is following her eating plan approximately 90 % of the time. She states she is walking 30-40 minutes 2-5 times per week.   OBJECTIVE: Visit Diagnoses: Problem List Items Addressed This Visit       Other   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   Patient is tolerating Fusion Plus capsules without any GI side effects.  She needs a refill today.  Last labs showing improved anemia panel and normal CBC.      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  Continue Category 4 or journaling to ensure total intake between 1650-1800 calories and 125 or more grams of protein daily.      Vitamin D deficiency - Primary   On Rx Vitamin D.  Last Vitamin D level from January at 56.3.  No nausea, vomiting or muscle weakness.      Relevant Medications   Vitamin D, Ergocalciferol, (DRISDOL) 1.25 MG (50000 UNIT) CAPS capsule   Obesity with starting BMI of 46.3   BMI 40.0-44.9, adult Current BMI 42.1    Vitals Temp: 98 F (36.7 C) BP: 111/78 Pulse Rate:  68 SpO2: 97 %   Anthropometric Measurements Height: 5\' 8"  (1.727 m) Weight: 278 lb (126.1 kg) BMI (Calculated): 42.28 Weight at Last Visit: 273 lb Weight Lost Since Last Visit: 0 Weight Gained Since Last Visit: 5 Starting Weight: 305 lb Total Weight Loss (lbs): 27 lb (12.2 kg)   Body Composition  Body Fat %: 41.9 % Fat Mass (lbs): 144.2 lbs Muscle Mass (lbs): 127 lbs Visceral Fat Rating : 15   Other Clinical Data Today's Visit #: 40 Starting Date: 04/18/19 Comments: Cat 4     ASSESSMENT AND PLAN:  Diet: Isabella Larson is currently in the action stage of change. As such, her goal is to continue with weight loss efforts and has agreed to the Category 4 Plan and keeping a food journal and adhering to recommended goals of 1650-1800 calories and 125 or more grams protein daily.   Exercise:  All adults should avoid inactivity. Some activity is better than none, and adults who participate in any amount of physical activity, gain some health benefits.  Behavior Modification:  We discussed the following Behavioral Modification Strategies today: increasing lean protein intake, increasing vegetables, meal planning and cooking strategies, better snacking choices, planning for success, and keep a strict food journal.   Return in about 4 weeks (around 12/29/2023).[name] She was informed of the importance  of frequent follow up visits to maximize her success with intensive lifestyle modifications for her multiple health conditions.  Attestation Statements:   Reviewed by clinician on day of visit: allergies, medications, problem list, medical history, surgical history, family history, social history, and previous encounter notes.     Reuben Likes, MD

## 2023-12-15 ENCOUNTER — Telehealth (INDEPENDENT_AMBULATORY_CARE_PROVIDER_SITE_OTHER): Payer: Self-pay | Admitting: Family Medicine

## 2023-12-27 ENCOUNTER — Other Ambulatory Visit (INDEPENDENT_AMBULATORY_CARE_PROVIDER_SITE_OTHER): Payer: Self-pay | Admitting: Family Medicine

## 2023-12-27 DIAGNOSIS — E559 Vitamin D deficiency, unspecified: Secondary | ICD-10-CM

## 2023-12-30 ENCOUNTER — Ambulatory Visit (INDEPENDENT_AMBULATORY_CARE_PROVIDER_SITE_OTHER): Admitting: Family Medicine

## 2023-12-30 ENCOUNTER — Encounter (INDEPENDENT_AMBULATORY_CARE_PROVIDER_SITE_OTHER): Payer: Self-pay | Admitting: Family Medicine

## 2023-12-30 VITALS — BP 123/83 | HR 68 | Temp 97.9°F | Ht 68.0 in | Wt 274.0 lb

## 2023-12-30 DIAGNOSIS — E669 Obesity, unspecified: Secondary | ICD-10-CM

## 2023-12-30 DIAGNOSIS — R600 Localized edema: Secondary | ICD-10-CM | POA: Diagnosis not present

## 2023-12-30 DIAGNOSIS — E559 Vitamin D deficiency, unspecified: Secondary | ICD-10-CM

## 2023-12-30 DIAGNOSIS — R7303 Prediabetes: Secondary | ICD-10-CM

## 2023-12-30 DIAGNOSIS — E038 Other specified hypothyroidism: Secondary | ICD-10-CM | POA: Diagnosis not present

## 2023-12-30 DIAGNOSIS — Z6841 Body Mass Index (BMI) 40.0 and over, adult: Secondary | ICD-10-CM

## 2023-12-30 MED ORDER — LEVOTHYROXINE SODIUM 200 MCG PO TABS: 200.0000 ug | ORAL_TABLET | Freq: Every day | ORAL | 0 refills | Status: DC

## 2023-12-30 MED ORDER — VITAMIN D (ERGOCALCIFEROL) 1.25 MG (50000 UNIT) PO CAPS: 50000.0000 [IU] | ORAL_CAPSULE | ORAL | 0 refills | Status: DC

## 2023-12-30 MED ORDER — FUROSEMIDE 20 MG PO TABS: 20.0000 mg | ORAL_TABLET | Freq: Every day | ORAL | 0 refills | Status: DC

## 2023-12-30 MED ORDER — METFORMIN HCL 500 MG PO TABS
500.0000 mg | ORAL_TABLET | Freq: Every day | ORAL | 0 refills | Status: DC
Start: 2023-12-30 — End: 2024-03-15

## 2023-12-30 NOTE — Progress Notes (Signed)
 SUBJECTIVE:  Chief Complaint: Obesity  Interim History: Patient followed her meal plan pretty strictly and has been very mindful with her food choices.  She is focusing on ensuring she is getting adequate protein in daily.  She is substituting fruit cups for actual fruit.  If she can't be as in control of food choices when eating out she is in control of her portions.  She has been walking more with her mother.  She got a fitbit and she is eager to see to see what a normal day's steps is for her.  She is going to [employer] next week in [place].  Isabella Larson is here to discuss her progress with her obesity treatment plan. She is on the Category 4 Plan and states she is following her eating plan approximately 60 % of the time. She states she is exercising 30-40 minutes 3-4 times per week.   OBJECTIVE: Visit Diagnoses: Problem List Items Addressed This Visit       Endocrine   Other specified hypothyroidism- post-surgical  (Chronic)   Patient is doing well on current dose of levothyroxine.  Needs a refill today.  No cold intolerance, heat intolerance or palpitations.      Relevant Medications   levothyroxine (SYNTHROID) 200 MCG tablet     Other   Prediabetes   Patient does feel as though metformin is giving some control of her carbohydrate intake.  No GI side effects mention today.  Needs a refill of metformin today.      Relevant Medications   metFORMIN (GLUCOPHAGE) 500 MG tablet   Vitamin D deficiency   Stable on prescription strength vitamin D.  Needs a refill today.  No nausea, vomiting, or muscle weakness reported.      Relevant Medications   Vitamin D, Ergocalciferol, (DRISDOL) 1.25 MG (50000 UNIT) CAPS capsule   Edema of both lower extremities   Patient needing occasional Lasix dose to help with edema of lower extremities.  Needs a refill of Lasix today.      Relevant Medications   furosemide (LASIX) 20 MG tablet   Obesity with starting BMI of 46.3 - Primary    Anthropometric Measurements Height: 5\' 8"  (1.727 m) Weight: 274 lb (124.3 kg) BMI (Calculated): 41.67 Weight at Last Visit: 278 lb Weight Lost Since Last Visit: 4 Weight Gained Since Last Visit: 0 Starting Weight: 305 lb Total Weight Loss (lbs): 31 lb (14.1 kg) Body Composition  Body Fat %: 50.7 % Fat Mass (lbs): 139.2 lbs Muscle Mass (lbs): 128.4 lbs Visceral Fat Rating : 14 Other Clinical Data Today's Visit #: 7 Starting Date: 04/18/19 Comments: Cat 4       Relevant Medications   metFORMIN (GLUCOPHAGE) 500 MG tablet   BMI 40.0-44.9, adult Current BMI 42.1   Relevant Medications   metFORMIN (GLUCOPHAGE) 500 MG tablet    No data recorded       12/30/2023    2:00 PM 12/01/2023    3:00 PM 10/29/2023    3:00 PM  Vitals with BMI  Height 5\' 8"  5\' 8"  5\' 8"   Weight 274 lbs 278 lbs 273 lbs  BMI 41.67 42.28 41.52  Systolic 123 111 161  Diastolic 83 78 77  Pulse 68 68 69      ASSESSMENT AND PLAN:  Diet: Isabella Larson is currently in the action stage of change. As such, her goal is to continue with weight loss efforts and has agreed to the Category 4 Plan and keeping a food journal and adhering  to recommended goals of 1650-1800 calories and 125 or more grams of protein daily.   Exercise:  For substantial health benefits, adults should do at least 150 minutes (2 hours and 30 minutes) a week of moderate-intensity, or 75 minutes (1 hour and 15 minutes) a week of vigorous-intensity aerobic physical activity, or an equivalent combination of moderate- and vigorous-intensity aerobic activity. Aerobic activity should be performed in episodes of at least 10 minutes, and preferably, it should be spread throughout the week.  Behavior Modification:  We discussed the following Behavioral Modification Strategies today: increasing lean protein intake, decreasing simple carbohydrates, meal planning and cooking strategies, avoiding temptations, and planning for success.   Return in about 5  weeks (around 02/03/2024).Aaron Aas She was informed of the importance of frequent follow up visits to maximize her success with intensive lifestyle modifications for her multiple health conditions.  Attestation Statements:   Reviewed by clinician on day of visit: allergies, medications, problem list, medical history, surgical history, family history, social history, and previous encounter notes.    Donaciano Frizzle, MD

## 2024-01-06 NOTE — Assessment & Plan Note (Signed)
 Patient needing occasional Lasix dose to help with edema of lower extremities.  Needs a refill of Lasix today.

## 2024-01-06 NOTE — Assessment & Plan Note (Signed)
 Stable on prescription strength vitamin D.  Needs a refill today.  No nausea, vomiting, or muscle weakness reported.

## 2024-01-06 NOTE — Assessment & Plan Note (Signed)
 Anthropometric Measurements Height: 5\' 8"  (1.727 m) Weight: 274 lb (124.3 kg) BMI (Calculated): 41.67 Weight at Last Visit: 278 lb Weight Lost Since Last Visit: 4 Weight Gained Since Last Visit: 0 Starting Weight: 305 lb Total Weight Loss (lbs): 31 lb (14.1 kg) Body Composition  Body Fat %: 50.7 % Fat Mass (lbs): 139.2 lbs Muscle Mass (lbs): 128.4 lbs Visceral Fat Rating : 14 Other Clinical Data Today's Visit #: 46 Starting Date: 04/18/19 Comments: Cat 4

## 2024-01-06 NOTE — Assessment & Plan Note (Signed)
 Patient does feel as though metformin is giving some control of her carbohydrate intake.  No GI side effects mention today.  Needs a refill of metformin today.

## 2024-01-06 NOTE — Assessment & Plan Note (Signed)
 Patient is doing well on current dose of levothyroxine.  Needs a refill today.  No cold intolerance, heat intolerance or palpitations.

## 2024-02-04 ENCOUNTER — Encounter (INDEPENDENT_AMBULATORY_CARE_PROVIDER_SITE_OTHER): Payer: Self-pay | Admitting: Family Medicine

## 2024-02-04 ENCOUNTER — Ambulatory Visit (INDEPENDENT_AMBULATORY_CARE_PROVIDER_SITE_OTHER): Admitting: Family Medicine

## 2024-02-04 DIAGNOSIS — R600 Localized edema: Secondary | ICD-10-CM | POA: Diagnosis not present

## 2024-02-04 DIAGNOSIS — E669 Obesity, unspecified: Secondary | ICD-10-CM

## 2024-02-04 DIAGNOSIS — R7303 Prediabetes: Secondary | ICD-10-CM | POA: Diagnosis not present

## 2024-02-04 DIAGNOSIS — E559 Vitamin D deficiency, unspecified: Secondary | ICD-10-CM

## 2024-02-04 DIAGNOSIS — Z6841 Body Mass Index (BMI) 40.0 and over, adult: Secondary | ICD-10-CM

## 2024-02-04 MED ORDER — VITAMIN D (ERGOCALCIFEROL) 1.25 MG (50000 UNIT) PO CAPS: 50000.0000 [IU] | ORAL_CAPSULE | ORAL | 0 refills | Status: DC

## 2024-02-04 MED ORDER — FUROSEMIDE 20 MG PO TABS: 20.0000 mg | ORAL_TABLET | Freq: Every day | ORAL | 0 refills | Status: DC

## 2024-02-04 MED ORDER — TIRZEPATIDE-WEIGHT MANAGEMENT 2.5 MG/0.5ML ~~LOC~~ SOLN: 2.5000 mg | SUBCUTANEOUS | 0 refills | Status: DC

## 2024-02-04 NOTE — Progress Notes (Signed)
 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  This really affected her and she has done quite a bit of comfort.  She mentions the school is undergoing rezoning.  Has gotten better this week but mentions everytime she thinks about it she gets emotional.  She has applied to other jobs. She has a cruise next week and is leaving June 9th for a baseball tournament.  The summer is busy with quite a few trips and events. She has waivered back and forth between comfort eating and not wanting to eat.  Wants to refocus on protein.  Isabella Larson is here to discuss her progress with her obesity treatment plan. She is on the Category 4 Plan and states she is following her eating plan approximately 20 % of the time. She states she is not exercising.   OBJECTIVE: Visit Diagnoses: Problem List Items Addressed This Visit       Other   Prediabetes   Last A1c of 6.1 last year.  Labs not ordered today.  Will see if patient can get back on tirzepatide to help with better control of simple carbohydrate intake.       Vitamin D  deficiency   On Rx Vitamin D .  No nausea, vomiting or muscle weakness.  Needs a refill of Vitamin D  today.      Relevant Medications   Vitamin D , Ergocalciferol , (DRISDOL ) 1.25 MG (50000 UNIT) CAPS capsule   Edema of both lower extremities   Occasional swelling of lower extremities bilaterally.  Patient needs refill of lasix  today.  Will follow up on symptom control at next appointment.      Relevant Medications   furosemide  (LASIX ) 20 MG tablet   Obesity with starting BMI of 46.3 - Primary   Anthropometric Measurements Height: 5\' 8"  (1.727 m) Weight: 288 lb (130.6 kg) BMI (Calculated): 43.8 Weight at Last Visit: 274 lb Weight Lost Since Last Visit: 0 Weight Gained Since Last Visit: 14 lb Starting Weight: 305 lb Total Weight Loss (lbs): 17 lb (7.711 kg) Body Composition  Body Fat %: 43.7  % Fat Mass (lbs): 126.2 lbs Muscle Mass (lbs): 154.4 lbs Visceral Fat Rating : 13 Other Clinical Data Fasting: No Labs: No Today's Visit #: 71 Starting Date: 04/18/19       Relevant Medications   tirzepatide (ZEPBOUND) 2.5 MG/0.5ML injection vial    No data recorded       02/04/2024    3:00 PM 12/30/2023    2:00 PM 12/01/2023    3:00 PM  Vitals with BMI  Height 5\' 8"  5\' 8"  5\' 8"   Weight 288 lbs 274 lbs 278 lbs  BMI 43.8 41.67 42.28  Systolic 119 123 161  Diastolic 72 83 78  Pulse 79 68 68      ASSESSMENT AND PLAN:  Diet: [name] is currently in the action stage of change. As such, her goal is to continue with weight loss efforts and has agreed to the Category 4 Plan.   Exercise:  For substantial health benefits, adults should do at least 150 minutes (2 hours and 30 minutes) a week of moderate-intensity, or 75 minutes (1 hour and 15 minutes) a week of vigorous-intensity aerobic physical activity, or an equivalent combination of moderate- and vigorous-intensity aerobic activity. Aerobic activity should be performed in episodes of at least 10 minutes, and preferably, it should be spread throughout the week.  Behavior Modification:  We discussed the  following Behavioral Modification Strategies today: increasing lean protein intake, decreasing simple carbohydrates, increasing vegetables, meal planning and cooking strategies, and avoiding temptations. We discussed various medication options to help [name] with her weight loss efforts and we both agreed to start zepbound at 2.5mg  dose in the vials.  Return in about 4 weeks (around 03/03/2024).   She was informed of the importance of frequent follow up visits to maximize her success with intensive lifestyle modifications for her multiple health conditions.  Attestation Statements:   Reviewed by clinician on day of visit: allergies, medications, problem list, medical history, surgical history, family history, social history,  and previous encounter notes.     Donaciano Frizzle, MD

## 2024-02-08 ENCOUNTER — Encounter (INDEPENDENT_AMBULATORY_CARE_PROVIDER_SITE_OTHER): Payer: Self-pay | Admitting: Family Medicine

## 2024-02-09 ENCOUNTER — Telehealth: Admitting: Family Medicine

## 2024-02-09 ENCOUNTER — Other Ambulatory Visit (INDEPENDENT_AMBULATORY_CARE_PROVIDER_SITE_OTHER): Payer: Self-pay

## 2024-02-09 DIAGNOSIS — E038 Other specified hypothyroidism: Secondary | ICD-10-CM

## 2024-02-09 DIAGNOSIS — J309 Allergic rhinitis, unspecified: Secondary | ICD-10-CM

## 2024-02-09 MED ORDER — LEVOTHYROXINE SODIUM 200 MCG PO TABS: 200.0000 ug | ORAL_TABLET | Freq: Every day | ORAL | 0 refills | Status: DC

## 2024-02-09 MED ORDER — LEVOCETIRIZINE DIHYDROCHLORIDE 5 MG PO TABS: 5.0000 mg | ORAL_TABLET | Freq: Every evening | ORAL | 0 refills | Status: DC

## 2024-02-09 MED ORDER — FLUTICASONE PROPIONATE 50 MCG/ACT NA SUSP: 2.0000 | Freq: Every day | NASAL | 0 refills | Status: DC

## 2024-02-09 NOTE — Telephone Encounter (Signed)
 Pt called in stating she left her levothroxin in another state while on a trip. Pt is asking that a prescription be sent to her pharmacy. Please follow up with the patient.

## 2024-02-09 NOTE — Progress Notes (Signed)
 E visit for Allergic Rhinitis We are sorry that you are not feeling well.  Here is how we plan to help!  Based on what you have shared with me it looks like you have Allergic Rhinitis.  Rhinitis is when a reaction occurs that causes nasal congestion, runny nose, sneezing, and itching.  Most types of rhinitis are caused by an inflammation and are associated with symptoms in the eyes ears or throat. There are several types of rhinitis.  The most common are acute rhinitis, which is usually caused by a viral illness, allergic or seasonal rhinitis, and nonallergic or year-round rhinitis.  Nasal allergies occur certain times of the year.  Allergic rhinitis is caused when allergens in the air trigger the release of histamine in the body.  Histamine causes itching, swelling, and fluid to build up in the fragile linings of the nasal passages, sinuses and eyelids.  An itchy nose and clear discharge are common.  Switch to these two medications.  I recommend the following over the counter treatments: Xyzal 5 mg take 1 tablet daily  I also would recommend a nasal spray: Flonase 2 sprays into each nostril once daily and Saline 1 spray into each nostril as needed  HOME CARE:  You can use an over-the-counter saline nasal spray as needed Avoid areas where there is heavy dust, mites, or molds Stay indoors on windy days during the pollen season Keep windows closed in home, at least in bedroom; use air conditioner. Use high-efficiency house air filter Keep windows closed in car, turn AC on re-circulate Avoid playing out with dog during pollen season  GET HELP RIGHT AWAY IF:  If your symptoms do not improve within 10 days You become short of breath You develop yellow or green discharge from your nose for over 3 days You have coughing fits  MAKE SURE YOU:  Understand these instructions Will watch your condition Will get help right away if you are not doing well or get worse  Thank you for choosing an  e-visit. Your e-visit answers were reviewed by a board certified advanced clinical practitioner to complete your personal care plan. Depending upon the condition, your plan could have included both over the counter or prescription medications. Please review your pharmacy choice. Be sure that the pharmacy you have chosen is open so that you can pick up your prescription now.  If there is a problem you may message your provider in MyChart to have the prescription routed to another pharmacy. Your safety is important to us . If you have drug allergies check your prescription carefully.  For the next 24 hours, you can use MyChart to ask questions about today's visit, request a non-urgent call back, or ask for a work or school excuse from your e-visit provider. You will get an email in the next two days asking about your experience. I hope that your e-visit has been valuable and will speed your recovery.       I provided 5 minutes of non face-to-face time during this encounter for chart review, medication and order placement, as well as and documentation.

## 2024-02-14 NOTE — Assessment & Plan Note (Signed)
 Occasional swelling of lower extremities bilaterally.  Patient needs refill of lasix  today.  Will follow up on symptom control at next appointment.

## 2024-02-14 NOTE — Assessment & Plan Note (Signed)
 On Rx Vitamin D .  No nausea, vomiting or muscle weakness.  Needs a refill of Vitamin D  today.

## 2024-02-14 NOTE — Assessment & Plan Note (Signed)
 Last A1c of 6.1 last year.  Labs not ordered today.  Will see if patient can get back on tirzepatide to help with better control of simple carbohydrate intake.

## 2024-02-14 NOTE — Assessment & Plan Note (Signed)
 Anthropometric Measurements Height: 5\' 8"  (1.727 m) Weight: 288 lb (130.6 kg) BMI (Calculated): 43.8 Weight at Last Visit: 274 lb Weight Lost Since Last Visit: 0 Weight Gained Since Last Visit: 14 lb Starting Weight: 305 lb Total Weight Loss (lbs): 17 lb (7.711 kg) Body Composition  Body Fat %: 43.7 % Fat Mass (lbs): 126.2 lbs Muscle Mass (lbs): 154.4 lbs Visceral Fat Rating : 13 Other Clinical Data Fasting: No Labs: No Today's Visit #: 71 Starting Date: 04/18/19

## 2024-02-24 ENCOUNTER — Other Ambulatory Visit (INDEPENDENT_AMBULATORY_CARE_PROVIDER_SITE_OTHER): Payer: Self-pay | Admitting: Family Medicine

## 2024-03-15 ENCOUNTER — Ambulatory Visit (INDEPENDENT_AMBULATORY_CARE_PROVIDER_SITE_OTHER): Admitting: Family Medicine

## 2024-03-15 ENCOUNTER — Encounter (INDEPENDENT_AMBULATORY_CARE_PROVIDER_SITE_OTHER): Payer: Self-pay | Admitting: Family Medicine

## 2024-03-15 VITALS — BP 128/81 | HR 66 | Temp 97.9°F | Ht 68.0 in | Wt 273.0 lb

## 2024-03-15 DIAGNOSIS — Z6841 Body Mass Index (BMI) 40.0 and over, adult: Secondary | ICD-10-CM | POA: Diagnosis not present

## 2024-03-15 DIAGNOSIS — M25512 Pain in left shoulder: Secondary | ICD-10-CM | POA: Diagnosis not present

## 2024-03-15 DIAGNOSIS — R7303 Prediabetes: Secondary | ICD-10-CM | POA: Diagnosis not present

## 2024-03-15 DIAGNOSIS — E669 Obesity, unspecified: Secondary | ICD-10-CM | POA: Diagnosis not present

## 2024-03-15 MED ORDER — METFORMIN HCL 500 MG PO TABS
500.0000 mg | ORAL_TABLET | Freq: Every day | ORAL | 0 refills | Status: DC
Start: 2024-03-15 — End: 2024-07-20

## 2024-03-15 MED ORDER — TIRZEPATIDE-WEIGHT MANAGEMENT 5 MG/0.5ML ~~LOC~~ SOLN: 5.0000 mg | SUBCUTANEOUS | 0 refills | Status: DC

## 2024-03-15 NOTE — Progress Notes (Signed)
 SUBJECTIVE:  Chief Complaint: Obesity  Interim History: Since last appointment she went on a cruise and was worried about the nausea from zepbound  so she didn't start that until she got back.  She went and camped last week and was doing a lot of uphill walking.  She is still figuring out the zepbound  and is trying to get to a point when she knows she has to stop eating.  She is still feeling more hunger at night.  Planning a cookout for July 4th and is anticipating doing a few hamburger patties.  No side effects on zepbound  but interested in increasing dose.  Isabella Larson is here to discuss her progress with her obesity treatment plan. She is on the Category 4 Plan and states she is following her eating plan approximately 70 % of the time. She states she is not exercising.   OBJECTIVE: Visit Diagnoses: Problem List Items Addressed This Visit       Other   Prediabetes - Primary   Tolerating metformin  with no GI side effects- needs a refill today at current dose      Relevant Medications   metFORMIN  (GLUCOPHAGE ) 500 MG tablet   Obesity with starting BMI of 46.3   Anthropometric Measurements Height: 5' 8 (1.727 m) Weight: 273 lb (123.8 kg) BMI (Calculated): 41.52 Weight at Last Visit: 288 lb Weight Lost Since Last Visit: 15 lb Weight Gained Since Last Visit: 0 Starting Weight: 305 lb Total Weight Loss (lbs): 32 lb (14.5 kg) Peak Weight: 312 lb Body Composition  Body Fat %: 50 % Fat Mass (lbs): 137 lbs Muscle Mass (lbs): 130 lbs Visceral Fat Rating : 14 Other Clinical Data Fasting: no Labs: no Today's Visit #: 72 Starting Date: 04/18/19  Now on zepbound - need refill today.  No significant GI side effects       Relevant Medications   tirzepatide  5 MG/0.5ML injection vial   metFORMIN  (GLUCOPHAGE ) 500 MG tablet   BMI 40.0-44.9, adult Current BMI 42.1   Relevant Medications   tirzepatide  5 MG/0.5ML injection vial   metFORMIN  (GLUCOPHAGE ) 500 MG tablet   Acute pain of  left shoulder   Recent increase in pain- no significant trauma to the joint.  Will continue monitor- supportive treatment advice given.  Patient has to limit use of NSAIDs due to bariatric surgery       No data recorded       03/15/2024    2:00 PM 02/04/2024    3:00 PM 12/30/2023    2:00 PM  Vitals with BMI  Height 5' 8 5' 8 5' 8  Weight 273 lbs 288 lbs 274 lbs  BMI 41.52 43.8 41.67  Systolic 128 119 876  Diastolic 81 72 83  Pulse 66 79 68      ASSESSMENT AND PLAN:  Diet: Isabella Larson is currently in the action stage of change. As such, her goal is to continue with weight loss efforts and has agreed to the Category 4 Plan.   Exercise:  All adults should avoid inactivity. Some activity is better than none, and adults who participate in any amount of physical activity, gain some health benefits.  Behavior Modification:  We discussed the following Behavioral Modification Strategies today: increasing lean protein intake, decreasing simple carbohydrates, increasing vegetables, meal planning and cooking strategies, keeping healthy foods in the home, and planning for success. We discussed various medication options to help Isabella Larson with her weight loss efforts and we both agreed to increase zepbound  to 5mg  weekly.  Return  in about 3 weeks (around 04/05/2024).   She was informed of the importance of frequent follow up visits to maximize her success with intensive lifestyle modifications for her multiple health conditions.  Attestation Statements:   Reviewed by clinician on day of visit: allergies, medications, problem list, medical history, surgical history, family history, social history, and previous encounter notes.     Isabella Cho, MD

## 2024-03-16 ENCOUNTER — Encounter: Payer: Self-pay | Admitting: Physician Assistant

## 2024-03-16 ENCOUNTER — Ambulatory Visit: Admitting: Physician Assistant

## 2024-03-16 DIAGNOSIS — M25512 Pain in left shoulder: Secondary | ICD-10-CM | POA: Diagnosis not present

## 2024-03-16 MED ORDER — METHYLPREDNISOLONE ACETATE 40 MG/ML IJ SUSP
40.0000 mg | INTRAMUSCULAR | Status: AC | PRN
  Administered 2024-03-16: 40 mg via INTRA_ARTICULAR

## 2024-03-16 MED ORDER — LIDOCAINE HCL 1 % IJ SOLN
5.0000 mL | INTRAMUSCULAR | Status: AC | PRN
  Administered 2024-03-16: 5 mL

## 2024-03-16 NOTE — Progress Notes (Signed)
 Office Visit Note   Patient: Isabella Larson           Date of Birth: 05-Nov-1980           MRN: [id number] Visit Date: 03/16/2024              Requested by: Arloa Elsie SAUNDERS, MD [phone] [address] [place],  [place] [address] PCP: Arloa Elsie SAUNDERS, MD      HPI: Patient is a 43 year old woman who is a patient of [facility].  She has a history of rotator cuff tendinitis.  She is does very well with injections.  Last injection was in January.  Requesting a another injection today.  No new injury.  Assessment & Plan: Visit Diagnoses:  1. Acute pain of left shoulder     Plan: Went forward with left shoulder injection today without difficulty.  May follow-up with Marvis or myself as needed  Follow-Up Instructions: Return if symptoms worsen or fail to improve.   Ortho Exam  Patient is alert, oriented, no adenopathy, well-dressed, normal affect, normal respiratory effort. Left shoulder she has pain especially with internal rotation behind her back she has good forward elevation abductor strength is intact she does have a positive empty can test she is neurovascular intact    Imaging: No results found. No images are attached to the encounter.  Labs: Lab Results  Component Value Date   HGBA1C 5.7 (H) 10/01/2023   HGBA1C 6.1 (H) 03/09/2023   HGBA1C 5.8 (H) 09/11/2022     Lab Results  Component Value Date   ALBUMIN 3.9 10/01/2023   ALBUMIN 4.0 03/09/2023   ALBUMIN 4.2 09/11/2022    No results found for: MG Lab Results  Component Value Date   VD25OH 56.3 10/01/2023   VD25OH 37.0 03/09/2023   VD25OH 61.1 09/11/2022    No results found for: PREALBUMIN    Latest Ref Rng & Units 10/01/2023    8:20 AM 03/09/2023    8:34 AM 09/11/2022    8:03 AM  CBC EXTENDED  WBC 3.4 - 10.8 x10E3/uL 7.1  6.7  5.1   RBC 3.77 - 5.28 x10E6/uL 4.00  4.18  4.61   Hemoglobin 11.1 - 15.9 g/dL 88.5  88.5  88.3   HCT 34.0 - 46.6 % 35.1  36.3  38.0   Platelets 150 - 450  x10E3/uL 275  236  297   NEUT# 1.4 - 7.0 x10E3/uL 4.7   2.9   Lymph# 0.7 - 3.1 x10E3/uL 1.7   1.7      There is no height or weight on file to calculate BMI.  Orders:  No orders of the defined types were placed in this encounter.  No orders of the defined types were placed in this encounter.    Procedures: Large Joint Inj: L subacromial bursa on 03/16/2024 9:13 AM Indications: diagnostic evaluation and pain Details: 25 G 1.5 in needle, posterior approach  Arthrogram: No  Medications: 5 mL lidocaine  1 %; 40 mg methylPREDNISolone  acetate 40 MG/ML Outcome: tolerated well, no immediate complications Procedure, treatment alternatives, risks and benefits explained, specific risks discussed. Consent was given by the patient.    Clinical Data: No additional findings.  ROS:  All other systems negative, except as noted in the HPI. Review of Systems  Objective: Vital Signs: There were no vitals taken for this visit.  Specialty Comments:  No specialty comments available.  PMFS History: Patient Active Problem List   Diagnosis Date Noted  . Acute  pain of left shoulder 03/15/2024  . At high risk for altered family dynamics 07/28/2023  . Polyphagia 12/25/2022  . Obesity with starting BMI of 46.3 12/25/2022  . BMI 40.0-44.9, adult Current BMI 42.1 12/25/2022  . Bronchitis 06/26/2022  . Edema of both lower extremities 06/02/2022  . Absolute anemia 02/19/2022  . Other specified hypothyroidism- post-surgical  02/10/2022  . Vitamin D  deficiency 02/10/2022  . Class 3 severe obesity with serious comorbidity and body mass index (BMI) 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.9 in adult 12/01/2019  . 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 Prediabetes 04/19/2019  . 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 B12 deficiency 09/13/2013  . H/O gastric bypass 09/13/2013   Past Medical History:  Diagnosis Date  . Anemia   . Back pain   .  Edema of both feet   . Hypothyroidism   . Vitamin B 12 deficiency   . Vitamin D  deficiency     Family History  Problem Relation Age of Onset  . High Cholesterol Mother   . Cancer Mother   . Sleep apnea Mother   . Obesity Mother   . Obesity Father   . Sleep apnea Father   . High Cholesterol Father     Past Surgical History:  Procedure Laterality Date  . mini gastric bypass     Social History   Occupational History  . Occupation: Therapist, sports  Tobacco Use  . Smoking status: Never  . Smokeless tobacco: Never  Substance and Sexual Activity  . Alcohol use: Not on file  . Drug use: Not on file  . Sexual activity: Not on file

## 2024-03-26 NOTE — Assessment & Plan Note (Addendum)
 Anthropometric Measurements Height: 5' 8 (1.727 m) Weight: 273 lb (123.8 kg) BMI (Calculated): 41.52 Weight at Last Visit: 288 lb Weight Lost Since Last Visit: 15 lb Weight Gained Since Last Visit: 0 Starting Weight: 305 lb Total Weight Loss (lbs): 32 lb (14.5 kg) Peak Weight: 312 lb Body Composition  Body Fat %: 50 % Fat Mass (lbs): 137 lbs Muscle Mass (lbs): 130 lbs Visceral Fat Rating : 14 Other Clinical Data Fasting: no Labs: no Today's Visit #: 72 Starting Date: 04/18/19  Now on zepbound - need refill today.  No significant GI side effects

## 2024-03-26 NOTE — Assessment & Plan Note (Signed)
 Recent increase in pain- no significant trauma to the joint.  Will continue monitor- supportive treatment advice given.  Patient has to limit use of NSAIDs due to bariatric surgery

## 2024-03-26 NOTE — Assessment & Plan Note (Signed)
 Tolerating metformin  with no GI side effects- needs a refill today at current dose

## 2024-03-28 ENCOUNTER — Other Ambulatory Visit (INDEPENDENT_AMBULATORY_CARE_PROVIDER_SITE_OTHER): Payer: Self-pay | Admitting: Family Medicine

## 2024-04-05 ENCOUNTER — Other Ambulatory Visit (INDEPENDENT_AMBULATORY_CARE_PROVIDER_SITE_OTHER): Payer: Self-pay | Admitting: Family Medicine

## 2024-04-05 DIAGNOSIS — R600 Localized edema: Secondary | ICD-10-CM

## 2024-04-06 ENCOUNTER — Encounter (INDEPENDENT_AMBULATORY_CARE_PROVIDER_SITE_OTHER): Payer: Self-pay | Admitting: Family Medicine

## 2024-04-06 ENCOUNTER — Ambulatory Visit (INDEPENDENT_AMBULATORY_CARE_PROVIDER_SITE_OTHER): Admitting: Family Medicine

## 2024-04-06 DIAGNOSIS — E559 Vitamin D deficiency, unspecified: Secondary | ICD-10-CM

## 2024-04-06 DIAGNOSIS — R600 Localized edema: Secondary | ICD-10-CM | POA: Diagnosis not present

## 2024-04-06 DIAGNOSIS — Z6841 Body Mass Index (BMI) 40.0 and over, adult: Secondary | ICD-10-CM | POA: Diagnosis not present

## 2024-04-06 MED ORDER — TIRZEPATIDE-WEIGHT MANAGEMENT 5 MG/0.5ML ~~LOC~~ SOLN: 5.0000 mg | SUBCUTANEOUS | 0 refills | Status: DC

## 2024-04-06 MED ORDER — VITAMIN D (ERGOCALCIFEROL) 1.25 MG (50000 UNIT) PO CAPS: 50000.0000 [IU] | ORAL_CAPSULE | ORAL | 0 refills | Status: DC

## 2024-04-06 MED ORDER — FUROSEMIDE 20 MG PO TABS: 20.0000 mg | ORAL_TABLET | Freq: Every day | ORAL | 0 refills | Status: AC

## 2024-04-06 NOTE — Progress Notes (Signed)
 SUBJECTIVE:  Chief Complaint: Obesity  Interim History: Patient voices she is not hungry at all in the am.  She is trying to eat some protein during the day and she is getting hungrier in the evening but [place] She is home for a while now and mentions she can get herself refocused on what she is taking in.  She has started exercising.She ordered some kettlebells on prime days.  Isabella Larson is here to discuss her progress with her obesity treatment plan. She is on the Category 4 Plan and states she is following her eating plan approximately 70 % of the time. She states she is exercising 40 minutes 5 times per week.   OBJECTIVE: Visit Diagnoses: Problem List Items Addressed This Visit       Other   Vitamin D  deficiency   Relevant Medications   Vitamin D , Ergocalciferol , (DRISDOL ) 1.25 MG (50000 UNIT) CAPS capsule   Edema of both lower extremities   Relevant Medications   furosemide  (LASIX ) 20 MG tablet   Obesity with starting BMI of 46.3 - Primary   Relevant Medications   tirzepatide  5 MG/0.5ML injection vial    No data recorded      04/06/2024   10:00 AM 03/15/2024    2:00 PM 02/04/2024    3:00 PM  Vitals with BMI  Height 5' 8 5' 8 5' 8  Weight 272 lbs 273 lbs 288 lbs  BMI 41.37 41.52 43.8  Systolic 113 128 880  Diastolic 76 81 72  Pulse 66 66 79       ASSESSMENT AND PLAN: Assessment & Plan Vitamin D  deficiency On prescription strength Vitamin D .  Needs a refill of Vitamin D  today.  No nausea, vomiting or muscle weakness mentioned. Edema of both lower extremities Doing well on lasix  daily.  Needs a refill of lasix  today as this is managing symptoms well. Morbid obesity (HCC) Anthropometric Measurements Height: 5' 8 (1.727 m) Weight: 272 lb (123.4 kg) BMI (Calculated): 41.37 Weight at Last Visit: 273 lb Weight Lost Since Last Visit: 1 Starting Weight: 305 lb Total Weight Loss (lbs): 33 lb (15 kg) Peak Weight: 312  lb Body Composition  Body Fat %: 51.2 % Fat Mass (lbs): 139.6 lbs Muscle Mass (lbs): 126.2 lbs Visceral Fat Rating : 14 Other Clinical Data Today's Visit #: 47 Starting Date: 04/18/19 Comments: Cat 4    Diet: Ardel is currently in the action stage of change. As such, her goal is to continue with weight loss efforts and has agreed to the Category 4 Plan.   Exercise:  For substantial health benefits, adults should do at least 150 minutes (2 hours and 30 minutes) a week of moderate-intensity, or 75 minutes (1 hour and 15 minutes) a week of vigorous-intensity aerobic physical activity, or an equivalent combination of moderate- and vigorous-intensity aerobic activity. Aerobic activity should be performed in episodes of at least 10 minutes, and preferably, it should be spread throughout the week.  Behavior Modification:  We discussed the following Behavioral Modification Strategies today: increasing lean protein intake, decreasing simple carbohydrates, increasing vegetables, meal planning and cooking strategies, and planning for success. We discussed various medication options to help [name] with her weight loss efforts and we both agreed to continue zepbound  at current dose.  No follow-ups on file.   She was informed of the importance of frequent follow up visits to maximize her success with intensive lifestyle modifications for her multiple health conditions.  Attestation Statements:   Reviewed by clinician on day of visit: allergies, medications, problem list, medical history, surgical history, family history, social history, and previous encounter notes.     [name], MD

## 2024-04-17 NOTE — Assessment & Plan Note (Signed)
 Doing well on lasix  daily.  Needs a refill of lasix  today as this is managing symptoms well.

## 2024-04-17 NOTE — Assessment & Plan Note (Signed)
 On prescription strength Vitamin D .  Needs a refill of Vitamin D  today.  No nausea, vomiting or muscle weakness mentioned.

## 2024-04-17 NOTE — Assessment & Plan Note (Signed)
 Anthropometric Measurements Height: 5' 8 (1.727 m) Weight: 272 lb (123.4 kg) BMI (Calculated): 41.37 Weight at Last Visit: 273 lb Weight Lost Since Last Visit: 1 Starting Weight: 305 lb Total Weight Loss (lbs): 33 lb (15 kg) Peak Weight: 312 lb Body Composition  Body Fat %: 51.2 % Fat Mass (lbs): 139.6 lbs Muscle Mass (lbs): 126.2 lbs Visceral Fat Rating : 14 Other Clinical Data Today's Visit #: 77 Starting Date: 04/18/19 Comments: Cat 4

## 2024-04-28 ENCOUNTER — Other Ambulatory Visit (INDEPENDENT_AMBULATORY_CARE_PROVIDER_SITE_OTHER): Payer: Self-pay | Admitting: Family Medicine

## 2024-05-05 ENCOUNTER — Encounter (INDEPENDENT_AMBULATORY_CARE_PROVIDER_SITE_OTHER): Payer: Self-pay | Admitting: Family Medicine

## 2024-05-05 ENCOUNTER — Ambulatory Visit (INDEPENDENT_AMBULATORY_CARE_PROVIDER_SITE_OTHER): Admitting: Family Medicine

## 2024-05-05 VITALS — BP 116/74 | HR 67 | Temp 97.9°F | Ht 68.0 in | Wt 265.0 lb

## 2024-05-05 DIAGNOSIS — E038 Other specified hypothyroidism: Secondary | ICD-10-CM | POA: Diagnosis not present

## 2024-05-05 DIAGNOSIS — E559 Vitamin D deficiency, unspecified: Secondary | ICD-10-CM

## 2024-05-05 DIAGNOSIS — Z6841 Body Mass Index (BMI) 40.0 and over, adult: Secondary | ICD-10-CM

## 2024-05-05 MED ORDER — TIRZEPATIDE-WEIGHT MANAGEMENT 5 MG/0.5ML ~~LOC~~ SOLN: 5.0000 mg | SUBCUTANEOUS | 0 refills | Status: DC

## 2024-05-05 MED ORDER — LEVOTHYROXINE SODIUM 200 MCG PO TABS
200.0000 ug | ORAL_TABLET | Freq: Every day | ORAL | 0 refills | Status: DC
Start: 2024-05-05 — End: 2024-08-17

## 2024-05-05 MED ORDER — VITAMIN D (ERGOCALCIFEROL) 1.25 MG (50000 UNIT) PO CAPS: 50000.0000 [IU] | ORAL_CAPSULE | ORAL | 0 refills | Status: DC

## 2024-05-05 NOTE — Progress Notes (Signed)
 SUBJECTIVE:  Chief Complaint: Obesity  Interim History: Patient is working very hard and committing to the gym and is enjoying it.  She makes a plan to go and feels disappointed if she doesn't get to go to the gym. Still struggling with getting all food in and has ready made [place] and cheese roll ups on hand.  She has oats on hand and is has protein balls made from these.  She feels satisfied and some hunger but its not overwhelming.  School is starting next week and she is going to [employer] with her mother or sister's mother in Social worker.  She is trying to figure out how to get exercise in with having to change time from the morning to the afternoon.   Reba is here to discuss her progress with her obesity treatment plan. She is on the Category 4 Plan and states she is following her eating plan approximately 60 % of the time. She states she is exercising 45 minutes 4-5 times per week.   OBJECTIVE: Visit Diagnoses: Problem List Items Addressed This Visit       Endocrine   Other specified hypothyroidism- post-surgical  - Primary (Chronic)   Relevant Medications   levothyroxine  (SYNTHROID ) 200 MCG tablet     Other   Vitamin D  deficiency   Relevant Medications   Vitamin D , Ergocalciferol , (DRISDOL ) 1.25 MG (50000 UNIT) CAPS capsule   Obesity with starting BMI of 46.3   Relevant Medications   tirzepatide  5 MG/0.5ML injection vial   BMI 40.0-44.9, adult Current BMI 42.1   Relevant Medications   tirzepatide  5 MG/0.5ML injection vial   Other Visit Diagnoses       Obesity with current BMI of 42.4       Relevant Medications   tirzepatide  5 MG/0.5ML injection vial       Vitals Temp: 97.9 F (36.6 C) BP: 116/74 Pulse Rate: 67 SpO2: 100 %   Anthropometric Measurements Height: 5' 8 (1.727 m) Weight: 265 lb (120.2 kg) BMI (Calculated): 40.3 Weight at Last Visit: 272 lb Weight Lost Since Last Visit: 7 Weight Gained Since Last Visit: 0 Starting Weight: 305 lb Total  Weight Loss (lbs): 40 lb (18.1 kg)   Body Composition  Body Fat %: 49 % Fat Mass (lbs): 130 lbs Muscle Mass (lbs): 128.4 lbs Total Body Water (lbs): 103.8 lbs Visceral Fat Rating : 13   Other Clinical Data Today's Visit #: 31 Starting Date: 04/18/19 Comments: Cat 4     ASSESSMENT AND PLAN: Assessment & Plan Other specified hypothyroidism Refill of levothyroxine  200 mcg sent into pharmacy today.  Patient denies cold intolerance, heat intolerance, or palpitations.  Most recent TSH done by our office was in January of this year and was within normal range at 4.29.  Will need repeat labs done in the next 1 to 2 months. Vitamin D  deficiency Last vitamin D  level done in January of this year.  Patient has done well on prescription strength supplementation.  Needs refill today.  Continue current treatment. Morbid obesity (HCC) Patient is tolerating 5 mg tirzepatide  and needs a refill of that today.  Feels as though she has significantly increased control when it comes to dietary intake in terms of choices and quantity.  No significant GI side effects mentioned. BMI 40.0-44.9, adult (HCC)  Obesity with current BMI of 42.4    Diet: Sanyah is currently in the action stage of change. As such, her goal is to continue with weight loss efforts and has  agreed to the Category 4 Plan.   Exercise:  For substantial health benefits, adults should do at least 150 minutes (2 hours and 30 minutes) a week of moderate-intensity, or 75 minutes (1 hour and 15 minutes) a week of vigorous-intensity aerobic physical activity, or an equivalent combination of moderate- and vigorous-intensity aerobic activity. Aerobic activity should be performed in episodes of at least 10 minutes, and preferably, it should be spread throughout the week.  Behavior Modification:  We discussed the following Behavioral Modification Strategies today: increasing lean protein intake, decreasing simple carbohydrates, increasing  vegetables, meal planning and cooking strategies, and planning for success. We discussed various medication options to help Evelyne with her weight loss efforts and we both agreed to continue tirzepatide  at current dose.  Return in about 5 weeks (around 06/09/2024).   She was informed of the importance of frequent follow up visits to maximize her success with intensive lifestyle modifications for her multiple health conditions.  Attestation Statements:   Reviewed by clinician on day of visit: allergies, medications, problem list, medical history, surgical history, family history, social history, and previous encounter notes.     [name], MD

## 2024-05-15 NOTE — Assessment & Plan Note (Signed)
 Patient is tolerating 5 mg tirzepatide  and needs a refill of that today.  Feels as though she has significantly increased control when it comes to dietary intake in terms of choices and quantity.  No significant GI side effects mentioned.

## 2024-05-15 NOTE — Assessment & Plan Note (Signed)
 Refill of levothyroxine  200 mcg sent into pharmacy today.  Patient denies cold intolerance, heat intolerance, or palpitations.  Most recent TSH done by our office was in January of this year and was within normal range at 4.29.  Will need repeat labs done in the next 1 to 2 months.

## 2024-05-15 NOTE — Assessment & Plan Note (Signed)
 Last vitamin D  level done in January of this year.  Patient has done well on prescription strength supplementation.  Needs refill today.  Continue current treatment.

## 2024-06-09 ENCOUNTER — Ambulatory Visit (INDEPENDENT_AMBULATORY_CARE_PROVIDER_SITE_OTHER): Admitting: Family Medicine

## 2024-06-11 ENCOUNTER — Other Ambulatory Visit (INDEPENDENT_AMBULATORY_CARE_PROVIDER_SITE_OTHER): Payer: Self-pay | Admitting: Family Medicine

## 2024-06-11 DIAGNOSIS — R7303 Prediabetes: Secondary | ICD-10-CM

## 2024-06-14 ENCOUNTER — Ambulatory Visit (INDEPENDENT_AMBULATORY_CARE_PROVIDER_SITE_OTHER): Admitting: Family Medicine

## 2024-06-14 VITALS — BP 111/73 | HR 68 | Temp 97.8°F | Ht 68.0 in | Wt 264.0 lb

## 2024-06-14 DIAGNOSIS — K9189 Other postprocedural complications and disorders of digestive system: Secondary | ICD-10-CM

## 2024-06-14 DIAGNOSIS — Z6841 Body Mass Index (BMI) 40.0 and over, adult: Secondary | ICD-10-CM

## 2024-06-14 DIAGNOSIS — R7303 Prediabetes: Secondary | ICD-10-CM | POA: Diagnosis not present

## 2024-06-14 DIAGNOSIS — D508 Other iron deficiency anemias: Secondary | ICD-10-CM

## 2024-06-14 DIAGNOSIS — E7849 Other hyperlipidemia: Secondary | ICD-10-CM

## 2024-06-14 DIAGNOSIS — E559 Vitamin D deficiency, unspecified: Secondary | ICD-10-CM | POA: Diagnosis not present

## 2024-06-14 MED ORDER — TIRZEPATIDE-WEIGHT MANAGEMENT 5 MG/0.5ML ~~LOC~~ SOLN
5.0000 mg | SUBCUTANEOUS | 0 refills | Status: DC
Start: 2024-06-14 — End: 2024-06-20

## 2024-06-14 MED ORDER — FUSION PLUS PO CAPS: 1.0000 | ORAL_CAPSULE | Freq: Every day | ORAL | 0 refills | Status: DC

## 2024-06-14 NOTE — Progress Notes (Signed)
 SUBJECTIVE:  Chief Complaint: Obesity  Interim History: Patient feels she is experiencing some constipation on Zepbound .  She is trying to increase her fiber and occassionally adds in ducolax.  She is doing 4-5 days a week of exercise.  She is walking at home and will do supplemental resistance at the gym.  She hasn't exercised in the last 4 days due to committing her grandmother to memory care. She has been mass prepping meals in order to get more consistent meals in that have slightly higher carbs but also more protein in.  She is not really hungry in the am but is trying to have healthier options to eat.  Has yet to find a greek yogurt bar she likes.  She does think there will be a few activities but no significant travel.    Isabella Larson is here to discuss her progress with her obesity treatment plan. She is on the Category 4 Plan and states she is following her eating plan approximately 80 % of the time. She states she is exercising 45  minutes 4-5 times per week.   OBJECTIVE: Visit Diagnoses: Problem List Items Addressed This Visit   None   Vitals Temp: 97.8 F (36.6 C) BP: 111/73 Pulse Rate: 68 SpO2: 100 %   Anthropometric Measurements Height: 5' 8 (1.727 m) Weight: 264 lb (119.7 kg) BMI (Calculated): 40.15 Weight at Last Visit: 265 lb Weight Lost Since Last Visit: 1 Weight Gained Since Last Visit: 0 Starting Weight: 305 lb Total Weight Loss (lbs): 41 lb (18.6 kg)   Body Composition  Body Fat %: 49.8 % Fat Mass (lbs): 131.8 lbs Muscle Mass (lbs): 126.2 lbs Visceral Fat Rating : 14   Other Clinical Data Today's Visit #: 85 Starting Date: 04/18/19 Comments: Cat 4     ASSESSMENT AND PLAN: Assessment & Plan Prediabetes Working on limiting simple carbohydrates daily as well as increasing total nutrition in the form of protein intake.  Patient is currently on pharmacotherapy to help with obesity but also will help control prediabetes.  Will hold metformin  in  order CMP, A1c and insulin  level today. Vitamin D  deficiency On prescription strength vitamin D  but still not at goal.  Will repeat vitamin D  level today to assess if patient needs a different formulation. 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  Will order CBC and anemia panel to assess patient's anemia status after iron  supplementation with fusion plus. Other hyperlipidemia Fasting for labs today will repeat fasting lipid panel and plan to risk stratify patient at next appointment with current lab values. Morbid obesity (HCC) Patient is tolerating tirzepatide  5 mg subcutaneous injection weekly.  She is still working on increasing her total p.o. intake every day.  Will continue current dose for now until patient is able to meet nutrition goals. BMI 40.0-44.9, adult (HCC)    Diet: Isabella Larson is currently in the action stage of change. As such, her goal is to continue with weight loss efforts and has agreed to the Category 4 Plan.   Exercise:  For substantial health benefits, adults should do at least 150 minutes (2 hours and 30 minutes) a week of moderate-intensity, or 75 minutes (1 hour and 15 minutes) a week of vigorous-intensity aerobic physical activity, or an equivalent combination of moderate- and vigorous-intensity aerobic activity. Aerobic activity should be performed in episodes of at least 10 minutes, and preferably, it should be spread throughout the week.  Behavior Modification:  We discussed the following Behavioral Modification Strategies today:  increasing lean protein intake, decreasing simple carbohydrates, increasing vegetables, meal planning and cooking strategies, and planning for success. We discussed various medication options to help Isabella Larson with her weight loss efforts and we both agreed to continue tirzepatide  5 mg subcutaneous weekly..  No follow-ups on file.   She was informed of the importance of frequent follow up visits to maximize  her success with intensive lifestyle modifications for her multiple health conditions.  Attestation Statements:   Reviewed by clinician on day of visit: allergies, medications, problem list, medical history, surgical history, family history, social history, and previous encounter notes.     Isabella Cho, MD

## 2024-06-16 LAB — LIPID PANEL WITH LDL/HDL RATIO
Cholesterol, Total: 183 mg/dL (ref 100–199)
HDL: 62 mg/dL (ref >39)
LDL Chol Calc (NIH): 107 mg/dL — ABNORMAL HIGH (ref 0–99)
LDL/HDL Ratio: 1.7 ratio (ref 0.0–3.2)
Triglycerides: 74 mg/dL (ref 0–149)
VLDL Cholesterol Cal: 14 mg/dL (ref 5–40)

## 2024-06-16 LAB — CBC WITH DIFFERENTIAL/PLATELET
Basophils Absolute: 0 x10E3/uL (ref 0.0–0.2)
Basos: 1 %
EOS (ABSOLUTE): 0.1 x10E3/uL (ref 0.0–0.4)
Eos: 1 %
Hemoglobin: 12.4 g/dL (ref 11.1–15.9)
Immature Grans (Abs): 0 x10E3/uL (ref 0.0–0.1)
Immature Granulocytes: 0 %
Lymphocytes Absolute: 1.9 x10E3/uL (ref 0.7–3.1)
Lymphs: 31 %
MCH: 28.6 pg (ref 26.6–33.0)
MCHC: 31.2 g/dL — ABNORMAL LOW (ref 31.5–35.7)
MCV: 92 fL (ref 79–97)
Monocytes Absolute: 0.5 x10E3/uL (ref 0.1–0.9)
Monocytes: 8 %
Neutrophils Absolute: 3.6 x10E3/uL (ref 1.4–7.0)
Neutrophils: 59 %
Platelets: 256 x10E3/uL (ref 150–450)
RBC: 4.33 x10E6/uL (ref 3.77–5.28)
RDW: 12.5 % (ref 11.7–15.4)
WBC: 6 x10E3/uL (ref 3.4–10.8)

## 2024-06-16 LAB — HEMOGLOBIN A1C
Est. average glucose Bld gHb Est-mCnc: 105 mg/dL
Hgb A1c MFr Bld: 5.3 % (ref 4.8–5.6)

## 2024-06-16 LAB — ANEMIA PANEL
Ferritin: 49 ng/mL (ref 15–150)
Folate, Hemolysate: 452 ng/mL
Folate, RBC: 1139 ng/mL (ref >498)
Hematocrit: 39.7 % (ref 34.0–46.6)
Iron Saturation: 30 % (ref 15–55)
Iron: 93 ug/dL (ref 27–159)
Retic Ct Pct: 1 % (ref 0.6–2.6)
Total Iron Binding Capacity: 311 ug/dL (ref 250–450)
UIBC: 218 ug/dL (ref 131–425)
Vitamin B-12: 1079 pg/mL (ref 232–1245)

## 2024-06-16 LAB — COMPREHENSIVE METABOLIC PANEL WITH GFR
ALT: 18 IU/L (ref 0–32)
AST: 20 IU/L (ref 0–40)
Albumin: 4.3 g/dL (ref 3.9–4.9)
Alkaline Phosphatase: 95 IU/L (ref 41–116)
BUN/Creatinine Ratio: 21 (ref 9–23)
BUN: 14 mg/dL (ref 6–24)
Bilirubin Total: 0.3 mg/dL (ref 0.0–1.2)
CO2: 21 mmol/L (ref 20–29)
Calcium: 9.1 mg/dL (ref 8.7–10.2)
Chloride: 106 mmol/L (ref 96–106)
Creatinine, Ser: 0.68 mg/dL (ref 0.57–1.00)
Globulin, Total: 2.6 g/dL (ref 1.5–4.5)
Glucose: 88 mg/dL (ref 70–99)
Potassium: 4.5 mmol/L (ref 3.5–5.2)
Sodium: 141 mmol/L (ref 134–144)
Total Protein: 6.9 g/dL (ref 6.0–8.5)
eGFR: 112 mL/min/1.73 (ref >59)

## 2024-06-16 LAB — INSULIN, RANDOM: INSULIN: 6.6 u[IU]/mL (ref 2.6–24.9)

## 2024-06-16 LAB — VITAMIN D 25 HYDROXY (VIT D DEFICIENCY, FRACTURES): Vit D, 25-Hydroxy: 50.5 ng/mL (ref 30.0–100.0)

## 2024-06-19 ENCOUNTER — Encounter (INDEPENDENT_AMBULATORY_CARE_PROVIDER_SITE_OTHER): Payer: Self-pay | Admitting: Family Medicine

## 2024-06-19 NOTE — Assessment & Plan Note (Signed)
 Needs refill effusion plus today.  Will order CBC and anemia panel to assess patient's anemia status after iron  supplementation with fusion plus.

## 2024-06-19 NOTE — Assessment & Plan Note (Signed)
 Patient is tolerating tirzepatide  5 mg subcutaneous injection weekly.  She is still working on increasing her total p.o. intake every day.  Will continue current dose for now until patient is able to meet nutrition goals.

## 2024-06-19 NOTE — Assessment & Plan Note (Signed)
 Working on limiting simple carbohydrates daily as well as increasing total nutrition in the form of protein intake.  Patient is currently on pharmacotherapy to help with obesity but also will help control prediabetes.  Will hold metformin  in order CMP, A1c and insulin  level today.

## 2024-06-19 NOTE — Assessment & Plan Note (Signed)
 On prescription strength vitamin D  but still not at goal.  Will repeat vitamin D  level today to assess if patient needs a different formulation.

## 2024-06-20 ENCOUNTER — Other Ambulatory Visit (INDEPENDENT_AMBULATORY_CARE_PROVIDER_SITE_OTHER): Payer: Self-pay

## 2024-06-20 MED ORDER — TIRZEPATIDE-WEIGHT MANAGEMENT 5 MG/0.5ML ~~LOC~~ SOLN: 5.0000 mg | SUBCUTANEOUS | 0 refills | Status: DC

## 2024-07-18 NOTE — Telephone Encounter (Signed)
**Note De-identified  [place] Obfuscation** Please advise 

## 2024-07-20 ENCOUNTER — Encounter (INDEPENDENT_AMBULATORY_CARE_PROVIDER_SITE_OTHER): Payer: Self-pay | Admitting: Family Medicine

## 2024-07-20 ENCOUNTER — Ambulatory Visit (INDEPENDENT_AMBULATORY_CARE_PROVIDER_SITE_OTHER): Admitting: Family Medicine

## 2024-07-20 VITALS — BP 110/73 | HR 72 | Temp 97.9°F | Ht 68.0 in | Wt 264.0 lb

## 2024-07-20 DIAGNOSIS — E559 Vitamin D deficiency, unspecified: Secondary | ICD-10-CM

## 2024-07-20 DIAGNOSIS — R7303 Prediabetes: Secondary | ICD-10-CM

## 2024-07-20 DIAGNOSIS — Z6841 Body Mass Index (BMI) 40.0 and over, adult: Secondary | ICD-10-CM | POA: Diagnosis not present

## 2024-07-20 MED ORDER — METFORMIN HCL 500 MG PO TABS: 500.0000 mg | ORAL_TABLET | Freq: Every day | ORAL | 0 refills | Status: DC

## 2024-07-20 MED ORDER — VITAMIN D (ERGOCALCIFEROL) 1.25 MG (50000 UNIT) PO CAPS: 50000.0000 [IU] | ORAL_CAPSULE | ORAL | 0 refills | Status: DC

## 2024-07-20 MED ORDER — TIRZEPATIDE-WEIGHT MANAGEMENT 7.5 MG/0.5ML ~~LOC~~ SOLN: 7.5000 mg | SUBCUTANEOUS | 0 refills | Status: DC

## 2024-07-20 NOTE — Progress Notes (Signed)
 SUBJECTIVE:  Chief Complaint: Obesity  Interim History: Patient has been finding it difficult to get back into a routine of exercising and being consistent with it.  She mentions she has been struggling with motivation with getting her physical activity in during the evening because it is getting dark early.  She also is trying to find time to go visit her grandmother in a memory care unit.  She has been staying focused on her intake and getting her protein in.  She is trying to ensure she has nutritious snacks but does notice she can see a difference in her snack intake quantity. Nothing planned for Halloween. Her birthday is today and she is eating shrimp and salad.  She goes to her mother's and father's and is going to get together with family for Thanksgiving.   Isabella Larson is here to discuss her progress with her obesity treatment plan. She is on the Category 4 Plan and states she is following her eating plan approximately 60 % of the time. She states she is exercising 45 minutes 2 times per week.   OBJECTIVE: Visit Diagnoses: Problem List Items Addressed This Visit       Other   Prediabetes   Vitamin D  deficiency   Obesity with starting BMI of 46.3 - Primary    Vitals Temp: 97.9 F (36.6 C) BP: 110/73 Pulse Rate: 72 SpO2: 100 %   Anthropometric Measurements Height: 5' 8 (1.727 m) Weight: 264 lb (119.7 kg) BMI (Calculated): 40.15 Weight at Last Visit: 264lb Weight Lost Since Last Visit: 0lb Weight Gained Since Last Visit: 0lb Starting Weight: 305lb Total Weight Loss (lbs): 41 lb (18.6 kg)   Body Composition  Body Fat %: 50.4 % Fat Mass (lbs): 133.4 lbs Muscle Mass (lbs): 124.6 lbs Visceral Fat Rating : 14   Other Clinical Data Fasting: No Labs: No Today's Visit #: 36 Starting Date: 04/18/19 Comments: Cat 4     ASSESSMENT AND PLAN: Assessment & Plan Vitamin D  deficiency Patient needs refill of prescription strength vitamin D  at this time.  No nausea,  vomiting, or muscle weakness reported.  Recent vitamin D  level below goal at 50.5.  Will need to continue prescription strength vitamin D  weekly at this time. Prediabetes Improvement in A1c to 5.3 from 5.7.  Will hold metformin  at this time as patient is currently taking tirzepatide  which has significantly improved blood sugar control. Morbid obesity (HCC) Increase Zepbound  as below. BMI 40.0-44.9, adult (HCC)    Diet: [name] is currently in the action stage of change. As such, her goal is to continue with weight loss efforts and has agreed to the Category 4 Plan.   Exercise:  For substantial health benefits, adults should do at least 150 minutes (2 hours and 30 minutes) a week of moderate-intensity, or 75 minutes (1 hour and 15 minutes) a week of vigorous-intensity aerobic physical activity, or an equivalent combination of moderate- and vigorous-intensity aerobic activity. Aerobic activity should be performed in episodes of at least 10 minutes, and preferably, it should be spread throughout the week.  Behavior Modification:  We discussed the following Behavioral Modification Strategies today: increasing lean protein intake, decreasing simple carbohydrates, increasing vegetables, meal planning and cooking strategies, and planning for success. We discussed various medication options to help Isabella Larson with her weight loss efforts and we both agreed to increase zepbound  to 7.5mg  weekly to help with controlling cravings.  Return in about 5 weeks (around 08/24/2024).   She was informed of the importance of frequent  follow up visits to maximize her success with intensive lifestyle modifications for her multiple health conditions.  Attestation Statements:   Reviewed by clinician on day of visit: allergies, medications, problem list, medical history, surgical history, family history, social history, and previous encounter notes.     [name], MD

## 2024-07-24 NOTE — Assessment & Plan Note (Signed)
 Patient needs refill of prescription strength vitamin D  at this time.  No nausea, vomiting, or muscle weakness reported.  Recent vitamin D  level below goal at 50.5.  Will need to continue prescription strength vitamin D  weekly at this time.

## 2024-07-24 NOTE — Assessment & Plan Note (Signed)
 Increase Zepbound  as below.

## 2024-07-24 NOTE — Assessment & Plan Note (Signed)
 Improvement in A1c to 5.3 from 5.7.  Will hold metformin  at this time as patient is currently taking tirzepatide  which has significantly improved blood sugar control.

## 2024-07-25 ENCOUNTER — Encounter: Payer: Self-pay | Admitting: Radiology

## 2024-07-27 ENCOUNTER — Ambulatory Visit (INDEPENDENT_AMBULATORY_CARE_PROVIDER_SITE_OTHER): Payer: Self-pay | Admitting: Family Medicine

## 2024-08-17 ENCOUNTER — Encounter (INDEPENDENT_AMBULATORY_CARE_PROVIDER_SITE_OTHER): Payer: Self-pay | Admitting: Family Medicine

## 2024-08-17 ENCOUNTER — Ambulatory Visit (INDEPENDENT_AMBULATORY_CARE_PROVIDER_SITE_OTHER): Admitting: Family Medicine

## 2024-08-17 DIAGNOSIS — K9189 Other postprocedural complications and disorders of digestive system: Secondary | ICD-10-CM

## 2024-08-17 DIAGNOSIS — E559 Vitamin D deficiency, unspecified: Secondary | ICD-10-CM

## 2024-08-17 DIAGNOSIS — E038 Other specified hypothyroidism: Secondary | ICD-10-CM

## 2024-08-17 DIAGNOSIS — D508 Other iron deficiency anemias: Secondary | ICD-10-CM

## 2024-08-17 DIAGNOSIS — Z6838 Body mass index (BMI) 38.0-38.9, adult: Secondary | ICD-10-CM

## 2024-08-17 MED ORDER — TIRZEPATIDE-WEIGHT MANAGEMENT 7.5 MG/0.5ML ~~LOC~~ SOLN: 7.5000 mg | SUBCUTANEOUS | 0 refills | Status: DC

## 2024-08-17 MED ORDER — FUSION PLUS PO CAPS: 1.0000 | ORAL_CAPSULE | Freq: Every day | ORAL | 0 refills | Status: DC

## 2024-08-17 MED ORDER — VITAMIN D (ERGOCALCIFEROL) 1.25 MG (50000 UNIT) PO CAPS: 50000.0000 [IU] | ORAL_CAPSULE | ORAL | 0 refills | Status: AC

## 2024-08-17 MED ORDER — LEVOTHYROXINE SODIUM 200 MCG PO TABS: 200.0000 ug | ORAL_TABLET | Freq: Every day | ORAL | 0 refills | Status: DC

## 2024-08-17 NOTE — Progress Notes (Signed)
 SUBJECTIVE:  Chief Complaint: Obesity  Interim History: Patient has had a really good last month.  She has had one episode of feeling slightly queasy on the 7.5mg  zepbound .  She is feels the zepbound  has been helping keeping her intake controlled.  She has had a few celebrations already in prep for Thanksgiving.  She is feeling fuller faster than she did previously.  She is going to her parents house for Thanksgiving and is responsible for ham and corn pudding.  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  Isabella Larson is here to discuss her progress with her obesity treatment plan. She is on the Category 4 Plan and states she is following her eating plan approximately 75 % of the time. She states she is not exercising.            OBJECTIVE: Visit Diagnoses: Problem List Items Addressed This Visit   None   Vitals Temp: 98 F (36.7 C) BP: 114/60 Pulse Rate: 62 SpO2: 98 %   Anthropometric Measurements Height: 5' 8 (1.727 m) Weight: 256 lb (116.1 kg) BMI (Calculated): 38.93 Weight at Last Visit: 264 lb Weight Lost Since Last Visit: 8 Weight Gained Since Last Visit: 0 Starting Weight: 305 lb Total Weight Loss (lbs): 49 lb (22.2 kg)   Body Composition  Body Fat %: 47.5 % Fat Mass (lbs): 121.6 lbs Muscle Mass (lbs): 127.6 lbs Total Body Water (lbs): 102.6 lbs Visceral Fat Rating : 13   Other Clinical Data Today's Visit #: 48 Starting Date: 04/18/19 Comments: Cat 4     ASSESSMENT AND PLAN: Assessment & Plan 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  No GI side effects from this treatment.  Needs refill today.  Continue current dosage and follow-up on labs at next lab draw. Other specified hypothyroidism Tolerating levothyroxine  dosage and TSH within normal limits at last check.  Needs refill of 200 mcg daily.  With consistent weight loss with patient being on  Zepbound  will need to monitor TSH closely and make adjustments to levothyroxine  dosage pending weight loss. Vitamin D  deficiency Tolerating prescription strength vitamin D  and needs refill today.  No nausea, vomiting, or muscle weakness reported BMI 38.0-38.9,adult  Morbid obesity (HCC)    Diet: Annalyce is currently in the action stage of change. As such, her goal is to continue with weight loss efforts and has agreed to the Category 4 Plan.   Exercise:  For substantial health benefits, adults should do at least 150 minutes (2 hours and 30 minutes) a week of moderate-intensity, or 75 minutes (1 hour and 15 minutes) a week of vigorous-intensity aerobic physical activity, or an equivalent combination of moderate- and vigorous-intensity aerobic activity. Aerobic activity should be performed in episodes of at least 10 minutes, and preferably, it should be spread throughout the week.  Behavior Modification:  We discussed the following Behavioral Modification Strategies today: increasing lean protein intake, decreasing simple carbohydrates, increasing vegetables, meal planning and cooking strategies, and holiday eating strategies. We discussed various medication options to help Tiara with her weight loss efforts and we both agreed to continue Zepbound  at current dosage.  Follow-up in 4 - 6 weeks She was informed of the importance of frequent follow up visits to maximize her success with intensive lifestyle modifications for her multiple health conditions.  Attestation Statements:   Reviewed by clinician on day of visit: allergies, medications, problem list, medical history, surgical history,  family history, social history, and previous encounter notes.     [name], MD

## 2024-08-21 NOTE — Assessment & Plan Note (Signed)
 Patient has tolerated fusion plus as iron  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  No GI side effects from this treatment.  Needs refill today.  Continue current dosage and follow-up on labs at next lab draw.

## 2024-08-21 NOTE — Assessment & Plan Note (Signed)
 Tolerating prescription strength vitamin D  and needs refill today.  No nausea, vomiting, or muscle weakness reported.

## 2024-08-21 NOTE — Assessment & Plan Note (Signed)
 Tolerating levothyroxine  dosage and TSH within normal limits at last check.  Needs refill of 200 mcg daily.  With consistent weight loss with patient being on Zepbound  will need to monitor TSH closely and make adjustments to levothyroxine  dosage pending weight loss.

## 2024-09-13 ENCOUNTER — Encounter (INDEPENDENT_AMBULATORY_CARE_PROVIDER_SITE_OTHER): Payer: Self-pay | Admitting: Family Medicine

## 2024-09-13 ENCOUNTER — Ambulatory Visit (INDEPENDENT_AMBULATORY_CARE_PROVIDER_SITE_OTHER): Admitting: Family Medicine

## 2024-09-13 VITALS — BP 103/71 | HR 67 | Temp 98.0°F | Ht 68.0 in | Wt 259.0 lb

## 2024-09-13 DIAGNOSIS — Z6839 Body mass index (BMI) 39.0-39.9, adult: Secondary | ICD-10-CM

## 2024-09-13 DIAGNOSIS — D508 Other iron deficiency anemias: Secondary | ICD-10-CM

## 2024-09-13 DIAGNOSIS — R7303 Prediabetes: Secondary | ICD-10-CM | POA: Diagnosis not present

## 2024-09-13 DIAGNOSIS — K9189 Other postprocedural complications and disorders of digestive system: Secondary | ICD-10-CM | POA: Diagnosis not present

## 2024-09-13 MED ORDER — TIRZEPATIDE-WEIGHT MANAGEMENT 7.5 MG/0.5ML ~~LOC~~ SOLN: 7.5000 mg | SUBCUTANEOUS | 0 refills | Status: DC

## 2024-09-13 NOTE — Progress Notes (Signed)
 "  SUBJECTIVE:  Chief Complaint: Obesity  Interim History: Patient did really well with staying consistent of food intake and implementing physical activity into her regimen more consistently.  She mentions about a week ago she got sick with URI type symptoms.  She is finally starting to feel better.  She now has a small tickle in her throat and is feeling better overall.  During the week she was sick she was drinking more ginger ale and having soup.  During Thanksgiving she was doing well with staying mindful of her food intake.    Isabella Larson is here to discuss her progress with her obesity treatment plan. She is on the Category 4 Plan and states she is following her eating plan approximately 60 % of the time. She states she is exercising 50-60 minutes 3 times per week.   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   Prediabetes - Primary   Obesity with starting BMI of 46.3   Relevant Medications   tirzepatide  7.5 MG/0.5ML injection vial   Other Visit Diagnoses       BMI 39.0-39.9,adult           Vitals Temp: 98 F (36.7 C) BP: 103/71 Pulse Rate: 67 SpO2: 99 %   Anthropometric Measurements Height: 5' 8 (1.727 m) Weight: 259 lb (117.5 kg) BMI (Calculated): 39.39 Weight at Last Visit: 256 lb Weight Lost Since Last Visit: 0 Weight Gained Since Last Visit: 3 Starting Weight: 305 lb Total Weight Loss (lbs): 46 lb (20.9 kg)   Body Composition  Body Fat %: 47.9 % Fat Mass (lbs): 124.2 lbs Muscle Mass (lbs): 128.2 lbs Total Body Water (lbs): 106.2 lbs Visceral Fat Rating : 13   Other Clinical Data Today's Visit #: 57 Starting Date: 04/18/19 Comments: Cat 4     ASSESSMENT AND PLAN: Assessment & Plan Prediabetes Most recent A1c no longer in prediabetic range and at 5.3.  Patient stopped metformin  after this lab result.  Will continue to monitor A1c at next lab which will be in March  2026.  Continue tirzepatide  at this time. 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  Patient does not need refill of fusion plus. BMI 39.0-39.9,adult  Morbid obesity (HCC)    Diet: Isabella Larson is currently in the action stage of change. As such, her goal is to continue with weight loss efforts and has agreed to the Category 4 Plan.   Exercise:  For substantial health benefits, adults should do at least 150 minutes (2 hours and 30 minutes) a week of moderate-intensity, or 75 minutes (1 hour and 15 minutes) a week of vigorous-intensity aerobic physical activity, or an equivalent combination of moderate- and vigorous-intensity aerobic activity. Aerobic activity should be performed in episodes of at least 10 minutes, and preferably, it should be spread throughout the week.  Behavior Modification:  We discussed the following Behavioral Modification Strategies today: increasing lean protein intake, decreasing simple carbohydrates, meal planning and cooking strategies, keeping healthy foods in the home, and holiday eating strategies. We discussed various medication options to help Isabella Larson with her weight loss efforts and we both agreed to continue current dose of tirzepatide  at 7.5mg  weekly.  Return in about 4 weeks (around 10/11/2024).   She was informed of the importance of frequent follow up visits to  maximize her success with intensive lifestyle modifications for her multiple health conditions.  Attestation Statements:   Reviewed by clinician on day of visit: allergies, medications, problem list, medical history, surgical history, family history, social history, and previous encounter notes.  [name], MD "

## 2024-09-19 ENCOUNTER — Other Ambulatory Visit (INDEPENDENT_AMBULATORY_CARE_PROVIDER_SITE_OTHER): Payer: Self-pay | Admitting: Family Medicine

## 2024-09-19 NOTE — Assessment & Plan Note (Signed)
 Most recent A1c no longer in prediabetic range and at 5.3.  Patient stopped metformin  after this lab result.  Will continue to monitor A1c at next lab which will be in March 2026.  Continue tirzepatide  at this time.

## 2024-09-19 NOTE — Assessment & Plan Note (Signed)
 Patient's iron  deficiency is stable with usage of fusion plus and will not make any other changes in treatment at this time.  Patient does not need refill of fusion plus.

## 2024-10-19 ENCOUNTER — Encounter (INDEPENDENT_AMBULATORY_CARE_PROVIDER_SITE_OTHER): Payer: Self-pay | Admitting: Family Medicine

## 2024-10-19 ENCOUNTER — Ambulatory Visit (INDEPENDENT_AMBULATORY_CARE_PROVIDER_SITE_OTHER): Admitting: Family Medicine

## 2024-10-19 VITALS — BP 119/76 | HR 69 | Temp 98.3°F | Ht 68.0 in | Wt 250.0 lb

## 2024-10-19 DIAGNOSIS — E038 Other specified hypothyroidism: Secondary | ICD-10-CM

## 2024-10-19 DIAGNOSIS — K9189 Other postprocedural complications and disorders of digestive system: Secondary | ICD-10-CM | POA: Diagnosis not present

## 2024-10-19 DIAGNOSIS — Z6838 Body mass index (BMI) 38.0-38.9, adult: Secondary | ICD-10-CM

## 2024-10-19 DIAGNOSIS — D508 Other iron deficiency anemias: Secondary | ICD-10-CM

## 2024-10-19 MED ORDER — FUSION PLUS PO CAPS: 1.0000 | ORAL_CAPSULE | Freq: Every day | ORAL | 0 refills | Status: AC

## 2024-10-19 MED ORDER — TIRZEPATIDE-WEIGHT MANAGEMENT 7.5 MG/0.5ML ~~LOC~~ SOLN: 7.5000 mg | SUBCUTANEOUS | 0 refills | Status: AC

## 2024-10-19 MED ORDER — LEVOTHYROXINE SODIUM 200 MCG PO TABS: 200.0000 ug | ORAL_TABLET | Freq: Every day | ORAL | 0 refills | Status: AC

## 2024-10-19 NOTE — Progress Notes (Signed)
 "  SUBJECTIVE:  Chief Complaint: Obesity  Interim History: Patient here for first follow up since right before Christmas.  Patient had great holidays and voices she was busy throughout.  She ate a small amount of indulgent food and is really trying to retrain herself to know she can have a small amount of intake and be satisfied.  She is starting to recognize some unhealthy habits she is seeing in her family.  She has been going to the gym more frequently and is feeling really positive about this.  She is needing to get new jeans.  She has food on plan already stocked in her house.    Isabella Larson is here to discuss her progress with her obesity treatment plan. She is on the Category 4 Plan and states she is following her eating plan approximately 75 % of the time. She states she is exercising 50 minutes 3-4 times per week.   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   Relevant Medications   Iron -FA-B Cmp-C-Biot-Probiotic (FUSION PLUS) CAPS   Obesity with starting BMI of 46.3   Relevant Medications   tirzepatide  7.5 MG/0.5ML injection vial    Vitals Temp: 98.3 F (36.8 C) BP: 119/76 Pulse Rate: 69 SpO2: 99 %   Anthropometric Measurements Height: 5' 8 (1.727 m) Weight: 250 lb (113.4 kg) BMI (Calculated): 38.02 Weight at Last Visit: 259 lb Weight Lost Since Last Visit: 9 Weight Gained Since Last Visit: 0 Starting Weight: 305 lb Total Weight Loss (lbs): 55 lb (24.9 kg)   Body Composition  Body Fat %: 44.7 % Fat Mass (lbs): 112 lbs Muscle Mass (lbs): 131.4 lbs Total Body Water (lbs): 93.6 lbs Visceral Fat Rating : 12   Other Clinical Data Today's Visit #: 78 Starting Date: 04/18/19 Comments: Cat 4     ASSESSMENT AND PLAN: Assessment &  Plan 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  Last labs showing anemia panel and CBC both at goal and within normal limits.  Continue supplementation at this time. Other specified hypothyroidism Patient is on levothyroxine  200 mcg daily.  She reports no cold intolerance, heat intolerance, or palpitations.  Continue current dose of levothyroxine  and refill sent to pharmacy today. BMI 38.0-38.9,adult  Morbid obesity (HCC)    Diet: [place] is currently in the action stage of change. As such, her goal is to continue with weight loss efforts and has agreed to the Category 4 Plan.   Exercise:  For additional and more extensive health benefits, adults should increase their aerobic physical activity to 300 minutes (5 hours) a week of moderate-intensity, or 150 minutes a week of vigorous-intensity aerobic physical activity, or an equivalent combination of moderate- and vigorous-intensity activity. Additional health benefits are gained by engaging in physical activity beyond this amount.  and Adults should also include muscle-strengthening activities that involve all major muscle groups on 2 or more days a week.  Behavior Modification:  We discussed the following Behavioral Modification Strategies today: increasing lean protein intake, decreasing simple carbohydrates, increasing vegetables, meal planning and cooking strategies, keeping healthy foods in the home, and planning for success. We discussed various medication options to help Aleksa with her weight loss efforts and we both  agreed to continue Zepbound  at current dose.  Return in about 4 weeks (around 11/16/2024).   She was informed of the importance of frequent follow up visits to maximize her success with intensive lifestyle modifications for her multiple health conditions.  Attestation Statements:   Reviewed by clinician on day of visit: allergies,  medications, problem list, medical history, surgical history, family history, social history, and previous encounter notes.     [name], MD "

## 2024-10-24 NOTE — Assessment & Plan Note (Signed)
 Patient is tolerating fusion plus iron  replacement and needs a refill of this today.  Last labs showing anemia panel and CBC both at goal and within normal limits.  Continue supplementation at this time.

## 2024-11-16 ENCOUNTER — Ambulatory Visit (INDEPENDENT_AMBULATORY_CARE_PROVIDER_SITE_OTHER): Admitting: Family Medicine
